# Patient Record
Sex: Female | Born: 1945 | Race: Black or African American | Hispanic: No | Smoking: Never smoker
Health system: Southern US, Community
[De-identification: ages and names within clinical notes are randomized; demographics above are authoritative.]

## PROBLEM LIST (undated history)

## (undated) DIAGNOSIS — F419 Anxiety disorder, unspecified: Secondary | ICD-10-CM

## (undated) DIAGNOSIS — E785 Hyperlipidemia, unspecified: Secondary | ICD-10-CM

## (undated) DIAGNOSIS — I701 Atherosclerosis of renal artery: Secondary | ICD-10-CM

## (undated) DIAGNOSIS — I10 Essential (primary) hypertension: Secondary | ICD-10-CM

## (undated) DIAGNOSIS — M1990 Unspecified osteoarthritis, unspecified site: Secondary | ICD-10-CM

## (undated) HISTORY — DX: Hyperlipidemia, unspecified: E78.5

## (undated) HISTORY — DX: Essential (primary) hypertension: I10

---

## 1988-04-19 HISTORY — PX: ABDOMINAL HYSTERECTOMY: SHX81

## 2001-07-21 ENCOUNTER — Other Ambulatory Visit: Admission: RE | Admit: 2001-07-21 | Discharge: 2001-07-21 | Payer: Self-pay | Admitting: Family Medicine

## 2001-10-09 DIAGNOSIS — G4700 Insomnia, unspecified: Secondary | ICD-10-CM | POA: Insufficient documentation

## 2001-10-09 DIAGNOSIS — I10 Essential (primary) hypertension: Secondary | ICD-10-CM | POA: Insufficient documentation

## 2006-08-02 ENCOUNTER — Ambulatory Visit: Payer: Self-pay | Admitting: General Surgery

## 2007-02-21 DIAGNOSIS — E782 Mixed hyperlipidemia: Secondary | ICD-10-CM | POA: Insufficient documentation

## 2007-07-19 ENCOUNTER — Ambulatory Visit: Payer: Self-pay | Admitting: Internal Medicine

## 2009-07-29 DIAGNOSIS — J309 Allergic rhinitis, unspecified: Secondary | ICD-10-CM | POA: Insufficient documentation

## 2009-10-09 DIAGNOSIS — E559 Vitamin D deficiency, unspecified: Secondary | ICD-10-CM | POA: Insufficient documentation

## 2010-01-17 HISTORY — PX: CARPAL TUNNEL RELEASE: SHX101

## 2013-09-06 ENCOUNTER — Ambulatory Visit: Payer: Self-pay | Admitting: Family Medicine

## 2014-12-31 ENCOUNTER — Encounter: Payer: Self-pay | Admitting: Family Medicine

## 2014-12-31 ENCOUNTER — Ambulatory Visit (INDEPENDENT_AMBULATORY_CARE_PROVIDER_SITE_OTHER): Payer: Commercial Managed Care - HMO | Admitting: Family Medicine

## 2014-12-31 VITALS — BP 116/78 | HR 68 | Temp 98.5°F | Resp 16 | Ht 62.0 in | Wt 177.0 lb

## 2014-12-31 DIAGNOSIS — J3089 Other allergic rhinitis: Secondary | ICD-10-CM | POA: Diagnosis not present

## 2014-12-31 DIAGNOSIS — R739 Hyperglycemia, unspecified: Secondary | ICD-10-CM

## 2014-12-31 DIAGNOSIS — R7989 Other specified abnormal findings of blood chemistry: Secondary | ICD-10-CM | POA: Diagnosis not present

## 2014-12-31 DIAGNOSIS — G5600 Carpal tunnel syndrome, unspecified upper limb: Secondary | ICD-10-CM | POA: Insufficient documentation

## 2014-12-31 DIAGNOSIS — I10 Essential (primary) hypertension: Secondary | ICD-10-CM

## 2014-12-31 DIAGNOSIS — E782 Mixed hyperlipidemia: Secondary | ICD-10-CM | POA: Diagnosis not present

## 2014-12-31 DIAGNOSIS — H8110 Benign paroxysmal vertigo, unspecified ear: Secondary | ICD-10-CM | POA: Insufficient documentation

## 2014-12-31 NOTE — Progress Notes (Signed)
Subjective:    Patient ID: Marissa Hahn, female    DOB: [birth year], 69 y.o.   MRN: [id number]  Hypertension This is a chronic problem. The problem is controlled. Associated symptoms include malaise/fatigue. Pertinent negatives include no anxiety, blurred vision, chest pain, headaches, neck pain, orthopnea, palpitations, peripheral edema, shortness of breath or sweats. Treatments tried: Amlodipine and Ziac. There are no compliance problems.   Hyperlipidemia This is a chronic problem. Lipid results: 08/24/2013 Total- 170, Trig- 104, HDL- 45, LDL- 104. Pertinent negatives include no chest pain, focal sensory loss, focal weakness, leg pain, myalgias or shortness of breath. Current antihyperlipidemic treatment includes statins (Simvastatin 10). There are no compliance problems.   Allergic Rhinitis: Marissa Hahn is here for evaluation of possible allergic rhinitis. Patient's symptoms include cough, nasal congestion and sinus pressure. These symptoms are seasonal. Current triggers include exposure to weather changes. The patient has been suffering from these symptoms for approximately 10 years. The patient has tried over the counter medications with good relief of symptoms.  Does not think it needs to be adjusted.  Does not need medication.   No current concerns.      Review of Systems  Constitutional: Positive for malaise/fatigue. Negative for fever, chills, diaphoresis, activity change, appetite change, fatigue and unexpected weight change.  HENT: Positive for sinus pressure.   Eyes: Negative for blurred vision.  Respiratory: Positive for cough. Negative for shortness of breath.   Cardiovascular: Negative for chest pain, palpitations, orthopnea and leg swelling.  Musculoskeletal: Negative for myalgias and neck pain.  Allergic/Immunologic: Positive for environmental allergies.  Neurological: Negative for focal weakness and headaches.    Patient Active Problem List   Diagnosis Date  Noted  . Benign paroxysmal positional nystagmus 12/31/2014  . Carpal tunnel syndrome 12/31/2014  . Blood glucose elevated 12/31/2014  . Avitaminosis D 10/09/2009  . Allergic rhinitis 07/29/2009  . 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 BP (high blood pressure) 10/09/2001  . Cannot sleep 10/09/2001   No past medical history on file. No current outpatient prescriptions on file prior to visit.   No current facility-administered medications on file prior to visit.   Allergies  Allergen Reactions  . Acetaminophen-Codeine    Past Surgical History  Procedure Laterality Date  . Carpal tunnel release Left 01/2010  . Abdominal hysterectomy  1990    Abdominal   Social History   Social History  . Marital Status: Divorced    Spouse Name: N/A  . Number of Children: 2  . Years of Education: H/S   Occupational History  . Retired    Social History Main Topics  . Smoking status: Never Smoker   . Smokeless tobacco: Never Used  . Alcohol Use: Yes     Comment: Occasional Wine  . Drug Use: No  . Sexual Activity: Not on file   Other Topics Concern  . Not on file   Social History Narrative   Family History  Problem Relation Age of Onset  . Hypertension Mother   . Lung cancer Father   . Healthy Other          Objective:   Physical Exam  Constitutional: She is oriented to person, place, and time. She appears well-developed and well-nourished.  Cardiovascular: Normal rate and regular rhythm.   Pulmonary/Chest: Effort normal and breath sounds normal.  Neurological: She is alert and oriented to person, place, and time.  Psychiatric: She has a normal mood and affect. Her behavior is normal. Judgment  and thought content normal.   BP 116/78 mmHg  Pulse 68  Temp(Src) 98.5 F (36.9 C) (Oral)  Resp 16  Ht 5\' 2"  (1.575 m)  Wt 177 lb (80.287 kg)  BMI 32.37 kg/m2      Assessment & Plan:  1. Essential hypertension Condition is stable. Please continue  current medication and  plan of care as noted.  Will check labs. She does not think medication is causing her fatigue.  - CBC with Differential/Platelet - TSH  2. Blood glucose elevated Due for recheck.  - Hemoglobin A1c  3. Combined fat and carbohydrate induced hyperlipemia Condition is stable. Please continue current medication and  plan of care as noted.  Also needs labs.  - Lipid panel - Comprehensive metabolic panel  4. Other allergic rhinitis Hopefully will improve now that has restarted all medication.    5. Low serum vitamin D Will check lever. Unclear if felt better on medication.  - Vit D  25 hydroxy (rtn osteoporosis monitoring)   Patient was seen and examined by Leo Grosser, MD, and note scribed, in part,  by Allene Dillon, CMA and reviewed with patient.  I have reviewed the document for accuracy and completeness and I agree with above. Leo Grosser, MD   Lorie Phenix, MD

## 2015-01-03 DIAGNOSIS — E782 Mixed hyperlipidemia: Secondary | ICD-10-CM | POA: Diagnosis not present

## 2015-01-03 DIAGNOSIS — R739 Hyperglycemia, unspecified: Secondary | ICD-10-CM | POA: Diagnosis not present

## 2015-01-03 DIAGNOSIS — R7989 Other specified abnormal findings of blood chemistry: Secondary | ICD-10-CM | POA: Diagnosis not present

## 2015-01-03 DIAGNOSIS — I10 Essential (primary) hypertension: Secondary | ICD-10-CM | POA: Diagnosis not present

## 2015-01-04 LAB — LIPID PANEL
CHOLESTEROL TOTAL: 181 mg/dL (ref 100–199)
Chol/HDL Ratio: 3.5 ratio units (ref 0.0–4.4)
HDL: 52 mg/dL (ref >39)
LDL Calculated: 112 mg/dL — ABNORMAL HIGH (ref 0–99)
Triglycerides: 87 mg/dL (ref 0–149)
VLDL CHOLESTEROL CAL: 17 mg/dL (ref 5–40)

## 2015-01-04 LAB — CBC WITH DIFFERENTIAL/PLATELET
BASOS ABS: 0 10*3/uL (ref 0.0–0.2)
Basos: 0 %
EOS (ABSOLUTE): 0.3 10*3/uL (ref 0.0–0.4)
Eos: 3 %
HEMOGLOBIN: 12.6 g/dL (ref 11.1–15.9)
Hematocrit: 39 % (ref 34.0–46.6)
Immature Grans (Abs): 0 10*3/uL (ref 0.0–0.1)
Immature Granulocytes: 0 %
LYMPHS ABS: 3.9 10*3/uL — AB (ref 0.7–3.1)
Lymphs: 39 %
MCH: 27 pg (ref 26.6–33.0)
MCHC: 32.3 g/dL (ref 31.5–35.7)
MCV: 84 fL (ref 79–97)
MONOCYTES: 7 %
Monocytes Absolute: 0.7 10*3/uL (ref 0.1–0.9)
Neutrophils Absolute: 5.2 10*3/uL (ref 1.4–7.0)
Neutrophils: 51 %
PLATELETS: 378 10*3/uL (ref 150–379)
RBC: 4.67 x10E6/uL (ref 3.77–5.28)
RDW: 13.8 % (ref 12.3–15.4)
WBC: 10.1 10*3/uL (ref 3.4–10.8)

## 2015-01-04 LAB — COMPREHENSIVE METABOLIC PANEL
ALBUMIN: 4 g/dL (ref 3.6–4.8)
ALK PHOS: 92 IU/L (ref 39–117)
ALT: 13 IU/L (ref 0–32)
AST: 17 IU/L (ref 0–40)
Albumin/Globulin Ratio: 1.3 (ref 1.1–2.5)
BUN / CREAT RATIO: 11 (ref 11–26)
BUN: 8 mg/dL (ref 8–27)
Bilirubin Total: 0.3 mg/dL (ref 0.0–1.2)
CO2: 22 mmol/L (ref 18–29)
CREATININE: 0.74 mg/dL (ref 0.57–1.00)
Calcium: 9.6 mg/dL (ref 8.7–10.3)
Chloride: 101 mmol/L (ref 97–108)
GFR calc non Af Amer: 85 mL/min/{1.73_m2} (ref >59)
GFR, EST AFRICAN AMERICAN: 98 mL/min/{1.73_m2} (ref >59)
GLOBULIN, TOTAL: 3.1 g/dL (ref 1.5–4.5)
GLUCOSE: 104 mg/dL — AB (ref 65–99)
Potassium: 4 mmol/L (ref 3.5–5.2)
SODIUM: 140 mmol/L (ref 134–144)
TOTAL PROTEIN: 7.1 g/dL (ref 6.0–8.5)

## 2015-01-04 LAB — VITAMIN D 25 HYDROXY (VIT D DEFICIENCY, FRACTURES): VIT D 25 HYDROXY: 17 ng/mL — AB (ref 30.0–100.0)

## 2015-01-04 LAB — HEMOGLOBIN A1C
ESTIMATED AVERAGE GLUCOSE: 128 mg/dL
Hgb A1c MFr Bld: 6.1 % — ABNORMAL HIGH (ref 4.8–5.6)

## 2015-01-04 LAB — TSH: TSH: 2.24 u[IU]/mL (ref 0.450–4.500)

## 2015-01-06 ENCOUNTER — Telehealth: Payer: Self-pay

## 2015-01-06 NOTE — Telephone Encounter (Signed)
-----   Message from Lorie Phenix, MD sent at 01/05/2015  8:18 AM EDT ----- Labs stable. HgbA1c is slightly above normal, eat healthy and  Cholesterol looks good. Only abnormality is low Vitamin D.  Please clarify if she is taking any supplements. Thanks.

## 2015-01-06 NOTE — Telephone Encounter (Signed)
Vit D 2000 iu daily. Thanks.

## 2015-01-06 NOTE — Telephone Encounter (Signed)
Pt advise;  She is not taking any Vitamin D supplements.  How many units do you want her to start on?  Thanks,   -Vernona Rieger

## 2015-01-07 NOTE — Telephone Encounter (Signed)
Pt advised.   Thanks,   -Laura  

## 2015-03-25 ENCOUNTER — Ambulatory Visit (INDEPENDENT_AMBULATORY_CARE_PROVIDER_SITE_OTHER): Payer: Commercial Managed Care - HMO | Admitting: Family Medicine

## 2015-03-25 ENCOUNTER — Encounter: Payer: Self-pay | Admitting: Family Medicine

## 2015-03-25 VITALS — BP 138/70 | HR 64 | Temp 98.8°F | Resp 16 | Ht 62.0 in | Wt 183.0 lb

## 2015-03-25 DIAGNOSIS — Z23 Encounter for immunization: Secondary | ICD-10-CM | POA: Diagnosis not present

## 2015-03-25 DIAGNOSIS — Z1239 Encounter for other screening for malignant neoplasm of breast: Secondary | ICD-10-CM | POA: Diagnosis not present

## 2015-03-25 DIAGNOSIS — Z0000 Encounter for general adult medical examination without abnormal findings: Secondary | ICD-10-CM

## 2015-03-25 NOTE — Progress Notes (Signed)
Patient ID: Marissa Hahn, female   DOB: 08-29-1945, 69 y.o.   MRN: [id number]         [name], Female    DOB: 07/23/1945, 69 y.o.   MRN: [id number] Visit Date: 03/25/2015  Today's Provider: Lorie Phenix, MD   Chief Complaint  Patient presents with  . Annual Exam   Subjective:    Annual wellness visit Marissa Hahn is a 69 y.o. female. She feels well. She reports exercising occasionally. She reports she is sleeping fairly well.  Does need her pneumonia shot today. Chronic problems stable.   -----------------------------------------------------------   Review of Systems  Constitutional: Negative.   HENT: Negative for congestion, dental problem, drooling, ear discharge, ear pain, facial swelling, hearing loss, mouth sores, nosebleeds, postnasal drip, rhinorrhea, sinus pressure, sneezing, sore throat, tinnitus, trouble swallowing and voice change.   Eyes: Negative.   Respiratory: Negative.   Cardiovascular: Negative.   Endocrine: Negative.   Genitourinary: Negative.   Musculoskeletal: Positive for myalgias and arthralgias. Negative for back pain, joint swelling, gait problem, neck pain and neck stiffness.  Skin: Negative.   Allergic/Immunologic: Negative.   Neurological: Positive for headaches. Negative for dizziness, tremors, seizures, syncope, facial asymmetry, speech difficulty, weakness, light-headedness and numbness.  Hematological: Negative.   Psychiatric/Behavioral: Positive for sleep disturbance. Negative for suicidal ideas, hallucinations, behavioral problems, confusion, self-injury, dysphoric mood, decreased concentration and agitation. The patient is not nervous/anxious and is not hyperactive.     Social History   Social History  . Marital Status: Divorced    Spouse Name: N/A  . Number of Children: 2  . Years of Education: H/S   Occupational History  . Retired    Social History Main Topics  . Smoking status: Never Smoker   .  Smokeless tobacco: Never Used  . Alcohol Use: Yes     Comment: Occasional Wine  . Drug Use: No  . Sexual Activity: Not on file   Other Topics Concern  . Not on file   Social History Narrative    Patient Active Problem List   Diagnosis Date Noted  . Benign paroxysmal positional nystagmus 12/31/2014  . Carpal tunnel syndrome 12/31/2014  . Blood glucose elevated 12/31/2014  . Avitaminosis D 10/09/2009  . Allergic rhinitis 07/29/2009  . 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 BP (high blood pressure) 10/09/2001  . Cannot sleep 10/09/2001    Past Surgical History  Procedure Laterality Date  . Carpal tunnel release Left 01/2010  . Abdominal hysterectomy  1990    Abdominal    Her family history includes Healthy in her other; Hypertension in her mother; Lung cancer in her father.    Previous Medications   AMLODIPINE (NORVASC) 5 MG TABLET    Take by mouth.   BISOPROLOL-HYDROCHLOROTHIAZIDE (ZIAC) 10-6.25 MG PER TABLET    Take by mouth.   FLUTICASONE (FLONASE) 50 MCG/ACT NASAL SPRAY    Place into the nose.   LORATADINE (CLARITIN) 10 MG TABLET    Take 10 mg by mouth daily.   MELOXICAM (MOBIC) 15 MG TABLET    Take by mouth.   SIMVASTATIN (ZOCOR) 10 MG TABLET    Take by mouth.   ZOLPIDEM (AMBIEN) 5 MG TABLET        Patient Care Team: Lorie Phenix, MD as PCP - General (Family Medicine)     Objective:   Vitals: BP 138/70 mmHg  Pulse 64  Temp(Src) 98.8 F (37.1 C) (Oral)  Resp 16  Ht 5'  2" (1.575 m)  Wt 183 lb (83.008 kg)  BMI 33.46 kg/m2  Physical Exam  Constitutional: She is oriented to person, place, and time. She appears well-developed and well-nourished.  HENT:  Head: Normocephalic and atraumatic.  Right Ear: Tympanic membrane, external ear and ear canal normal.  Left Ear: Tympanic membrane, external ear and ear canal normal.  Nose: Nose normal.  Mouth/Throat: Uvula is midline, oropharynx is clear and moist and mucous membranes are  normal.  Eyes: Conjunctivae, EOM and lids are normal. Pupils are equal, round, and reactive to light.  Neck: Trachea normal and normal range of motion. Neck supple. Carotid bruit is not present. No thyroid mass and no thyromegaly present.  Cardiovascular: Normal rate, regular rhythm and normal heart sounds.   Pulmonary/Chest: Effort normal and breath sounds normal.  Abdominal: Soft. Normal appearance and bowel sounds are normal. There is no hepatosplenomegaly. There is no tenderness.  Genitourinary: Guaiac negative stool. No breast swelling, tenderness or discharge.  Musculoskeletal: Normal range of motion.  Lymphadenopathy:    She has no cervical adenopathy.    She has no axillary adenopathy.  Neurological: She is alert and oriented to person, place, and time. She has normal strength. No cranial nerve deficit.  Skin: Skin is warm, dry and intact.  Psychiatric: She has a normal mood and affect. Her speech is normal and behavior is normal. Judgment and thought content normal. Cognition and memory are normal.    Activities of Daily Living In your present state of health, do you have any difficulty performing the following activities: 03/25/2015  Hearing? N  Vision? N  Difficulty concentrating or making decisions? N  Walking or climbing stairs? N  Dressing or bathing? N  Doing errands, shopping? N    Fall Risk Assessment Fall Risk  03/25/2015  Falls in the past year? No     Depression Screen PHQ 2/9 Scores 03/25/2015  PHQ - 2 Score 1    Cognitive Testing - 6-CIT  Correct? Score   What year is it? yes 0 0 or 4  What month is it? yes 0 0 or 3  Memorize:    Floyde Parkins,  44,  [address],  [place],      What time is it? (within 1 hour) yes 0 0 or 3  Count backwards from 20 no 3 0, 2, or 4  Name the months of the year yes 0 0, 2, or 4  Repeat name & address above yes 0 0, 2, 4, 6, 8, or 10       TOTAL SCORE  3/28   Interpretation:  Normal  Normal (0-7) Abnormal (8-28)        Assessment & Plan:     Annual Wellness Visit  Reviewed patient's Family Medical History Reviewed and updated list of patient's medical providers Assessment of cognitive impairment was done Assessed patient's functional ability Established a written schedule for health screening services Health Risk Assessent Completed and Reviewed  Exercise Activities and Dietary recommendations Goals    None      Immunization History  Administered Date(s) Administered  . Pneumococcal Conjugate-13 08/24/2013  . Pneumococcal Polysaccharide-23 03/25/2015    Health Maintenance  Topic Date Due  . Hepatitis C Screening  Feb 20, 1946  . TETANUS/TDAP  02/25/1967  . MAMMOGRAM  02/24/1998  . COLONOSCOPY  02/24/1998  . ZOSTAVAX  02/25/2008  . DEXA SCAN  02/24/2013  . PNA vac Low Risk Adult (2 of 2 - PPSV23) 08/25/2014  . INFLUENZA VACCINE  03/24/2016 (Originally 11/18/2014)      Discussed health benefits of physical activity, and encouraged her to engage in regular exercise appropriate for her age and condition.   1. Medicare annual wellness visit, subsequent Stable. Continue medication for chronic problems.  Vaccine given today.   - Pneumococcal polysaccharide vaccine 23-valent greater than or equal to 2yo subcutaneous/IM  2. Breast cancer screening Patient will call. - MM Digital Screening  Patient was seen and examined by Leo GrosserNancy J. Corinda Ammon, MD, and note scribed by Kavin LeechLaura Walsh, CMA.  I have reviewed the document for accuracy and completeness and I agree with above. Leo Grosser- Irys Nigh J. Devonte Migues, MD   Lorie PhenixNancy Edra Riccardi, MD    ------------------------------------------------------------------------------------------------------------

## 2015-04-04 ENCOUNTER — Other Ambulatory Visit: Payer: Self-pay | Admitting: Family Medicine

## 2015-04-04 DIAGNOSIS — G4700 Insomnia, unspecified: Secondary | ICD-10-CM

## 2015-04-04 MED ORDER — ZOLPIDEM TARTRATE 5 MG PO TABS: 5.0000 mg | ORAL_TABLET | Freq: Every evening | ORAL | Status: DC | PRN

## 2015-04-04 NOTE — Telephone Encounter (Signed)
Pt contacted office for refill request on the following medications:  zolpidem (AMBIEN) 5 MG tablet.   [facility] mail order.  CB#[phone]

## 2015-04-04 NOTE — Addendum Note (Signed)
Addended by: Kavin LeechWALSH, Niveah Boerner E on: 04/04/2015 02:18 PM   Modules accepted: Orders

## 2015-04-04 NOTE — Telephone Encounter (Signed)
Printed, please fax or call in to pharmacy. Thank you.   

## 2015-09-22 ENCOUNTER — Ambulatory Visit: Payer: Self-pay | Admitting: Family Medicine

## 2015-09-26 ENCOUNTER — Ambulatory Visit (INDEPENDENT_AMBULATORY_CARE_PROVIDER_SITE_OTHER): Payer: Commercial Managed Care - HMO | Admitting: Family Medicine

## 2015-09-26 ENCOUNTER — Encounter: Payer: Self-pay | Admitting: Family Medicine

## 2015-09-26 VITALS — BP 124/70 | HR 64 | Temp 98.3°F | Resp 16 | Ht 60.5 in | Wt 182.0 lb

## 2015-09-26 DIAGNOSIS — I10 Essential (primary) hypertension: Secondary | ICD-10-CM

## 2015-09-26 DIAGNOSIS — E782 Mixed hyperlipidemia: Secondary | ICD-10-CM

## 2015-09-26 DIAGNOSIS — R739 Hyperglycemia, unspecified: Secondary | ICD-10-CM

## 2015-09-26 DIAGNOSIS — G5603 Carpal tunnel syndrome, bilateral upper limbs: Secondary | ICD-10-CM

## 2015-09-26 DIAGNOSIS — G4700 Insomnia, unspecified: Secondary | ICD-10-CM

## 2015-09-26 LAB — POCT GLYCOSYLATED HEMOGLOBIN (HGB A1C)
Est. average glucose Bld gHb Est-mCnc: 123
Hemoglobin A1C: 5.9

## 2015-09-26 MED ORDER — MELOXICAM 7.5 MG PO TABS: 7.5000 mg | ORAL_TABLET | Freq: Every day | ORAL | Status: DC

## 2015-09-26 MED ORDER — SIMVASTATIN 10 MG PO TABS
10.0000 mg | ORAL_TABLET | Freq: Every day | ORAL | Status: DC
Start: 2015-09-26 — End: 2016-08-23

## 2015-09-26 MED ORDER — BISOPROLOL-HYDROCHLOROTHIAZIDE 10-6.25 MG PO TABS: 1.0000 | ORAL_TABLET | Freq: Every day | ORAL | Status: DC

## 2015-09-26 MED ORDER — AMLODIPINE BESYLATE 5 MG PO TABS: 5.0000 mg | ORAL_TABLET | Freq: Every day | ORAL | Status: DC

## 2015-09-26 MED ORDER — ZOLPIDEM TARTRATE 5 MG PO TABS: 5.0000 mg | ORAL_TABLET | Freq: Every evening | ORAL | Status: DC | PRN

## 2015-09-26 NOTE — Progress Notes (Signed)
Patient ID: Marissa Hahn, female   DOB: [birth year], 70 y.o.   MRN: [id number]       [name] Female    DOB: 08-Nov-1945   70 y.o.   MRN: [id number] Visit Date: 09/26/2015  Today's Provider: Lorie Phenix, MD   Chief Complaint  Patient presents with  . Hyperglycemia  . Hypertension  . Hyperlipidemia   Subjective:    HPI  Elevated blood sugar:  Lab Results  Component Value Date   HGBA1C 5.9 09/26/2015   HGBA1C 6.1* 01/03/2015   Last seen for diabetes 9 months ago.  Management since then includes no changes. Current symptoms include none and have been stable. Home blood sugar records: fasting range: not being checked  Episodes of hypoglycemia? no   Most Recent Eye Exam: up to date Weight trend: stable Prior visit with dietician: no Current diet: in general, a "healthy" diet   Current exercise: walking  ------------------------------------------------------------------------   Hypertension, follow-up:  BP Readings from Last 3 Encounters:  09/26/15 124/70  03/25/15 138/70  12/31/14 116/78    She was last seen for hypertension 9 months ago.  BP at that visit was 138/70. Management since that visit includes no changes .She reports excellent compliance with treatment. She is not having side effects. She is exercising. She is adherent to low salt diet.   Outside blood pressures are stable. She is experiencing none.  Patient denies chest pain.   Cardiovascular risk factors include advanced age (older than 77 for men, 70 for women).  Use of agents associated with hypertension: none.   ------------------------------------------------------------------------    Lipid/Cholesterol, Follow-up:   Last seen for this 3 months ago.  Management since that visit includes no changes.  Last Lipid Panel:    Component Value Date/Time   CHOL 181 01/03/2015 0907   TRIG 87 01/03/2015 0907   HDL 52 01/03/2015 0907   CHOLHDL 3.5 01/03/2015 0907   LDLCALC 112* 01/03/2015 0907    She reports excellent compliance with treatment. She is not having side effects.  Wt Readings from Last 3 Encounters:  09/26/15 182 lb (82.555 kg)  03/25/15 183 lb (83.008 kg)  12/31/14 177 lb (80.287 kg)   ------------------------------------------------------------------------  Hand pain: Patient reports bilateral pain worsening. Patient reports that she was unable to take meloxicam 15 mg. Patient reports that she takes OTC Tylenol as needed.     Allergies  Allergen Reactions  . Acetaminophen-Codeine    Current Meds  Medication Sig  . amLODipine (NORVASC) 5 MG tablet Take 5 mg by mouth daily.   . bisoprolol-hydrochlorothiazide (ZIAC) 10-6.25 MG per tablet Take 1 tablet by mouth daily.   . fluticasone (FLONASE) 50 MCG/ACT nasal spray Place 2 sprays into the nose daily.   Marland Kitchen loratadine (CLARITIN) 10 MG tablet Take 10 mg by mouth daily as needed.   . simvastatin (ZOCOR) 10 MG tablet Take 10 mg by mouth daily at 6 PM.   . Vitamin D, Cholecalciferol, 1000 units CAPS Take 1 capsule by mouth daily.  Marland Kitchen zolpidem (AMBIEN) 5 MG tablet Take 1 tablet (5 mg total) by mouth at bedtime as needed for sleep.    Review of Systems  Constitutional: Negative.   Cardiovascular: Negative.   Endocrine: Negative.   Musculoskeletal: Positive for arthralgias.    Social History  Substance Use Topics  . Smoking status: Never Smoker   . Smokeless tobacco: Never Used  . Alcohol Use: Yes     Comment: Occasional Wine   Objective:  BP 124/70 mmHg  Pulse 64  Temp(Src) 98.3 F (36.8 C) (Oral)  Resp 16  Ht 5' 0.5" (1.537 m)  Wt 182 lb (82.555 kg)  BMI 34.95 kg/m2  Physical Exam  Constitutional: She is oriented to person, place, and time. She appears well-developed and well-nourished.  Cardiovascular: Normal rate and regular rhythm.   Pulmonary/Chest: Effort normal and breath sounds normal.  Musculoskeletal:  No joint erythema noted.   Neurological: She is  alert and oriented to person, place, and time.  Psychiatric: She has a normal mood and affect. Her behavior is normal. Judgment and thought content normal.      Assessment & Plan:     1. Blood glucose elevated Stable. Continue to eat healthy and exercise.   - POCT glycosylated hemoglobin (Hb A1C)  2. Essential hypertension Condition is stable. Please continue current medication and  plan of care as noted.   - bisoprolol-hydrochlorothiazide (ZIAC) 10-6.25 MG tablet; Take 1 tablet by mouth daily.  Dispense: 90 tablet; Refill: 3 - amLODipine (NORVASC) 5 MG tablet; Take 1 tablet (5 mg total) by mouth daily.  Dispense: 90 tablet; Refill: 3  3. Combined fat and carbohydrate induced hyperlipemia Condition is stable. Please continue current medication and  plan of care as noted.   - simvastatin (ZOCOR) 10 MG tablet; Take 1 tablet (10 mg total) by mouth daily at 6 PM.  Dispense: 90 tablet; Refill: 3  4. Cannot sleep Condition is stable. Please continue current medication and  plan of care as noted.   - zolpidem (AMBIEN) 5 MG tablet; Take 1 tablet (5 mg total) by mouth at bedtime as needed for sleep.  Dispense: 90 tablet; Refill: 1  5. Bilateral carpal tunnel syndrome Could not tolerate 15 mg, tylenol makes her itchy. Will try bid as needed. Patient instructed to call back if condition worsens or does not improve.    - meloxicam (MOBIC) 7.5 MG tablet; Take 1 tablet (7.5 mg total) by mouth daily.  Dispense: 90 tablet; Refill: 3     Patient seen and examined by Dr. Leo GrosserNancy J.. Stefanos Haynesworth, and note scribed by Liz BeachSulibeya S. Dimas, CMA.  I have reviewed the document for accuracy and completeness and I agree with above. - Leo GrosserNancy J. Avi Archuleta, MD   Lorie PhenixNancy Analissa Bayless, MD  [facility]

## 2015-09-29 ENCOUNTER — Other Ambulatory Visit: Payer: Self-pay | Admitting: Family Medicine

## 2015-09-29 DIAGNOSIS — G5603 Carpal tunnel syndrome, bilateral upper limbs: Secondary | ICD-10-CM

## 2015-09-29 DIAGNOSIS — G4700 Insomnia, unspecified: Secondary | ICD-10-CM

## 2015-09-29 MED ORDER — ZOLPIDEM TARTRATE 5 MG PO TABS: 5.0000 mg | ORAL_TABLET | Freq: Every evening | ORAL | Status: DC | PRN

## 2015-09-29 MED ORDER — MELOXICAM 7.5 MG PO TABS: 7.5000 mg | ORAL_TABLET | Freq: Every day | ORAL | Status: DC

## 2015-09-29 MED ORDER — FLUTICASONE PROPIONATE 50 MCG/ACT NA SUSP: 2.0000 | Freq: Every day | NASAL | Status: DC

## 2015-09-29 NOTE — Telephone Encounter (Signed)
meloxicam (MOBIC) 7.5 MG tablet fluticasone (FLONASE) 50 MCG/ACT nasal spray zolpidem (AMBIEN) 5 MG tablet Pt states the above 3 meds were not called in for refill to the [facility]

## 2015-09-29 NOTE — Telephone Encounter (Signed)
Printed, please fax or call in to pharmacy. Thank you.   

## 2016-03-02 ENCOUNTER — Other Ambulatory Visit: Payer: Self-pay | Admitting: Family Medicine

## 2016-03-02 DIAGNOSIS — G4700 Insomnia, unspecified: Secondary | ICD-10-CM

## 2016-03-29 ENCOUNTER — Ambulatory Visit: Payer: Self-pay | Admitting: Physician Assistant

## 2016-04-07 ENCOUNTER — Ambulatory Visit: Payer: Commercial Managed Care - HMO | Admitting: Physician Assistant

## 2016-04-27 ENCOUNTER — Encounter: Payer: Self-pay | Admitting: Physician Assistant

## 2016-04-27 ENCOUNTER — Ambulatory Visit (INDEPENDENT_AMBULATORY_CARE_PROVIDER_SITE_OTHER): Payer: Medicare HMO | Admitting: Physician Assistant

## 2016-04-27 VITALS — BP 142/80 | HR 60 | Temp 98.3°F | Resp 16 | Ht 62.0 in | Wt 185.0 lb

## 2016-04-27 DIAGNOSIS — I10 Essential (primary) hypertension: Secondary | ICD-10-CM

## 2016-04-27 DIAGNOSIS — E782 Mixed hyperlipidemia: Secondary | ICD-10-CM

## 2016-04-27 DIAGNOSIS — R739 Hyperglycemia, unspecified: Secondary | ICD-10-CM

## 2016-04-27 LAB — POCT GLYCOSYLATED HEMOGLOBIN (HGB A1C)
Est. average glucose Bld gHb Est-mCnc: 123
Hemoglobin A1C: 5.9

## 2016-04-27 NOTE — Progress Notes (Signed)
[name] Female    DOB: [birth year]   71 y.o.   MRN: [id number] Visit Date: 04/27/2016  Today's Provider: Margaretann Loveless, PA-C   Chief Complaint  Patient presents with  . Hypertension   Subjective:    HPI Patient here to establish care with [name], PA-C, last ov visit was on 09/26/15 with Dr. Elease Hashimoto.   Hypertension, follow-up:  BP Readings from Last 3 Encounters:  04/27/16 (!) 142/80  09/26/15 124/70  03/25/15 138/70    She was last seen for hypertension 7 months ago.  BP at that visit was 124/70. Management changes since that visit include checking labs. She reports excellent compliance with treatment. She is not having side effects.  She is not exercising. She is adherent to low salt diet.   Outside blood pressures are stable. She is experiencing none.  Patient denies chest pain and lower extremity edema.   Cardiovascular risk factors include advanced age (older than 48 for men, 64 for women) and hypertension.  Use of agents associated with hypertension: none.     Weight trend: stable Wt Readings from Last 3 Encounters:  04/27/16 185 lb (83.9 kg)  09/26/15 182 lb (82.6 kg)  03/25/15 183 lb (83 kg)    Current diet: in general, a "healthy" diet    ------------------------------------------------------------------------      Lipid/Cholesterol, Follow-up:   Last seen for this7 months ago.  Management changes since that visit include no changes. . Last Lipid Panel:    Component Value Date/Time   CHOL 181 01/03/2015 0907   TRIG 87 01/03/2015 0907   HDL 52 01/03/2015 0907   CHOLHDL 3.5 01/03/2015 0907   LDLCALC 112 (H) 01/03/2015 0907    Risk factors for vascular disease include hypercholesterolemia and hypertension  She reports excellent compliance with treatment. She is not having side effects.  Current symptoms include none and have been stable. Weight trend: stable Prior visit with dietician: no Current  diet: in general, a "healthy" diet   Current exercise: none  Wt Readings from Last 3 Encounters:  04/27/16 185 lb (83.9 kg)  09/26/15 182 lb (82.6 kg)  03/25/15 183 lb (83 kg)  -------------------------------------------------------------------   Prediabetes, Follow-up:   Lab Results  Component Value Date   HGBA1C 5.9 09/26/2015   HGBA1C 6.1 (H) 01/03/2015   GLUCOSE 104 (H) 01/03/2015    Last seen for for this7 months ago.  Management since that visit includes no changes. Current symptoms include none and have been stable.  Weight trend: stable Prior visit with dietician: no Current diet: in general, a "healthy" diet   Current exercise: none  Pertinent Labs:    Component Value Date/Time   CHOL 181 01/03/2015 0907   TRIG 87 01/03/2015 0907   CHOLHDL 3.5 01/03/2015 0907   CREATININE 0.74 01/03/2015 0907    Wt Readings from Last 3 Encounters:  04/27/16 185 lb (83.9 kg)  09/26/15 182 lb (82.6 kg)  03/25/15 183 lb (83 kg)     Allergies  Allergen Reactions  . Acetaminophen-Codeine      Current Outpatient Prescriptions:  .  amLODipine (NORVASC) 5 MG tablet, Take 1 tablet (5 mg total) by mouth daily., Disp: 90 tablet, Rfl: 3 .  bisoprolol-hydrochlorothiazide (ZIAC) 10-6.25 MG tablet, Take 1 tablet by mouth daily., Disp: 90 tablet, Rfl: 3 .  fluticasone (FLONASE) 50 MCG/ACT nasal spray, Place 2 sprays into both nostrils daily., Disp: 48 g, Rfl: 3 .  loratadine (CLARITIN) 10 MG  tablet, Take 10 mg by mouth daily as needed. , Disp: , Rfl:  .  meloxicam (MOBIC) 7.5 MG tablet, Take 1 tablet (7.5 mg total) by mouth daily., Disp: 90 tablet, Rfl: 3 .  simvastatin (ZOCOR) 10 MG tablet, Take 1 tablet (10 mg total) by mouth daily at 6 PM., Disp: 90 tablet, Rfl: 3 .  Vitamin D, Cholecalciferol, 1000 units CAPS, Take 1 capsule by mouth daily., Disp: , Rfl:  .  zolpidem (AMBIEN) 5 MG tablet, TAKE 1 TABLET AT BEDTIME AS NEEDED FOR SLEEP, Disp: 90 tablet, Rfl: 1  Review of Systems    Constitutional: Negative.   HENT: Negative.   Respiratory: Negative.   Cardiovascular: Negative.   Gastrointestinal: Negative.   Neurological: Negative.     Social History  Substance Use Topics  . Smoking status: Never Smoker  . Smokeless tobacco: Never Used  . Alcohol use Yes     Comment: Occasional Wine   Objective:   BP (!) 142/80 (BP Location: Left Arm, Patient Position: Sitting, Cuff Size: Large)   Pulse 60   Temp 98.3 F (36.8 C) (Oral)   Resp 16   Ht 5\' 2"  (1.575 m)   Wt 185 lb (83.9 kg)   BMI 33.84 kg/m   Physical Exam  Constitutional: She appears well-developed and well-nourished. No distress.  Neck: Normal range of motion. Neck supple.  Cardiovascular: Normal rate, regular rhythm and normal heart sounds.  Exam reveals no gallop and no friction rub.   No murmur heard. Pulmonary/Chest: Effort normal and breath sounds normal. No respiratory distress. She has no wheezes. She has no rales.  Musculoskeletal: She exhibits no edema.  Skin: She is not diaphoretic.  Vitals reviewed.     Assessment & Plan:     1. Blood glucose elevated A1c stable today at 5.9. She is to continue with lifestyle modifications. I will see her back in approximately 5 months for her annual wellness and we will check all labs at that time. - POCT glycosylated hemoglobin (Hb A1C)  2. Essential hypertension Stable. Continue amlodipine 5 mg, Ziac 10-6.25 mg as previously prescribed.  3. Combined fat and carbohydrate induced hyperlipemia Previously stable. Patient does not wish to check labs today as she is not fasting. We will check all labs in 5 months and her annual wellness. Continue simvastatin 10 mg daily.       Margaretann LovelessJennifer M Knolan Simien, PA-C  [facility]

## 2016-04-27 NOTE — Patient Instructions (Signed)
DASH Eating Plan DASH stands for "Dietary Approaches to Stop Hypertension." The DASH eating plan is a healthy eating plan that has been shown to reduce high blood pressure (hypertension). Additional health benefits may include reducing the risk of type 2 diabetes mellitus, heart disease, and stroke. The DASH eating plan may also help with weight loss. What do I need to know about the DASH eating plan? For the DASH eating plan, you will follow these general guidelines:  Choose foods with less than 150 milligrams of sodium per serving (as listed on the food label).  Use salt-free seasonings or herbs instead of table salt or sea salt.  Check with your health care provider or pharmacist before using salt substitutes.  Eat lower-sodium products. These are often labeled as "low-sodium" or "no salt added."  Eat fresh foods. Avoid eating a lot of canned foods.  Eat more vegetables, fruits, and low-fat dairy products.  Choose whole grains. Look for the word "whole" as the first word in the ingredient list.  Choose fish and skinless chicken or turkey more often than red meat. Limit fish, poultry, and meat to 6 oz (170 g) each day.  Limit sweets, desserts, sugars, and sugary drinks.  Choose heart-healthy fats.  Eat more home-cooked food and less restaurant, buffet, and fast food.  Limit fried foods.  Do not fry foods. Cook foods using methods such as baking, boiling, grilling, and broiling instead.  When eating at a restaurant, ask that your food be prepared with less salt, or no salt if possible. What foods can I eat? Seek help from a dietitian for individual calorie needs. Grains  Whole grain or whole wheat bread. Brown rice. Whole grain or whole wheat pasta. Quinoa, bulgur, and whole grain cereals. Low-sodium cereals. Corn or whole wheat flour tortillas. Whole grain cornbread. Whole grain crackers. Low-sodium crackers. Vegetables  Fresh or frozen vegetables (raw, steamed, roasted, or  grilled). Low-sodium or reduced-sodium tomato and vegetable juices. Low-sodium or reduced-sodium tomato sauce and paste. Low-sodium or reduced-sodium canned vegetables. Fruits  All fresh, canned (in natural juice), or frozen fruits. Meat and Other Protein Products  Ground beef (85% or leaner), grass-fed beef, or beef trimmed of fat. Skinless chicken or turkey. Ground chicken or turkey. Pork trimmed of fat. All fish and seafood. Eggs. Dried beans, peas, or lentils. Unsalted nuts and seeds. Unsalted canned beans. Dairy  Low-fat dairy products, such as skim or 1% milk, 2% or reduced-fat cheeses, low-fat ricotta or cottage cheese, or plain low-fat yogurt. Low-sodium or reduced-sodium cheeses. Fats and Oils  Tub margarines without trans fats. Light or reduced-fat mayonnaise and salad dressings (reduced sodium). Avocado. Safflower, olive, or canola oils. Natural peanut or almond butter. Other  Unsalted popcorn and pretzels. The items listed above may not be a complete list of recommended foods or beverages. Contact your dietitian for more options.  What foods are not recommended? Grains  White bread. White pasta. White rice. Refined cornbread. Bagels and croissants. Crackers that contain trans fat. Vegetables  Creamed or fried vegetables. Vegetables in a cheese sauce. Regular canned vegetables. Regular canned tomato sauce and paste. Regular tomato and vegetable juices. Fruits  Canned fruit in light or heavy syrup. Fruit juice. Meat and Other Protein Products  Fatty cuts of meat. Ribs, chicken wings, bacon, sausage, bologna, salami, chitterlings, fatback, hot dogs, bratwurst, and packaged luncheon meats. Salted nuts and seeds. Canned beans with salt. Dairy  Whole or 2% milk, cream, half-and-half, and cream cheese. Whole-fat or sweetened yogurt. Full-fat cheeses   or blue cheese. Nondairy creamers and whipped toppings. Processed cheese, cheese spreads, or cheese curds. Condiments  Onion and garlic  salt, seasoned salt, table salt, and sea salt. Canned and packaged gravies. Worcestershire sauce. Tartar sauce. Barbecue sauce. Teriyaki sauce. Soy sauce, including reduced sodium. Steak sauce. Fish sauce. Oyster sauce. Cocktail sauce. Horseradish. Ketchup and mustard. Meat flavorings and tenderizers. Bouillon cubes. Hot sauce. Tabasco sauce. Marinades. Taco seasonings. Relishes. Fats and Oils  Butter, stick margarine, lard, shortening, ghee, and bacon fat. Coconut, palm kernel, or palm oils. Regular salad dressings. Other  Pickles and olives. Salted popcorn and pretzels. The items listed above may not be a complete list of foods and beverages to avoid. Contact your dietitian for more information.  Where can I find more information? National Heart, Lung, and Blood Institute: www.nhlbi.nih.gov/health/health-topics/topics/dash/ This information is not intended to replace advice given to you by your health care provider. Make sure you discuss any questions you have with your health care provider. Document Released: 03/25/2011 Document Revised: 09/11/2015 Document Reviewed: 02/07/2013 Elsevier Interactive Patient Education  2017 Elsevier Inc.  

## 2016-07-20 ENCOUNTER — Other Ambulatory Visit: Payer: Self-pay | Admitting: Physician Assistant

## 2016-07-20 DIAGNOSIS — G5603 Carpal tunnel syndrome, bilateral upper limbs: Secondary | ICD-10-CM

## 2016-07-20 DIAGNOSIS — E782 Mixed hyperlipidemia: Secondary | ICD-10-CM

## 2016-07-20 DIAGNOSIS — I10 Essential (primary) hypertension: Secondary | ICD-10-CM

## 2016-07-20 NOTE — Telephone Encounter (Addendum)
[facility] faxed a request for refill on the following medications. Last filled 09/26/15. Thanks CC  amLODipine (NORVASC) 5 MG tablet [ Take 1 Tablet Daily  Simvastatin 10 MG Tab Take 1 Tablet Daily at 6 PM   Bisoprolol/HCTZ Tab 10/6.25 MG Take 1 Tablet Every Day  Meloxicam 7.5 MG Tab Take 1 Tablet Every Day

## 2016-07-21 NOTE — Telephone Encounter (Signed)
Patient is not due for a refill until 09/2016. I called pharmacy and confirmed this. Pharmacy will resend refill request closer to due date.

## 2016-08-20 ENCOUNTER — Other Ambulatory Visit: Payer: Self-pay | Admitting: Physician Assistant

## 2016-08-20 DIAGNOSIS — E782 Mixed hyperlipidemia: Secondary | ICD-10-CM

## 2016-08-20 NOTE — Telephone Encounter (Signed)
[facility] faxed a request of the following medication. Thanks CC  simvastatin (ZOCOR) 10 MG tablet  Take 1 tablet daily at 6 PM

## 2016-08-23 MED ORDER — SIMVASTATIN 10 MG PO TABS: 10.0000 mg | ORAL_TABLET | Freq: Every day | ORAL | 3 refills | Status: DC

## 2016-08-24 ENCOUNTER — Other Ambulatory Visit: Payer: Self-pay | Admitting: Physician Assistant

## 2016-08-24 DIAGNOSIS — G4700 Insomnia, unspecified: Secondary | ICD-10-CM

## 2016-08-25 NOTE — Telephone Encounter (Signed)
Called in prescription for Zolpidem 5 Mg to [facility]. Spoke with the pharmacist.  Thanks,  -Joseline

## 2016-09-14 ENCOUNTER — Other Ambulatory Visit: Payer: Self-pay | Admitting: Physician Assistant

## 2016-09-14 DIAGNOSIS — I10 Essential (primary) hypertension: Secondary | ICD-10-CM

## 2016-09-14 MED ORDER — AMLODIPINE BESYLATE 5 MG PO TABS: 5.0000 mg | ORAL_TABLET | Freq: Every day | ORAL | 3 refills | Status: DC

## 2016-09-14 NOTE — Telephone Encounter (Signed)
[facility] faxed office for refill request on the following medications: amLODipine (NORVASC) 5 MG tablet 90 day supply Last Rx: 09/26/15 90 day supply with 3 refills written by Dr. Elease HashimotoMaloney Last OV: 04/27/16 Next OV: 09/28/16 Please advise. Thanks TNP

## 2016-09-28 ENCOUNTER — Ambulatory Visit: Payer: Medicare HMO | Admitting: Physician Assistant

## 2016-09-28 ENCOUNTER — Ambulatory Visit: Payer: Medicare HMO

## 2016-10-05 ENCOUNTER — Ambulatory Visit (INDEPENDENT_AMBULATORY_CARE_PROVIDER_SITE_OTHER): Payer: Medicare HMO

## 2016-10-05 VITALS — BP 150/82 | HR 64 | Temp 99.4°F | Ht 62.0 in | Wt 190.4 lb

## 2016-10-05 DIAGNOSIS — Z0000 Encounter for general adult medical examination without abnormal findings: Secondary | ICD-10-CM

## 2016-10-05 NOTE — Progress Notes (Signed)
Subjective:   Marissa Hahn is a 71 y.o. female who presents for Medicare Annual (Subsequent) preventive examination.  Review of Systems:  N/A  Cardiac Risk Factors include: advanced age (>2255men, 27>65 women);dyslipidemia;hypertension;obesity (BMI >30kg/m2)     Objective:     Vitals: BP (!) 150/82 (BP Location: Right Arm)   Pulse 64   Temp 99.4 F (37.4 C) (Oral)   Ht 5\' 2"  (1.575 m)   Wt 190 lb 6.4 oz (86.4 kg)   BMI 34.82 kg/m   Body mass index is 34.82 kg/m.   Tobacco History  Smoking Status  . Never Smoker  Smokeless Tobacco  . Never Used     Counseling given: Not Answered   History reviewed. No pertinent past medical history. Past Surgical History:  Procedure Laterality Date  . ABDOMINAL HYSTERECTOMY  1990   Abdominal  . CARPAL TUNNEL RELEASE Left 01/2010   Family History  Problem Relation Age of Onset  . Hypertension Mother   . Lung cancer Father   . Healthy Other    History  Sexual Activity  . Sexual activity: Not on file    Outpatient Encounter Prescriptions as of 10/05/2016  Medication Sig  . amLODipine (NORVASC) 5 MG tablet Take 1 tablet (5 mg total) by mouth daily.  . bisoprolol-hydrochlorothiazide (ZIAC) 10-6.25 MG tablet Take 1 tablet by mouth daily.  . fluticasone (FLONASE) 50 MCG/ACT nasal spray Place 2 sprays into both nostrils daily.  Marland Kitchen. loratadine (CLARITIN) 10 MG tablet Take 10 mg by mouth daily as needed.   . meloxicam (MOBIC) 7.5 MG tablet Take 1 tablet (7.5 mg total) by mouth daily.  . simvastatin (ZOCOR) 10 MG tablet Take 1 tablet (10 mg total) by mouth daily at 6 PM.  . zolpidem (AMBIEN) 5 MG tablet TAKE 1 TABLET AT BEDTIME AS NEEDED FOR SLEEP  . Vitamin D, Cholecalciferol, 1000 units CAPS Take 1 capsule by mouth daily.   No facility-administered encounter medications on file as of 10/05/2016.     Activities of Daily Living In your present state of health, do you have any difficulty performing the following activities:  10/05/2016 04/27/2016  Hearing? N N  Vision? N N  Difficulty concentrating or making decisions? N N  Walking or climbing stairs? N N  Dressing or bathing? N N  Doing errands, shopping? N N  Preparing Food and eating ? N -  Using the Toilet? N -  In the past six months, have you accidently leaked urine? N -  Do you have problems with loss of bowel control? N -  Managing your Medications? N -  Managing your Finances? N -  Housekeeping or managing your Housekeeping? N -  Some recent data might be hidden    Patient Care Team: Reine JustBurnette, Jennifer M, PA-C as PCP - General (Family Medicine)    Assessment:     Exercise Activities and Dietary recommendations Current Exercise Habits: The patient does not participate in regular exercise at present, Intensity: Mild, Exercise limited by: None identified;Other - see comments (heat)  Goals    . Exercise 3x per week (30 min per time)          Recommend to start walking 3 days a week for 30 minutes.       Fall Risk Fall Risk  10/05/2016 04/27/2016 03/25/2015  Falls in the past year? No No No   Depression Screen PHQ 2/9 Scores 10/05/2016 10/05/2016 04/27/2016 03/25/2015  PHQ - 2 Score 0 0 0 1  PHQ-  9 Score 4 - - -     Cognitive Function     6CIT Screen 10/05/2016  What Year? 0 points  What month? 0 points  What time? 0 points  Count back from 20 0 points  Months in reverse 0 points  Repeat phrase 0 points  Total Score 0    Immunization History  Administered Date(s) Administered  . Pneumococcal Conjugate-13 08/24/2013  . Pneumococcal Polysaccharide-23 03/25/2015   Screening Tests Health Maintenance  Topic Date Due  . Hepatitis C Screening  10/06/2016 (Originally 1945/05/09)  . INFLUENZA VACCINE  01/25/2017 (Originally 11/17/2016)  . MAMMOGRAM  09/17/2017 (Originally 02/24/1998)  . COLONOSCOPY  04/19/2026 (Originally 02/24/1998)  . TETANUS/TDAP  04/19/2026 (Originally [birth year]/25[birth year]1967)  . DEXA SCAN  Completed  . PNA vac Low Risk Adult   Completed      Plan:  I have personally reviewed and addressed the Medicare Annual Wellness questionnaire and have noted the following in the patient's chart:  A. Medical and social history B. Use of alcohol, tobacco or illicit drugs  C. Current medications and supplements D. Functional ability and status E.  Nutritional status F.  Physical activity G. Advance directives H. List of other physicians I.  Hospitalizations, surgeries, and ER visits in previous 12 months J.  Vitals K. Screenings such as hearing and vision if needed, cognitive and depression L. Referrals and appointments - none  In addition, I have reviewed and discussed with patient certain preventive protocols, quality metrics, and best practice recommendations. A written personalized care plan for preventive services as well as general preventive health recommendations were provided to patient.  See attached scanned questionnaire for additional information.   Signed,  Hyacinth Meeker, LPN Nurse Health Advisor   MD Recommendations: None. Pt declined colonoscopy and tetanus vaccine today. Pt to set up mammogram this year. Declined hepatitis C screening today. Pt would like to have this done with other BW at next OV on 10/07/16.

## 2016-10-05 NOTE — Patient Instructions (Signed)
[name] , Thank you for taking time to come for your Medicare Wellness Visit. I appreciate your ongoing commitment to your health goals. Please review the following plan we discussed and let me know if I can assist you in the future.   Screening recommendations/referrals: Colonoscopy: declined referral today Mammogram: pt to set up apt this year Bone Density: completed 09/05/13 Recommended yearly ophthalmology/optometry visit for glaucoma screening and checkup Recommended yearly dental visit for hygiene and checkup  Vaccinations: Influenza vaccine: due 12/2016 Pneumococcal vaccine: completed series Tdap vaccine: declined Shingles vaccine: declined  Advanced directives: Advance directive discussed with you today. Even though you declined this today please call our office should you change your mind and we can give you the proper paperwork for you to fill out.  Conditions/risks identified: Obesity; Recommend to start walking 3 days a week for 30 minutes.   Next appointment: 10/07/16 @ 8:30 AM   Preventive Care 65 Years and Older, Female Preventive care refers to lifestyle choices and visits with your health care provider that can promote health and wellness. What does preventive care include?  A yearly physical exam. This is also called an annual well check.  Dental exams once or twice a year.  Routine eye exams. Ask your health care provider how often you should have your eyes checked.  Personal lifestyle choices, including:  Daily care of your teeth and gums.  Regular physical activity.  Eating a healthy diet.  Avoiding tobacco and drug use.  Limiting alcohol use.  Practicing safe sex.  Taking low-dose aspirin every day.  Taking vitamin and mineral supplements as recommended by your health care provider. What happens during an annual well check? The services and screenings done by your health care provider during your annual well check will depend on your age,  overall health, lifestyle risk factors, and family history of disease. Counseling  Your health care provider may ask you questions about your:  Alcohol use.  Tobacco use.  Drug use.  Emotional well-being.  Home and relationship well-being.  Sexual activity.  Eating habits.  History of falls.  Memory and ability to understand (cognition).  Work and work Astronomerenvironment.  Reproductive health. Screening  You may have the following tests or measurements:  Height, weight, and BMI.  Blood pressure.  Lipid and cholesterol levels. These may be checked every 5 years, or more frequently if you are over 69 years old.  Skin check.  Lung cancer screening. You may have this screening every year starting at age 69 if you have a 30-pack-year history of smoking and currently smoke or have quit within the past 15 years.  Fecal occult blood test (FOBT) of the stool. You may have this test every year starting at age 69.  Flexible sigmoidoscopy or colonoscopy. You may have a sigmoidoscopy every 5 years or a colonoscopy every 10 years starting at age 69.  Hepatitis C blood test.  Hepatitis B blood test.  Sexually transmitted disease (STD) testing.  Diabetes screening. This is done by checking your blood sugar (glucose) after you have not eaten for a while (fasting). You may have this done every 1-3 years.  Bone density scan. This is done to screen for osteoporosis. You may have this done starting at age 69.  Mammogram. This may be done every 1-2 years. Talk to your health care provider about how often you should have regular mammograms. Talk with your health care provider about your test results, treatment options, and if necessary, the need for  more tests. Vaccines  Your health care provider may recommend certain vaccines, such as:  Influenza vaccine. This is recommended every year.  Tetanus, diphtheria, and acellular pertussis (Tdap, Td) vaccine. You may need a Td booster every 10  years.  Zoster vaccine. You may need this after age 34.  Pneumococcal 13-valent conjugate (PCV13) vaccine. One dose is recommended after age 1.  Pneumococcal polysaccharide (PPSV23) vaccine. One dose is recommended after age 11. Talk to your health care provider about which screenings and vaccines you need and how often you need them. This information is not intended to replace advice given to you by your health care provider. Make sure you discuss any questions you have with your health care provider. Document Released: 05/02/2015 Document Revised: 12/24/2015 Document Reviewed: 02/04/2015 Elsevier Interactive Patient Education  2017 [employer] Prevention in the Home Falls can cause injuries. They can happen to people of all ages. There are many things you can do to make your home safe and to help prevent falls. What can I do on the outside of my home?  Regularly fix the edges of walkways and driveways and fix any cracks.  Remove anything that might make you trip as you walk through a door, such as a raised step or threshold.  Trim any bushes or trees on the path to your home.  Use bright outdoor lighting.  Clear any walking paths of anything that might make someone trip, such as rocks or tools.  Regularly check to see if handrails are loose or broken. Make sure that both sides of any steps have handrails.  Any raised decks and porches should have guardrails on the edges.  Have any leaves, snow, or ice cleared regularly.  Use sand or salt on walking paths during winter.  Clean up any spills in your garage right away. This includes oil or grease spills. What can I do in the bathroom?  Use night lights.  Install grab bars by the toilet and in the tub and shower. Do not use towel bars as grab bars.  Use non-skid mats or decals in the tub or shower.  If you need to sit down in the shower, use a plastic, non-slip stool.  Keep the floor dry. Clean up any water that  spills on the floor as soon as it happens.  Remove soap buildup in the tub or shower regularly.  Attach bath mats securely with double-sided non-slip rug tape.  Do not have throw rugs and other things on the floor that can make you trip. What can I do in the bedroom?  Use night lights.  Make sure that you have a light by your bed that is easy to reach.  Do not use any sheets or blankets that are too big for your bed. They should not hang down onto the floor.  Have a firm chair that has side arms. You can use this for support while you get dressed.  Do not have throw rugs and other things on the floor that can make you trip. What can I do in the kitchen?  Clean up any spills right away.  Avoid walking on wet floors.  Keep items that you use a lot in easy-to-reach places.  If you need to reach something above you, use a strong step stool that has a grab bar.  Keep electrical cords out of the way.  Do not use floor polish or wax that makes floors slippery. If you must use wax, use non-skid  floor wax.  Do not have throw rugs and other things on the floor that can make you trip. What can I do with my stairs?  Do not leave any items on the stairs.  Make sure that there are handrails on both sides of the stairs and use them. Fix handrails that are broken or loose. Make sure that handrails are as long as the stairways.  Check any carpeting to make sure that it is firmly attached to the stairs. Fix any carpet that is loose or worn.  Avoid having throw rugs at the top or bottom of the stairs. If you do have throw rugs, attach them to the floor with carpet tape.  Make sure that you have a light switch at the top of the stairs and the bottom of the stairs. If you do not have them, ask someone to add them for you. What else can I do to help prevent falls?  Wear shoes that:  Do not have high heels.  Have rubber bottoms.  Are comfortable and fit you well.  Are closed at the  toe. Do not wear sandals.  If you use a stepladder:  Make sure that it is fully opened. Do not climb a closed stepladder.  Make sure that both sides of the stepladder are locked into place.  Ask someone to hold it for you, if possible.  Clearly mark and make sure that you can see:  Any grab bars or handrails.  First and last steps.  Where the edge of each step is.  Use tools that help you move around (mobility aids) if they are needed. These include:  Canes.  Walkers.  Scooters.  Crutches.  Turn on the lights when you go into a dark area. Replace any light bulbs as soon as they burn out.  Set up your furniture so you have a clear path. Avoid moving your furniture around.  If any of your floors are uneven, fix them.  If there are any pets around you, be aware of where they are.  Review your medicines with your doctor. Some medicines can make you feel dizzy. This can increase your chance of falling. Ask your doctor what other things that you can do to help prevent falls. This information is not intended to replace advice given to you by your health care provider. Make sure you discuss any questions you have with your health care provider. Document Released: 01/30/2009 Document Revised: 09/11/2015 Document Reviewed: 05/10/2014 Elsevier Interactive Patient Education  2017 [employer].

## 2016-10-07 ENCOUNTER — Encounter: Payer: Self-pay | Admitting: Physician Assistant

## 2016-10-07 ENCOUNTER — Ambulatory Visit (INDEPENDENT_AMBULATORY_CARE_PROVIDER_SITE_OTHER): Payer: Medicare HMO | Admitting: Physician Assistant

## 2016-10-07 VITALS — BP 150/88 | HR 65 | Temp 98.5°F | Resp 16 | Wt 192.2 lb

## 2016-10-07 DIAGNOSIS — Z0000 Encounter for general adult medical examination without abnormal findings: Secondary | ICD-10-CM

## 2016-10-07 DIAGNOSIS — R739 Hyperglycemia, unspecified: Secondary | ICD-10-CM

## 2016-10-07 DIAGNOSIS — Z1239 Encounter for other screening for malignant neoplasm of breast: Secondary | ICD-10-CM

## 2016-10-07 DIAGNOSIS — M8580 Other specified disorders of bone density and structure, unspecified site: Secondary | ICD-10-CM

## 2016-10-07 DIAGNOSIS — Z1231 Encounter for screening mammogram for malignant neoplasm of breast: Secondary | ICD-10-CM

## 2016-10-07 DIAGNOSIS — I10 Essential (primary) hypertension: Secondary | ICD-10-CM

## 2016-10-07 DIAGNOSIS — E782 Mixed hyperlipidemia: Secondary | ICD-10-CM | POA: Diagnosis not present

## 2016-10-07 DIAGNOSIS — Z1159 Encounter for screening for other viral diseases: Secondary | ICD-10-CM | POA: Diagnosis not present

## 2016-10-07 DIAGNOSIS — Z1211 Encounter for screening for malignant neoplasm of colon: Secondary | ICD-10-CM

## 2016-10-07 DIAGNOSIS — Z780 Asymptomatic menopausal state: Secondary | ICD-10-CM | POA: Diagnosis not present

## 2016-10-07 MED ORDER — BISOPROLOL-HYDROCHLOROTHIAZIDE 10-6.25 MG PO TABS: 1.0000 | ORAL_TABLET | Freq: Every day | ORAL | 3 refills | Status: DC

## 2016-10-07 NOTE — Patient Instructions (Signed)
Health Maintenance for Postmenopausal Women Menopause is a normal process in which your reproductive ability comes to an end. This process happens gradually over a span of months to years, usually between the ages of 22 and 9. Menopause is complete when you have missed 12 consecutive menstrual periods. It is important to talk with your health care provider about some of the most common conditions that affect postmenopausal women, such as heart disease, cancer, and bone loss (osteoporosis). Adopting a healthy lifestyle and getting preventive care can help to promote your health and wellness. Those actions can also lower your chances of developing some of these common conditions. What should I know about menopause? During menopause, you may experience a number of symptoms, such as:  Moderate-to-severe hot flashes.  Night sweats.  Decrease in sex drive.  Mood swings.  Headaches.  Tiredness.  Irritability.  Memory problems.  Insomnia.  Choosing to treat or not to treat menopausal changes is an individual decision that you make with your health care provider. What should I know about hormone replacement therapy and supplements? Hormone therapy products are effective for treating symptoms that are associated with menopause, such as hot flashes and night sweats. Hormone replacement carries certain risks, especially as you become older. If you are thinking about using estrogen or estrogen with progestin treatments, discuss the benefits and risks with your health care provider. What should I know about heart disease and stroke? Heart disease, heart attack, and stroke become more likely as you age. This may be due, in part, to the hormonal changes that your body experiences during menopause. These can affect how your body processes dietary fats, triglycerides, and cholesterol. Heart attack and stroke are both medical emergencies. There are many things that you can do to help prevent heart disease  and stroke:  Have your blood pressure checked at least every 1-2 years. High blood pressure causes heart disease and increases the risk of stroke.  If you are 53-22 years old, ask your health care provider if you should take aspirin to prevent a heart attack or a stroke.  Do not use any tobacco products, including cigarettes, chewing tobacco, or electronic cigarettes. If you need help quitting, ask your health care provider.  It is important to eat a healthy diet and maintain a healthy weight. ? Be sure to include plenty of vegetables, fruits, low-fat dairy products, and lean protein. ? Avoid eating foods that are high in solid fats, added sugars, or salt (sodium).  Get regular exercise. This is one of the most important things that you can do for your health. ? Try to exercise for at least 150 minutes each week. The type of exercise that you do should increase your heart rate and make you sweat. This is known as moderate-intensity exercise. ? Try to do strengthening exercises at least twice each week. Do these in addition to the moderate-intensity exercise.  Know your numbers.Ask your health care provider to check your cholesterol and your blood glucose. Continue to have your blood tested as directed by your health care provider.  What should I know about cancer screening? There are several types of cancer. Take the following steps to reduce your risk and to catch any cancer development as early as possible. Breast Cancer  Practice breast self-awareness. ? This means understanding how your breasts normally appear and feel. ? It also means doing regular breast self-exams. Let your health care provider know about any changes, no matter how small.  If you are 40  or older, have a clinician do a breast exam (clinical breast exam or CBE) every year. Depending on your age, family history, and medical history, it may be recommended that you also have a yearly breast X-ray (mammogram).  If you  have a family history of breast cancer, talk with your health care provider about genetic screening.  If you are at high risk for breast cancer, talk with your health care provider about having an MRI and a mammogram every year.  Breast cancer (BRCA) gene test is recommended for women who have family members with BRCA-related cancers. Results of the assessment will determine the need for genetic counseling and BRCA1 and for BRCA2 testing. BRCA-related cancers include these types: ? Breast. This occurs in males or females. ? Ovarian. ? Tubal. This may also be called fallopian tube cancer. ? Cancer of the abdominal or pelvic lining (peritoneal cancer). ? Prostate. ? Pancreatic.  Cervical, Uterine, and Ovarian Cancer Your health care provider may recommend that you be screened regularly for cancer of the pelvic organs. These include your ovaries, uterus, and vagina. This screening involves a pelvic exam, which includes checking for microscopic changes to the surface of your cervix (Pap test).  For women ages 21-65, health care providers may recommend a pelvic exam and a Pap test every three years. For women ages 79-65, they may recommend the Pap test and pelvic exam, combined with testing for human papilloma virus (HPV), every five years. Some types of HPV increase your risk of cervical cancer. Testing for HPV may also be done on women of any age who have unclear Pap test results.  Other health care providers may not recommend any screening for nonpregnant women who are considered low risk for pelvic cancer and have no symptoms. Ask your health care provider if a screening pelvic exam is right for you.  If you have had past treatment for cervical cancer or a condition that could lead to cancer, you need Pap tests and screening for cancer for at least 20 years after your treatment. If Pap tests have been discontinued for you, your risk factors (such as having a new sexual partner) need to be  reassessed to determine if you should start having screenings again. Some women have medical problems that increase the chance of getting cervical cancer. In these cases, your health care provider may recommend that you have screening and Pap tests more often.  If you have a family history of uterine cancer or ovarian cancer, talk with your health care provider about genetic screening.  If you have vaginal bleeding after reaching menopause, tell your health care provider.  There are currently no reliable tests available to screen for ovarian cancer.  Lung Cancer Lung cancer screening is recommended for adults 69-62 years old who are at high risk for lung cancer because of a history of smoking. A yearly low-dose CT scan of the lungs is recommended if you:  Currently smoke.  Have a history of at least 30 pack-years of smoking and you currently smoke or have quit within the past 15 years. A pack-year is smoking an average of one pack of cigarettes per day for one year.  Yearly screening should:  Continue until it has been 15 years since you quit.  Stop if you develop a health problem that would prevent you from having lung cancer treatment.  Colorectal Cancer  This type of cancer can be detected and can often be prevented.  Routine colorectal cancer screening usually begins at  age 42 and continues through age 45.  If you have risk factors for colon cancer, your health care provider may recommend that you be screened at an earlier age.  If you have a family history of colorectal cancer, talk with your health care provider about genetic screening.  Your health care provider may also recommend using home test kits to check for hidden blood in your stool.  A small camera at the end of a tube can be used to examine your colon directly (sigmoidoscopy or colonoscopy). This is done to check for the earliest forms of colorectal cancer.  Direct examination of the colon should be repeated every  5-10 years until age 71. However, if early forms of precancerous polyps or small growths are found or if you have a family history or genetic risk for colorectal cancer, you may need to be screened more often.  Skin Cancer  Check your skin from head to toe regularly.  Monitor any moles. Be sure to tell your health care provider: ? About any new moles or changes in moles, especially if there is a change in a mole's shape or color. ? If you have a mole that is larger than the size of a pencil eraser.  If any of your family members has a history of skin cancer, especially at a young age, talk with your health care provider about genetic screening.  Always use sunscreen. Apply sunscreen liberally and repeatedly throughout the day.  Whenever you are outside, protect yourself by wearing long sleeves, pants, a wide-brimmed hat, and sunglasses.  What should I know about osteoporosis? Osteoporosis is a condition in which bone destruction happens more quickly than new bone creation. After menopause, you may be at an increased risk for osteoporosis. To help prevent osteoporosis or the bone fractures that can happen because of osteoporosis, the following is recommended:  If you are 46-71 years old, get at least 1,000 mg of calcium and at least 600 mg of vitamin D per day.  If you are older than age 55 but younger than age 65, get at least 1,200 mg of calcium and at least 600 mg of vitamin D per day.  If you are older than age 54, get at least 1,200 mg of calcium and at least 800 mg of vitamin D per day.  Smoking and excessive alcohol intake increase the risk of osteoporosis. Eat foods that are rich in calcium and vitamin D, and do weight-bearing exercises several times each week as directed by your health care provider. What should I know about how menopause affects my mental health? Depression may occur at any age, but it is more common as you become older. Common symptoms of depression  include:  Low or sad mood.  Changes in sleep patterns.  Changes in appetite or eating patterns.  Feeling an overall lack of motivation or enjoyment of activities that you previously enjoyed.  Frequent crying spells.  Talk with your health care provider if you think that you are experiencing depression. What should I know about immunizations? It is important that you get and maintain your immunizations. These include:  Tetanus, diphtheria, and pertussis (Tdap) booster vaccine.  Influenza every year before the flu season begins.  Pneumonia vaccine.  Shingles vaccine.  Your health care provider may also recommend other immunizations. This information is not intended to replace advice given to you by your health care provider. Make sure you discuss any questions you have with your health care provider. Document Released: 05/28/2005  Document Revised: 10/24/2015 Document Reviewed: 01/07/2015 Elsevier Interactive Patient Education  2018 [employer] DASH Eating Plan DASH stands for "Dietary Approaches to Stop Hypertension." The DASH eating plan is a healthy eating plan that has been shown to reduce high blood pressure (hypertension). It may also reduce your risk for type 2 diabetes, heart disease, and stroke. The DASH eating plan may also help with weight loss. What are tips for following this plan? General guidelines  Avoid eating more than 2,300 mg (milligrams) of salt (sodium) a day. If you have hypertension, you may need to reduce your sodium intake to 1,500 mg a day.  Limit alcohol intake to no more than 1 drink a day for nonpregnant women and 2 drinks a day for men. One drink equals 12 oz of beer, 5 oz of wine, or 1 oz of hard liquor.  Work with your health care provider to maintain a healthy body weight or to lose weight. Ask what an ideal weight is for you.  Get at least 30 minutes of exercise that causes your heart to beat faster (aerobic exercise) most days of the week.  Activities may include walking, swimming, or biking.  Work with your health care provider or diet and nutrition specialist (dietitian) to adjust your eating plan to your individual calorie needs. Reading food labels  Check food labels for the amount of sodium per serving. Choose foods with less than 5 percent of the Daily Value of sodium. Generally, foods with less than 300 mg of sodium per serving fit into this eating plan.  To find whole grains, look for the word "whole" as the first word in the ingredient list. Shopping  Buy products labeled as "low-sodium" or "no salt added."  Buy fresh foods. Avoid canned foods and premade or frozen meals. Cooking  Avoid adding salt when cooking. Use salt-free seasonings or herbs instead of table salt or sea salt. Check with your health care provider or pharmacist before using salt substitutes.  Do not fry foods. Cook foods using healthy methods such as baking, boiling, grilling, and broiling instead.  Cook with heart-healthy oils, such as olive, canola, soybean, or sunflower oil. Meal planning   Eat a balanced diet that includes: ? 5 or more servings of fruits and vegetables each day. At each meal, try to fill half of your plate with fruits and vegetables. ? Up to 6-8 servings of whole grains each day. ? Less than 6 oz of lean meat, poultry, or fish each day. A 3-oz serving of meat is about the same size as a deck of cards. One egg equals 1 oz. ? 2 servings of low-fat dairy each day. ? A serving of nuts, seeds, or beans 5 times each week. ? Heart-healthy fats. Healthy fats called Omega-3 fatty acids are found in foods such as flaxseeds and coldwater fish, like sardines, salmon, and mackerel.  Limit how much you eat of the following: ? Canned or prepackaged foods. ? Food that is high in trans fat, such as fried foods. ? Food that is high in saturated fat, such as fatty meat. ? Sweets, desserts, sugary drinks, and other foods with added  sugar. ? Full-fat dairy products.  Do not salt foods before eating.  Try to eat at least 2 vegetarian meals each week.  Eat more home-cooked food and less restaurant, buffet, and fast food.  When eating at a restaurant, ask that your food be prepared with less salt or no salt, if possible. What foods are recommended?   The items listed may not be a complete list. Talk with your dietitian about what dietary choices are best for you. Grains Whole-grain or whole-wheat bread. Whole-grain or whole-wheat pasta. Brown rice. Oatmeal. Quinoa. Bulgur. Whole-grain and low-sodium cereals. Pita bread. Low-fat, low-sodium crackers. Whole-wheat flour tortillas. Vegetables Fresh or frozen vegetables (raw, steamed, roasted, or grilled). Low-sodium or reduced-sodium tomato and vegetable juice. Low-sodium or reduced-sodium tomato sauce and tomato paste. Low-sodium or reduced-sodium canned vegetables. Fruits All fresh, dried, or frozen fruit. Canned fruit in natural juice (without added sugar). Meat and other protein foods Skinless chicken or turkey. Ground chicken or turkey. Pork with fat trimmed off. Fish and seafood. Egg whites. Dried beans, peas, or lentils. Unsalted nuts, nut butters, and seeds. Unsalted canned beans. Lean cuts of beef with fat trimmed off. Low-sodium, lean deli meat. Dairy Low-fat (1%) or fat-free (skim) milk. Fat-free, low-fat, or reduced-fat cheeses. Nonfat, low-sodium ricotta or cottage cheese. Low-fat or nonfat yogurt. Low-fat, low-sodium cheese. Fats and oils Soft margarine without trans fats. Vegetable oil. Low-fat, reduced-fat, or light mayonnaise and salad dressings (reduced-sodium). Canola, safflower, olive, soybean, and sunflower oils. Avocado. Seasoning and other foods Herbs. Spices. Seasoning mixes without salt. Unsalted popcorn and pretzels. Fat-free sweets. What foods are not recommended? The items listed may not be a complete list. Talk with your dietitian about what  dietary choices are best for you. Grains Baked goods made with fat, such as croissants, muffins, or some breads. Dry pasta or rice meal packs. Vegetables Creamed or fried vegetables. Vegetables in a cheese sauce. Regular canned vegetables (not low-sodium or reduced-sodium). Regular canned tomato sauce and paste (not low-sodium or reduced-sodium). Regular tomato and vegetable juice (not low-sodium or reduced-sodium). Pickles. Olives. Fruits Canned fruit in a light or heavy syrup. Fried fruit. Fruit in cream or butter sauce. Meat and other protein foods Fatty cuts of meat. Ribs. Fried meat. Bacon. Sausage. Bologna and other processed lunch meats. Salami. Fatback. Hotdogs. Bratwurst. Salted nuts and seeds. Canned beans with added salt. Canned or smoked fish. Whole eggs or egg yolks. Chicken or turkey with skin. Dairy Whole or 2% milk, cream, and half-and-half. Whole or full-fat cream cheese. Whole-fat or sweetened yogurt. Full-fat cheese. Nondairy creamers. Whipped toppings. Processed cheese and cheese spreads. Fats and oils Butter. Stick margarine. Lard. Shortening. Ghee. Bacon fat. Tropical oils, such as coconut, palm kernel, or palm oil. Seasoning and other foods Salted popcorn and pretzels. Onion salt, garlic salt, seasoned salt, table salt, and sea salt. Worcestershire sauce. Tartar sauce. Barbecue sauce. Teriyaki sauce. Soy sauce, including reduced-sodium. Steak sauce. Canned and packaged gravies. Fish sauce. Oyster sauce. Cocktail sauce. Horseradish that you find on the shelf. Ketchup. Mustard. Meat flavorings and tenderizers. Bouillon cubes. Hot sauce and Tabasco sauce. Premade or packaged marinades. Premade or packaged taco seasonings. Relishes. Regular salad dressings. Where to find more information:  National Heart, Lung, and Blood Institute: www.nhlbi.nih.gov  American Heart Association: www.heart.org Summary  The DASH eating plan is a healthy eating plan that has been shown to reduce  high blood pressure (hypertension). It may also reduce your risk for type 2 diabetes, heart disease, and stroke.  With the DASH eating plan, you should limit salt (sodium) intake to 2,300 mg a day. If you have hypertension, you may need to reduce your sodium intake to 1,500 mg a day.  When on the DASH eating plan, aim to eat more fresh fruits and vegetables, whole grains, lean proteins, low-fat dairy, and heart-healthy fats.  Work with your health care   provider or diet and nutrition specialist (dietitian) to adjust your eating plan to your individual calorie needs. This information is not intended to replace advice given to you by your health care provider. Make sure you discuss any questions you have with your health care provider. Document Released: 03/25/2011 Document Revised: 03/29/2016 Document Reviewed: 03/29/2016 Elsevier Interactive Patient Education  2017 [employer].

## 2016-10-07 NOTE — Progress Notes (Signed)
[name], Female    DOB: 08/24/1945, 71 y.o.   MRN: [id number] Visit Date: 10/07/2016  Today's Provider: Margaretann LovelessJennifer M Burnette, PA-C   Chief Complaint  Patient presents with  . Annual Exam    and follow-up Hypertension and elevated sugar   Subjective:    Annual physical exam Marissa Plumeeggy R Ironside is a 71 y.o. female who presents today for health maintenance and complete physical. She feels well. She reports exercising none. She reports she is sleeping fairly well.  -----------------------------------------------------------------  Hypertension, follow-up:  BP Readings from Last 3 Encounters:  10/07/16 (!) 150/88  10/05/16 (!) 150/82  04/27/16 (!) 142/80    She was last seen for hypertension 6 months ago.  BP at that visit was 142/80. Management since that visit includes none. BP stable.continue Amlodipine 5mg  and Ziac 10-6.25 mg. She reports excellent compliance with treatment. She is not having side effects.  She is not exercising. She is adherent to low salt diet.   Outside blood pressures are . She is experiencing none.  Patient denies chest pain, chest pressure/discomfort, exertional chest pressure/discomfort, fatigue, irregular heart beat, lower extremity edema, near-syncope and palpitations.   Cardiovascular risk factors include advanced age (older than 7755 for men, 7465 for women), hypertension and obesity (BMI >= 30 kg/m2).    Patient reports that she was out of the Amlodipine and re-started back a week ago.  Weight trend: stable Wt Readings from Last 3 Encounters:  10/07/16 192 lb 3.2 oz (87.2 kg)  10/05/16 190 lb 6.4 oz (86.4 kg)  04/27/16 185 lb (83.9 kg)    Current diet: well balanced ------------------------------------------------------------------------ Elevated Blood Sugar: A1c was 5.9 five months ago. Will recheck today with other labs  Review of Systems  Constitutional: Negative for fatigue.  Eyes: Negative for visual  disturbance.  Cardiovascular: Negative for chest pain, palpitations and leg swelling.  Neurological: Negative for dizziness, light-headedness and headaches.    Social History      She  reports that she has never smoked. She has never used smokeless tobacco. She reports that she drinks alcohol. She reports that she does not use drugs.       Social History   Social History  . Marital status: Divorced    Spouse name: N/A  . Number of children: 2  . Years of education: H/S   Occupational History  . Retired    Social History Main Topics  . Smoking status: Never Smoker  . Smokeless tobacco: Never Used  . Alcohol use Yes     Comment: Occasional Wine  . Drug use: No  . Sexual activity: Not Asked   Other Topics Concern  . None   Social History Narrative  . None    History reviewed. No pertinent past medical history.   Patient Active Problem List   Diagnosis Date Noted  . Benign paroxysmal positional nystagmus 12/31/2014  . Carpal tunnel syndrome 12/31/2014  . Blood glucose elevated 12/31/2014  . Avitaminosis D 10/09/2009  . Allergic rhinitis 07/29/2009  . 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 BP (high blood pressure) 10/09/2001  . Cannot sleep 10/09/2001    Past Surgical History:  Procedure Laterality Date  . ABDOMINAL HYSTERECTOMY  1990   Abdominal  . CARPAL TUNNEL RELEASE Left 01/2010    Family History        Family Status  Relation Status  . Mother Alive  . Father Deceased at age Mid 5560's  Lung Cancer  . Other Alive        Her family history includes Healthy in her other; Hypertension in her mother; Lung cancer in her father.     Allergies  Allergen Reactions  . Acetaminophen-Codeine      Current Outpatient Prescriptions:  .  amLODipine (NORVASC) 5 MG tablet, Take 1 tablet (5 mg total) by mouth daily., Disp: 90 tablet, Rfl: 3 .  bisoprolol-hydrochlorothiazide (ZIAC) 10-6.25 MG tablet, Take 1 tablet by mouth daily.,  Disp: 90 tablet, Rfl: 3 .  fluticasone (FLONASE) 50 MCG/ACT nasal spray, Place 2 sprays into both nostrils daily., Disp: 48 g, Rfl: 3 .  loratadine (CLARITIN) 10 MG tablet, Take 10 mg by mouth daily as needed. , Disp: , Rfl:  .  meloxicam (MOBIC) 7.5 MG tablet, Take 1 tablet (7.5 mg total) by mouth daily., Disp: 90 tablet, Rfl: 3 .  simvastatin (ZOCOR) 10 MG tablet, Take 1 tablet (10 mg total) by mouth daily at 6 PM., Disp: 90 tablet, Rfl: 3 .  zolpidem (AMBIEN) 5 MG tablet, TAKE 1 TABLET AT BEDTIME AS NEEDED FOR SLEEP, Disp: 90 tablet, Rfl: 1 .  Vitamin D, Cholecalciferol, 1000 units CAPS, Take 1 capsule by mouth daily., Disp: , Rfl:    Patient Care Team: Margaretann Loveless, PA-C as PCP - General (Family Medicine)      Objective:   Vitals: BP (!) 150/88 (BP Location: Right Arm, Patient Position: Sitting, Cuff Size: Normal)   Pulse 65   Temp 98.5 F (36.9 C) (Oral)   Resp 16   Wt 192 lb 3.2 oz (87.2 kg)   BMI 35.15 kg/m    Vitals:   10/07/16 0839  BP: (!) 150/88  Pulse: 65  Resp: 16  Temp: 98.5 F (36.9 C)  TempSrc: Oral  Weight: 192 lb 3.2 oz (87.2 kg)     Physical Exam  Constitutional: She is oriented to person, place, and time. She appears well-developed and well-nourished. No distress.  HENT:  Head: Normocephalic and atraumatic.  Right Ear: Hearing, tympanic membrane, external ear and ear canal normal.  Left Ear: Hearing, tympanic membrane, external ear and ear canal normal.  Nose: Nose normal.  Mouth/Throat: Uvula is midline, oropharynx is clear and moist and mucous membranes are normal. No oropharyngeal exudate.  Eyes: Conjunctivae and EOM are normal. Pupils are equal, round, and reactive to light. Right eye exhibits no discharge. Left eye exhibits no discharge. No scleral icterus.  Neck: Normal range of motion. Neck supple. No JVD present. Carotid bruit is not present. No tracheal deviation present. No thyromegaly present.  Cardiovascular: Normal rate, regular  rhythm, normal heart sounds and intact distal pulses.  Exam reveals no gallop and no friction rub.   No murmur heard. Pulmonary/Chest: Effort normal and breath sounds normal. No respiratory distress. She has no decreased breath sounds. She has no wheezes. She has no rales. She exhibits no tenderness.  Abdominal: Soft. Bowel sounds are normal. She exhibits no distension and no mass. There is no tenderness. There is no rebound and no guarding.  Musculoskeletal: Normal range of motion. She exhibits no edema or tenderness.  Lymphadenopathy:    She has no cervical adenopathy.  Neurological: She is alert and oriented to person, place, and time.  Skin: Skin is warm and dry. No rash noted. She is not diaphoretic.  Psychiatric: She has a normal mood and affect. Her behavior is normal. Judgment and thought content normal.  Vitals reviewed.    Depression Screen [facility] 2/9  Scores 10/05/2016 10/05/2016 04/27/2016 03/25/2015  PHQ - 2 Score 0 0 0 1  PHQ- 9 Score 4 - - -      Assessment & Plan:     Routine Health Maintenance and Physical Exam  Exercise Activities and Dietary recommendations Goals    . Exercise 3x per week (30 min per time)          Recommend to start walking 3 days a week for 30 minutes.        Immunization History  Administered Date(s) Administered  . Pneumococcal Conjugate-13 08/24/2013  . Pneumococcal Polysaccharide-23 03/25/2015    Health Maintenance  Topic Date Due  . Hepatitis C Screening  03-Jan-1946  . INFLUENZA VACCINE  01/25/2017 (Originally 11/17/2016)  . MAMMOGRAM  09/17/2017 (Originally 02/24/1998)  . COLONOSCOPY  04/19/2026 (Originally 02/24/1998)  . TETANUS/TDAP  04/19/2026 (Originally 02/25/1967)  . DEXA SCAN  Completed  . PNA vac Low Risk Adult  Completed     Discussed health benefits of physical activity, and encouraged her to engage in regular exercise appropriate for her age and condition.    1. Annual physical exam Normal physical exam today. Will  check labs as below and f/u pending lab results. If labs are stable and WNL she will not need to have these rechecked for one year at her next annual physical exam. She is to call the office in the meantime if she has any acute issue, questions or concerns. - CBC with Differential/Platelet - Comprehensive metabolic panel - TSH  2. Breast cancer screening Breast exam today was normal. There is no family history of breast cancer. She does perform regular self breast exams. Mammogram was ordered as below. Information for [facility] clinic was given to patient so she may schedule her mammogram at her convenience. - MM Digital Screening; Future  3. Colon cancer screening Patient refuses colonoscopy. She reports no polyps and no family history of colon cancer. She does agree to cologuard and will get diagnostic colonoscopy if cologuard positive.  - Cologuard  4. Essential hypertension Elevated today but patient has been without BP medications. She has restarted Amlodipine and Ziac. Will check labs as below and f/u pending results. I will see her back in 6 months to recheck.  - CBC with Differential/Platelet - Comprehensive metabolic panel - Hemoglobin A1c - Lipid panel - bisoprolol-hydrochlorothiazide (ZIAC) 10-6.25 MG tablet; Take 1 tablet by mouth daily.  Dispense: 90 tablet; Refill: 3  5. Blood glucose elevated Will check labs as below and f/u pending results. I will see her back in 6 months to recheck.  - CBC with Differential/Platelet - Comprehensive metabolic panel - Hemoglobin A1c - Lipid panel  6. Combined fat and carbohydrate induced hyperlipemia Stable on Simvastatin 10mg . Will check labs as below and f/u pending results. - CBC with Differential/Platelet - Comprehensive metabolic panel - Hemoglobin A1c - Lipid panel  7. Osteopenia, unspecified location Previous BMD was in 2015 and showed T score -2.4. Patient takes a Vit D supplement but denies calcium. Will recheck BMD  as below. - DG Bone Density; Future  8. Postmenopausal estrogen deficiency See above medical treatment plan. - DG Bone Density; Future  9. Need for hepatitis C screening test - Hepatitis C antibody  --------------------------------------------------------------------    Margaretann Loveless, PA-C  [facility]

## 2016-10-08 ENCOUNTER — Telehealth: Payer: Self-pay | Admitting: Physician Assistant

## 2016-10-08 LAB — COMPREHENSIVE METABOLIC PANEL
ALBUMIN: 4.3 g/dL (ref 3.6–4.8)
ALT: 13 IU/L (ref 0–32)
AST: 16 IU/L (ref 0–40)
Albumin/Globulin Ratio: 1.4 (ref 1.2–2.2)
Alkaline Phosphatase: 102 IU/L (ref 39–117)
BUN / CREAT RATIO: 9 — AB (ref 12–28)
BUN: 8 mg/dL (ref 8–27)
Bilirubin Total: 0.4 mg/dL (ref 0.0–1.2)
CALCIUM: 9.7 mg/dL (ref 8.7–10.3)
CHLORIDE: 100 mmol/L (ref 96–106)
CO2: 23 mmol/L (ref 20–29)
CREATININE: 0.91 mg/dL (ref 0.57–1.00)
GFR calc non Af Amer: 65 mL/min/{1.73_m2} (ref >59)
GFR, EST AFRICAN AMERICAN: 75 mL/min/{1.73_m2} (ref >59)
Globulin, Total: 3.1 g/dL (ref 1.5–4.5)
Glucose: 114 mg/dL — ABNORMAL HIGH (ref 65–99)
Potassium: 4 mmol/L (ref 3.5–5.2)
Sodium: 139 mmol/L (ref 134–144)
TOTAL PROTEIN: 7.4 g/dL (ref 6.0–8.5)

## 2016-10-08 LAB — LIPID PANEL
CHOLESTEROL TOTAL: 155 mg/dL (ref 100–199)
Chol/HDL Ratio: 3.4 ratio (ref 0.0–4.4)
HDL: 45 mg/dL (ref >39)
LDL CALC: 90 mg/dL (ref 0–99)
TRIGLYCERIDES: 99 mg/dL (ref 0–149)
VLDL CHOLESTEROL CAL: 20 mg/dL (ref 5–40)

## 2016-10-08 LAB — CBC WITH DIFFERENTIAL/PLATELET
Basophils Absolute: 0 10*3/uL (ref 0.0–0.2)
Basos: 0 %
EOS (ABSOLUTE): 0.2 10*3/uL (ref 0.0–0.4)
Eos: 2 %
HEMOGLOBIN: 13.3 g/dL (ref 11.1–15.9)
Hematocrit: 40.6 % (ref 34.0–46.6)
IMMATURE GRANS (ABS): 0 10*3/uL (ref 0.0–0.1)
IMMATURE GRANULOCYTES: 0 %
LYMPHS: 39 %
Lymphocytes Absolute: 4.1 10*3/uL — ABNORMAL HIGH (ref 0.7–3.1)
MCH: 27.5 pg (ref 26.6–33.0)
MCHC: 32.8 g/dL (ref 31.5–35.7)
MCV: 84 fL (ref 79–97)
MONOCYTES: 5 %
Monocytes Absolute: 0.5 10*3/uL (ref 0.1–0.9)
NEUTROS ABS: 5.6 10*3/uL (ref 1.4–7.0)
NEUTROS PCT: 54 %
PLATELETS: 419 10*3/uL — AB (ref 150–379)
RBC: 4.84 x10E6/uL (ref 3.77–5.28)
RDW: 13.7 % (ref 12.3–15.4)
WBC: 10.5 10*3/uL (ref 3.4–10.8)

## 2016-10-08 LAB — HEMOGLOBIN A1C
ESTIMATED AVERAGE GLUCOSE: 120 mg/dL
Hgb A1c MFr Bld: 5.8 % — ABNORMAL HIGH (ref 4.8–5.6)

## 2016-10-08 LAB — TSH: TSH: 2.32 u[IU]/mL (ref 0.450–4.500)

## 2016-10-08 LAB — HEPATITIS C ANTIBODY: Hep C Virus Ab: <0.1 s/co ratio (ref 0.0–0.9)

## 2016-10-08 NOTE — Telephone Encounter (Signed)
-----   Message from Adline PealsElena D Desanto, CMA sent at 10/08/2016 12:09 PM EDT ----- No answer

## 2016-10-08 NOTE — Telephone Encounter (Signed)
Patient advised as directed below.  Thanks,  -Garett Tetzloff 

## 2016-10-08 NOTE — Telephone Encounter (Signed)
Order for cologuard faxed to [facility] °

## 2016-12-08 ENCOUNTER — Ambulatory Visit
Admission: RE | Admit: 2016-12-08 | Discharge: 2016-12-08 | Disposition: A | Discharge status: Home / Self Care | Payer: Medicare HMO | Source: Ambulatory Visit | Attending: Physician Assistant | Admitting: Physician Assistant

## 2016-12-08 ENCOUNTER — Encounter: Payer: Self-pay | Admitting: Physician Assistant

## 2016-12-08 DIAGNOSIS — Z1239 Encounter for other screening for malignant neoplasm of breast: Secondary | ICD-10-CM

## 2016-12-08 DIAGNOSIS — M8580 Other specified disorders of bone density and structure, unspecified site: Secondary | ICD-10-CM | POA: Insufficient documentation

## 2016-12-08 DIAGNOSIS — Z780 Asymptomatic menopausal state: Secondary | ICD-10-CM | POA: Insufficient documentation

## 2016-12-08 DIAGNOSIS — Z1231 Encounter for screening mammogram for malignant neoplasm of breast: Secondary | ICD-10-CM | POA: Diagnosis not present

## 2016-12-08 DIAGNOSIS — M810 Age-related osteoporosis without current pathological fracture: Secondary | ICD-10-CM | POA: Insufficient documentation

## 2016-12-10 ENCOUNTER — Inpatient Hospital Stay
Admission: RE | Admit: 2016-12-10 | Discharge: 2016-12-10 | Disposition: A | Discharge status: Home / Self Care | Payer: Self-pay | Source: Ambulatory Visit | Attending: *Deleted | Admitting: *Deleted

## 2016-12-10 ENCOUNTER — Other Ambulatory Visit: Payer: Self-pay | Admitting: *Deleted

## 2016-12-10 DIAGNOSIS — Z9289 Personal history of other medical treatment: Secondary | ICD-10-CM

## 2016-12-16 ENCOUNTER — Ambulatory Visit: Payer: Medicare HMO | Admitting: Physician Assistant

## 2017-02-14 ENCOUNTER — Other Ambulatory Visit: Payer: Self-pay | Admitting: Physician Assistant

## 2017-02-14 DIAGNOSIS — G4700 Insomnia, unspecified: Secondary | ICD-10-CM

## 2017-02-14 NOTE — Telephone Encounter (Signed)
[facility] patient received a 90 day Rx with 1 refill on 11/17/16 that was filled on 11/18/16. She should not need refill until January 2019.

## 2017-02-21 ENCOUNTER — Other Ambulatory Visit: Payer: Self-pay | Admitting: Physician Assistant

## 2017-02-21 DIAGNOSIS — G4700 Insomnia, unspecified: Secondary | ICD-10-CM

## 2017-02-22 NOTE — Telephone Encounter (Signed)
Med called in and pt informed 

## 2017-03-28 ENCOUNTER — Ambulatory Visit: Payer: Medicare HMO | Admitting: Physician Assistant

## 2017-04-06 ENCOUNTER — Encounter: Payer: Self-pay | Admitting: Physician Assistant

## 2017-04-06 ENCOUNTER — Ambulatory Visit: Payer: Medicare HMO | Admitting: Physician Assistant

## 2017-04-06 VITALS — BP 142/88 | HR 60 | Temp 98.2°F | Resp 16 | Wt 188.4 lb

## 2017-04-06 DIAGNOSIS — I10 Essential (primary) hypertension: Secondary | ICD-10-CM | POA: Diagnosis not present

## 2017-04-06 DIAGNOSIS — M810 Age-related osteoporosis without current pathological fracture: Secondary | ICD-10-CM

## 2017-04-06 DIAGNOSIS — F5101 Primary insomnia: Secondary | ICD-10-CM

## 2017-04-06 DIAGNOSIS — E782 Mixed hyperlipidemia: Secondary | ICD-10-CM

## 2017-04-06 NOTE — Patient Instructions (Signed)
Calcium 1200mg  + Vit D 800IU daily Weight bearing exercise   DASH Eating Plan DASH stands for "Dietary Approaches to Stop Hypertension." The DASH eating plan is a healthy eating plan that has been shown to reduce high blood pressure (hypertension). It may also reduce your risk for type 2 diabetes, heart disease, and stroke. The DASH eating plan may also help with weight loss. What are tips for following this plan? General guidelines  Avoid eating more than 2,300 mg (milligrams) of salt (sodium) a day. If you have hypertension, you may need to reduce your sodium intake to 1,500 mg a day.  Limit alcohol intake to no more than 1 drink a day for nonpregnant women and 2 drinks a day for men. One drink equals 12 oz of beer, 5 oz of wine, or 1 oz of hard liquor.  Work with your health care provider to maintain a healthy body weight or to lose weight. Ask what an ideal weight is for you.  Get at least 30 minutes of exercise that causes your heart to beat faster (aerobic exercise) most days of the week. Activities may include walking, swimming, or biking.  Work with your health care provider or diet and nutrition specialist (dietitian) to adjust your eating plan to your individual calorie needs. Reading food labels  Check food labels for the amount of sodium per serving. Choose foods with less than 5 percent of the Daily Value of sodium. Generally, foods with less than 300 mg of sodium per serving fit into this eating plan.  To find whole grains, look for the word "whole" as the first word in the ingredient list. Shopping  Buy products labeled as "low-sodium" or "no salt added."  Buy fresh foods. Avoid canned foods and premade or frozen meals. Cooking  Avoid adding salt when cooking. Use salt-free seasonings or herbs instead of table salt or sea salt. Check with your health care provider or pharmacist before using salt substitutes.  Do not fry foods. Cook foods using healthy methods such as  baking, boiling, grilling, and broiling instead.  Cook with heart-healthy oils, such as olive, canola, soybean, or sunflower oil. Meal planning   Eat a balanced diet that includes: ? 5 or more servings of fruits and vegetables each day. At each meal, try to fill half of your plate with fruits and vegetables. ? Up to 6-8 servings of whole grains each day. ? Less than 6 oz of lean meat, poultry, or fish each day. A 3-oz serving of meat is about the same size as a deck of cards. One egg equals 1 oz. ? 2 servings of low-fat dairy each day. ? A serving of nuts, seeds, or beans 5 times each week. ? Heart-healthy fats. Healthy fats called Omega-3 fatty acids are found in foods such as flaxseeds and coldwater fish, like sardines, salmon, and mackerel.  Limit how much you eat of the following: ? Canned or prepackaged foods. ? Food that is high in trans fat, such as fried foods. ? Food that is high in saturated fat, such as fatty meat. ? Sweets, desserts, sugary drinks, and other foods with added sugar. ? Full-fat dairy products.  Do not salt foods before eating.  Try to eat at least 2 vegetarian meals each week.  Eat more home-cooked food and less restaurant, buffet, and fast food.  When eating at a restaurant, ask that your food be prepared with less salt or no salt, if possible. What foods are recommended? The items listed  may not be a complete list. Talk with your dietitian about what dietary choices are best for you. Grains Whole-grain or whole-wheat bread. Whole-grain or whole-wheat pasta. Brown rice. [place] Morrow. Bulgur. Whole-grain and low-sodium cereals. Pita bread. Low-fat, low-sodium crackers. Whole-wheat flour tortillas. Vegetables Fresh or frozen vegetables (raw, steamed, roasted, or grilled). Low-sodium or reduced-sodium tomato and vegetable juice. Low-sodium or reduced-sodium tomato sauce and tomato paste. Low-sodium or reduced-sodium canned vegetables. Fruits All fresh,  dried, or frozen fruit. Canned fruit in natural juice (without added sugar). Meat and other protein foods Skinless chicken or [place]. Ground chicken or [place]. Pork with fat trimmed off. Fish and seafood. Egg whites. Dried beans, peas, or lentils. Unsalted nuts, nut butters, and seeds. Unsalted canned beans. Lean cuts of beef with fat trimmed off. Low-sodium, lean deli meat. Dairy Low-fat (1%) or fat-free (skim) milk. Fat-free, low-fat, or reduced-fat cheeses. Nonfat, low-sodium ricotta or cottage cheese. Low-fat or nonfat yogurt. Low-fat, low-sodium cheese. Fats and oils Soft margarine without trans fats. Vegetable oil. Low-fat, reduced-fat, or light mayonnaise and salad dressings (reduced-sodium). Canola, safflower, olive, soybean, and sunflower oils. Avocado. Seasoning and other foods Herbs. Spices. Seasoning mixes without salt. Unsalted popcorn and pretzels. Fat-free sweets. What foods are not recommended? The items listed may not be a complete list. Talk with your dietitian about what dietary choices are best for you. Grains Baked goods made with fat, such as croissants, muffins, or some breads. Dry pasta or rice meal packs. Vegetables Creamed or fried vegetables. Vegetables in a cheese sauce. Regular canned vegetables (not low-sodium or reduced-sodium). Regular canned tomato sauce and paste (not low-sodium or reduced-sodium). Regular tomato and vegetable juice (not low-sodium or reduced-sodium). Angie Fava. Olives. Fruits Canned fruit in a light or heavy syrup. Fried fruit. Fruit in cream or butter sauce. Meat and other protein foods Fatty cuts of meat. Ribs. Fried meat. Berniece Salines. Sausage. Bologna and other processed lunch meats. Salami. Fatback. Hotdogs. Bratwurst. Salted nuts and seeds. Canned beans with added salt. Canned or smoked fish. Whole eggs or egg yolks. Chicken or [place] with skin. Dairy Whole or 2% milk, cream, and half-and-half. Whole or full-fat cream cheese. Whole-fat or sweetened  yogurt. Full-fat cheese. Nondairy creamers. Whipped toppings. Processed cheese and cheese spreads. Fats and oils Butter. Stick margarine. Lard. Shortening. Ghee. Bacon fat. Tropical oils, such as coconut, palm kernel, or palm oil. Seasoning and other foods Salted popcorn and pretzels. Onion salt, garlic salt, seasoned salt, table salt, and sea salt. Worcestershire sauce. Tartar sauce. Barbecue sauce. Teriyaki sauce. Soy sauce, including reduced-sodium. Steak sauce. Canned and packaged gravies. Fish sauce. Oyster sauce. Cocktail sauce. Horseradish that you find on the shelf. Ketchup. Mustard. Meat flavorings and tenderizers. Bouillon cubes. Hot sauce and Tabasco sauce. Premade or packaged marinades. Premade or packaged taco seasonings. Relishes. Regular salad dressings. Where to find more information:  National Heart, Lung, and [place]: https://wilson-eaton.com/  American Heart Association: www.heart.org Summary  The DASH eating plan is a healthy eating plan that has been shown to reduce high blood pressure (hypertension). It may also reduce your risk for type 2 diabetes, heart disease, and stroke.  With the DASH eating plan, you should limit salt (sodium) intake to 2,300 mg a day. If you have hypertension, you may need to reduce your sodium intake to 1,500 mg a day.  When on the DASH eating plan, aim to eat more fresh fruits and vegetables, whole grains, lean proteins, low-fat dairy, and heart-healthy fats.  Work with your health care provider or diet  and nutrition specialist (dietitian) to adjust your eating plan to your individual calorie needs. This information is not intended to replace advice given to you by your health care provider. Make sure you discuss any questions you have with your health care provider. Document Released: 03/25/2011 Document Revised: 03/29/2016 Document Reviewed: 03/29/2016 Elsevier Interactive Patient Education  [employer].

## 2017-04-06 NOTE — Progress Notes (Signed)
[name] Female    DOB: 07/08/1945   71 y.o.   MRN: [id number] Visit Date: 04/06/2017  Today's Provider: Margaretann LovelessJennifer M Amiyrah Lamere, PA-C   No chief complaint on file.  Subjective:    HPI  Hypertension, follow-up:  BP Readings from Last 3 Encounters:  04/06/17 (!) 142/88  10/07/16 (!) 150/88  10/05/16 (!) 150/82    She was last seen for hypertension 6 months ago.  BP at that visit was 150/88. Management since that visit includes continue Amlodipine and Ziac. She reports excellent compliance with treatment. She is not having side effects.  She is not exercising.Walking a little She is adherent to low salt diet.   Outside blood pressures are n/a. She is experiencing none.  Patient denies chest pain, chest pressure/discomfort, exertional chest pressure/discomfort, fatigue, irregular heart beat, lower extremity edema, near-syncope and palpitations.   Cardiovascular risk factors include advanced age (older than 5255 for men, 4565 for women) and hypertension.     Weight trend: stable Wt Readings from Last 3 Encounters:  04/06/17 188 lb 6.4 oz (85.5 kg)  10/07/16 192 lb 3.2 oz (87.2 kg)  10/05/16 190 lb 6.4 oz (86.4 kg)    Current diet: in general, a "healthy" diet  , when she can  ------------------------------------------------------------------------ Flu Vaccine Declined-per patient she never takes influenza vaccine.  Patient reports that her mother passed away 2-3 weeks ago.She had Dementia.  [facility] 04/06/2017 10/05/2016 10/05/2016  Decreased Interest 0 0 0  Down, Depressed, Hopeless 0 0 0  PHQ - 2 Score 0 0 0  Altered sleeping 2 1 -  Tired, decreased energy 1 2 -  Change in appetite 1 1 -  Feeling bad or failure about yourself  0 0 -  Trouble concentrating 0 0 -  Moving slowly or fidgety/restless 0 0 -  Suicidal thoughts 0 0 -  PHQ-9 Score 4 4 -  Difficult doing work/chores Not difficult at all - -     Allergies  Allergen  Reactions  . Acetaminophen-Codeine      Current Outpatient Medications:  .  amLODipine (NORVASC) 5 MG tablet, Take 1 tablet (5 mg total) by mouth daily., Disp: 90 tablet, Rfl: 3 .  bisoprolol-hydrochlorothiazide (ZIAC) 10-6.25 MG tablet, Take 1 tablet by mouth daily., Disp: 90 tablet, Rfl: 3 .  fluticasone (FLONASE) 50 MCG/ACT nasal spray, Place 2 sprays into both nostrils daily., Disp: 48 g, Rfl: 3 .  loratadine (CLARITIN) 10 MG tablet, Take 10 mg by mouth daily as needed. , Disp: , Rfl:  .  meloxicam (MOBIC) 7.5 MG tablet, Take 1 tablet (7.5 mg total) by mouth daily., Disp: 90 tablet, Rfl: 3 .  simvastatin (ZOCOR) 10 MG tablet, Take 1 tablet (10 mg total) by mouth daily at 6 PM., Disp: 90 tablet, Rfl: 3 .  Vitamin D, Cholecalciferol, 1000 units CAPS, Take 1 capsule by mouth daily., Disp: , Rfl:  .  zolpidem (AMBIEN) 5 MG tablet, TAKE 1 TABLET AT BEDTIME AS NEEDED FOR SLEEP, Disp: 90 tablet, Rfl: 1  Review of Systems  Constitutional: Negative for appetite change, chills, fatigue and fever.  Respiratory: Positive for cough (only at night). Negative for chest tightness and shortness of breath.   Cardiovascular: Negative for chest pain, palpitations and leg swelling.  Gastrointestinal: Negative for abdominal pain and nausea.  Neurological: Negative for dizziness and headaches.  Psychiatric/Behavioral: Positive for sleep disturbance (stable on ambien). Negative for decreased concentration and dysphoric mood.  Social History   Tobacco Use  . Smoking status: Never Smoker  . Smokeless tobacco: Never Used  Substance Use Topics  . Alcohol use: Yes    Comment: Occasional Wine   Objective:   BP (!) 142/88 (BP Location: Left Arm, Patient Position: Sitting, Cuff Size: Normal)   Pulse 60   Temp 98.2 F (36.8 C) (Oral)   Resp 16   Wt 188 lb 6.4 oz (85.5 kg)   BMI 34.46 kg/m    Physical Exam  Constitutional: She appears well-developed and well-nourished. No distress.  Neck: Normal  range of motion. Neck supple. No tracheal deviation present. No thyromegaly present.  Cardiovascular: Normal rate, regular rhythm and normal heart sounds. Exam reveals no gallop and no friction rub.  No murmur heard. Pulmonary/Chest: Effort normal and breath sounds normal. No respiratory distress. She has no wheezes. She has no rales.  Musculoskeletal: She exhibits no edema.  Lymphadenopathy:    She has no cervical adenopathy.  Skin: She is not diaphoretic.  Vitals reviewed.      Assessment & Plan:     1. Essential hypertension Stable on Amlodipine 5mg  and Bisoprolol-HCTZ 10-6.25mg . I will see her back in 6 months for CPE.  2. Combined fat and carbohydrate induced hyperlipemia Stable on simvastatin 10mg . Labs checked on 10/07/16 were normal.   3. Primary insomnia Stable on Ambien 5mg  nightly prn.   4. Age-related osteoporosis without current pathological fracture BMD on 12/08/16 showed a T score of -2.6 which was a decrease in lumbar spine from -2.4 in 2015. Patient does not desire treatment for osteoporosis at this time with bisphosphonates or other options. Discussed conservative management with Vit D 800 IU daily + Calcium 1200mg  daily and continue weight bearing exercises.        Margaretann LovelessJennifer M Gotti Alwin, PA-C  [facility]

## 2017-06-02 ENCOUNTER — Ambulatory Visit (INDEPENDENT_AMBULATORY_CARE_PROVIDER_SITE_OTHER): Payer: Medicare HMO | Admitting: Physician Assistant

## 2017-06-02 ENCOUNTER — Encounter: Payer: Self-pay | Admitting: Physician Assistant

## 2017-06-02 VITALS — BP 146/80 | HR 66 | Temp 98.3°F | Resp 16 | Wt 191.0 lb

## 2017-06-02 DIAGNOSIS — F5101 Primary insomnia: Secondary | ICD-10-CM

## 2017-06-02 NOTE — Patient Instructions (Signed)

## 2017-06-02 NOTE — Progress Notes (Signed)
[name] Female    DOB: 04/22/1945   72 y.o.   MRN: [id number] Visit Date: 06/02/2017  Today's Provider: Margaretann LovelessJennifer M Shaley Leavens, PA-C   Chief Complaint  Patient presents with  . Insomnia   Subjective:    HPI Patient here today to talk about staying on Ambien. Patient reports she uses medication every night and reports good tolerance and compliance with medication. Patient reports she being on Ambien for several years. Patient reports she takes medication around 10 pm and gets about 6-7 hours of restful sleep. Patient reports she did try other medications and failed them. Patient tried Lunesta 3 mg and Trazodone HCI 50 mg. Patient is upset today due to the death of her best friend.    Allergies  Allergen Reactions  . Acetaminophen-Codeine      Current Outpatient Medications:  .  amLODipine (NORVASC) 5 MG tablet, Take 1 tablet (5 mg total) by mouth daily., Disp: 90 tablet, Rfl: 3 .  bisoprolol-hydrochlorothiazide (ZIAC) 10-6.25 MG tablet, Take 1 tablet by mouth daily., Disp: 90 tablet, Rfl: 3 .  fluticasone (FLONASE) 50 MCG/ACT nasal spray, Place 2 sprays into both nostrils daily., Disp: 48 g, Rfl: 3 .  loratadine (CLARITIN) 10 MG tablet, Take 10 mg by mouth daily as needed. , Disp: , Rfl:  .  meloxicam (MOBIC) 7.5 MG tablet, Take 1 tablet (7.5 mg total) by mouth daily. (Patient taking differently: Take 7.5 mg by mouth as needed. ), Disp: 90 tablet, Rfl: 3 .  simvastatin (ZOCOR) 10 MG tablet, Take 1 tablet (10 mg total) by mouth daily at 6 PM., Disp: 90 tablet, Rfl: 3 .  Vitamin D, Cholecalciferol, 1000 units CAPS, Take 1 capsule by mouth daily., Disp: , Rfl:  .  zolpidem (AMBIEN) 5 MG tablet, TAKE 1 TABLET AT BEDTIME AS NEEDED FOR SLEEP, Disp: 90 tablet, Rfl: 1  Review of Systems  Constitutional: Negative.   Respiratory: Negative.   Cardiovascular: Negative.   Psychiatric/Behavioral: Negative.     Social History   Tobacco Use  . Smoking status: Never  Smoker  . Smokeless tobacco: Never Used  Substance Use Topics  . Alcohol use: Yes    Comment: Occasional Wine   Objective:   BP (!) 146/80 (BP Location: Left Arm, Patient Position: Sitting, Cuff Size: Large)   Pulse 66   Temp 98.3 F (36.8 C) (Oral)   Resp 16   Wt 191 lb (86.6 kg)   SpO2 98%   BMI 34.93 kg/m  Vitals:   06/02/17 0815  BP: (!) 146/80  Pulse: 66  Resp: 16  Temp: 98.3 F (36.8 C)  TempSrc: Oral  SpO2: 98%  Weight: 191 lb (86.6 kg)     Physical Exam  Constitutional: She appears well-developed and well-nourished. No distress.  Neck: Normal range of motion. Neck supple. No tracheal deviation present. No thyromegaly present.  Cardiovascular: Normal rate, regular rhythm and normal heart sounds. Exam reveals no gallop and no friction rub.  No murmur heard. Pulmonary/Chest: Effort normal and breath sounds normal. No respiratory distress. She has no wheezes. She has no rales.  Lymphadenopathy:    She has no cervical adenopathy.  Skin: She is not diaphoretic.  Psychiatric: She has a normal mood and affect. Her behavior is normal. Judgment and thought content normal.  Tearful about her friend passing last night  Vitals reviewed.       Assessment & Plan:     1. Primary insomnia Patient stable on  Ambien. Has failed other medications. Ambien PA was approved prior to this visit thus it will be no charged.       Margaretann Loveless, PA-C  [facility]

## 2017-08-09 ENCOUNTER — Telehealth: Payer: Self-pay | Admitting: Physician Assistant

## 2017-08-09 ENCOUNTER — Other Ambulatory Visit: Payer: Self-pay | Admitting: Physician Assistant

## 2017-08-09 DIAGNOSIS — G4700 Insomnia, unspecified: Secondary | ICD-10-CM

## 2017-08-09 DIAGNOSIS — I10 Essential (primary) hypertension: Secondary | ICD-10-CM

## 2017-08-09 DIAGNOSIS — E782 Mixed hyperlipidemia: Secondary | ICD-10-CM

## 2017-08-09 DIAGNOSIS — J301 Allergic rhinitis due to pollen: Secondary | ICD-10-CM

## 2017-08-09 NOTE — Telephone Encounter (Signed)
[facility] pharmacy faxed a refill request for the following medication. Thanks CC ° °fluticasone (FLONASE) 50 MCG/ACT nasal spray  ° °

## 2017-08-09 NOTE — Telephone Encounter (Signed)
Please review. KW 

## 2017-08-10 MED ORDER — FLUTICASONE PROPIONATE 50 MCG/ACT NA SUSP: 2.0000 | Freq: Every day | NASAL | 3 refills | Status: DC

## 2017-08-10 NOTE — Telephone Encounter (Signed)
Refilled

## 2017-08-12 ENCOUNTER — Other Ambulatory Visit: Payer: Self-pay | Admitting: Physician Assistant

## 2017-08-12 NOTE — Telephone Encounter (Signed)
[facility] pharmacy faxed a refill request for the following medication. Thanks CC ° °fluticasone (FLONASE) 50 MCG/ACT nasal spray  ° °

## 2017-08-12 NOTE — Telephone Encounter (Signed)
Was [facility] into [facility] on 08/10/17.

## 2017-09-05 DIAGNOSIS — H5203 Hypermetropia, bilateral: Secondary | ICD-10-CM | POA: Diagnosis not present

## 2017-09-19 ENCOUNTER — Telehealth: Payer: Self-pay

## 2017-09-19 NOTE — Telephone Encounter (Signed)
Called pt to see about changing her AWV to another day or time. Pt is scheduled now for her AWV on 09/27/17 @ 240. I will be out of the office that day at 3. Will try back again later. -MM

## 2017-09-23 NOTE — Telephone Encounter (Signed)
Spoke with pt and r/s AWV for 10/04/17 @ 1040. -MM

## 2017-09-27 ENCOUNTER — Ambulatory Visit: Payer: Medicare HMO

## 2017-10-04 ENCOUNTER — Ambulatory Visit (INDEPENDENT_AMBULATORY_CARE_PROVIDER_SITE_OTHER): Payer: Medicare HMO

## 2017-10-04 VITALS — BP 158/88 | HR 64 | Temp 99.2°F | Ht 62.0 in | Wt 187.8 lb

## 2017-10-04 DIAGNOSIS — Z0000 Encounter for general adult medical examination without abnormal findings: Secondary | ICD-10-CM | POA: Diagnosis not present

## 2017-10-04 NOTE — Progress Notes (Signed)
Subjective:   Marissa Hahn is a 72 y.o. female who presents for Medicare Annual (Subsequent) preventive examination.  Review of Systems:  N/A  Cardiac Risk Factors include: advanced age (>60mn, >>21women);dyslipidemia;hypertension;obesity (BMI >30kg/m2)     Objective:     Vitals: BP (!) 158/88 (BP Location: Right Arm)   Pulse 64   Temp 99.2 F (37.3 C) (Oral)   Ht 5' 2"  (1.575 m)   Wt 187 lb 12.8 oz (85.2 kg)   BMI 34.35 kg/m   Body mass index is 34.35 kg/m.  Advanced Directives 10/04/2017 10/07/2016 10/05/2016 03/25/2015 12/31/2014  Does Patient Have a Medical Advance Directive? No No No Yes No  Type of Advance Directive - - -Public librarianLiving will -  Does patient want to make changes to medical advance directive? No - Patient declined - - - -  Would patient like information on creating a medical advance directive? No - Patient declined - - - -    Tobacco Social History   Tobacco Use  Smoking Status Never Smoker  Smokeless Tobacco Never Used     Counseling given: Not Answered   Clinical Intake:  Pre-visit preparation completed: Yes  Pain : No/denies pain Pain Score: 0-No pain     Nutritional Status: BMI > 30  Obese Nutritional Risks: None Diabetes: No  How often do you need to have someone help you when you read instructions, pamphlets, or other written materials from your doctor or pharmacy?: 1 - Never  Interpreter Needed?: No  Information [facility] :: [facility]  History reviewed. No pertinent past medical history. Past Surgical History:  Procedure Laterality Date  . ABDOMINAL HYSTERECTOMY  1990   Abdominal  . CARPAL TUNNEL RELEASE Left 01/2010   Family History  Problem Relation Age of Onset  . Hypertension Mother   . Lung cancer Father   . Healthy Other   . Breast cancer Neg Hx    Social History   Socioeconomic History  . Marital status: Divorced    Spouse name: Not on file  . Number of children: 2  . Years  of education: H/S  . Highest education level: 12th grade  Occupational History  . Occupation: Retired  SScientific laboratory technician . Financial resource strain: Not hard at all  . Food insecurity:    Worry: Never true    Inability: Never true  . Transportation needs:    Medical: No    Non-medical: No  Tobacco Use  . Smoking status: Never Smoker  . Smokeless tobacco: Never Used  Substance and Sexual Activity  . Alcohol use: Yes    Comment: Occasional Wine  . Drug use: No  . Sexual activity: Not on file  Lifestyle  . Physical activity:    Days per week: Not on file    Minutes per session: Not on file  . Stress: Only a little  Relationships  . Social connections:    Talks on phone: Not on file    Gets together: Not on file    Attends religious service: Not on file    Active member of club or organization: Not on file    Attends meetings of clubs or organizations: Not on file    Relationship status: Not on file  Other Topics Concern  . Not on file  Social History Narrative  . Not on file    Outpatient Encounter Medications as of 10/04/2017  Medication Sig  . amLODipine (NORVASC) 5 MG tablet TAKE 1  TABLET EVERY DAY  . bisoprolol-hydrochlorothiazide (ZIAC) 10-6.25 MG tablet TAKE 1 TABLET EVERY DAY  . fluticasone (FLONASE) 50 MCG/ACT nasal spray Place 2 sprays into both nostrils daily.  Marland Kitchen loratadine (CLARITIN) 10 MG tablet Take 10 mg by mouth daily as needed.   . meloxicam (MOBIC) 7.5 MG tablet Take 1 tablet (7.5 mg total) by mouth daily. (Patient taking differently: Take 7.5 mg by mouth as needed. )  . pseudoephedrine-acetaminophen (TYLENOL SINUS) 30-500 MG TABS tablet Take 1 tablet by mouth every 4 (four) hours as needed.  . simvastatin (ZOCOR) 10 MG tablet TAKE 1 TABLET (10 MG TOTAL) BY MOUTH DAILY AT 6 PM.  . Vitamin D, Cholecalciferol, 1000 units CAPS Take 1 capsule by mouth daily.  Marland Kitchen zolpidem (AMBIEN) 5 MG tablet TAKE 1 TABLET AT BEDTIME AS NEEDED   No facility-administered  encounter medications on file as of 10/04/2017.     Activities of Daily Living In your present state of health, do you have any difficulty performing the following activities: 10/04/2017 10/05/2016  Hearing? N N  Vision? N N  Difficulty concentrating or making decisions? N N  Walking or climbing stairs? N N  Dressing or bathing? N N  Doing errands, shopping? N N  Preparing Food and eating ? N N  Using the Toilet? N N  In the past six months, have you accidently leaked urine? N N  Do you have problems with loss of bowel control? N N  Managing your Medications? N N  Managing your Finances? N N  Housekeeping or managing your Housekeeping? N N  Some recent data might be hidden    Patient Care Team: Rubye Beach as PCP - General (Family Medicine)    Assessment:   This is a routine wellness examination for Marissa Hahn.  Exercise Activities and Dietary recommendations Current Exercise Habits: Home exercise routine, Type of exercise: walking, Time (Minutes): 30, Frequency (Times/Week): 2, Weekly Exercise (Minutes/Week): 60, Intensity: Mild, Exercise limited by: None identified  Goals    . Exercise 3x per week (30 min per time)     Recommend to start walking 3 days a week for 30 minutes.        Fall Risk Fall Risk  10/04/2017 06/02/2017 10/05/2016 04/27/2016 03/25/2015  Falls in the past year? No No No No No   Is the patient's home free of loose throw rugs in walkways, pet beds, electrical cords, etc?   yes      Grab bars in the bathroom? yes      Handrails on the stairs?   yes      Adequate lighting?   yes  Timed Get Up and Go performed: N/A  Depression Screen PHQ 2/9 Scores 10/04/2017 04/06/2017 10/05/2016 10/05/2016  PHQ - 2 Score 1 0 0 0  PHQ- 9 Score - 4 4 -     Cognitive Function: Pt declined screening today.      6CIT Screen 10/05/2016  What Year? 0 points  What month? 0 points  What time? 0 points  Count back from 20 0 points  Months in reverse 0 points    Repeat phrase 0 points  Total Score 0    Immunization History  Administered Date(s) Administered  . Pneumococcal Conjugate-13 08/24/2013  . Pneumococcal Polysaccharide-23 03/25/2015    Qualifies for Shingles Vaccine? Due for Shingles vaccine. Declined my offer to administer today. Education has been provided regarding the importance of this vaccine. Pt has been advised to call her insurance company to  determine her out of pocket expense. Advised she may also receive this vaccine at her local pharmacy or Health Dept. Verbalized acceptance and understanding.  Screening Tests Health Maintenance  Topic Date Due  . COLONOSCOPY  04/19/2026 (Originally 08/01/2016)  . TETANUS/TDAP  04/19/2026 (Originally 02/25/1967)  . INFLUENZA VACCINE  11/17/2017  . MAMMOGRAM  12/09/2018  . DEXA SCAN  Completed  . Hepatitis C Screening  Completed  . PNA vac Low Risk Adult  Completed    Cancer Screenings: Lung: Low Dose CT Chest recommended if Age 43-80 years, 30 pack-year currently smoking OR have quit w/in 15years. Patient does not qualify. Breast:  Up to date on Mammogram? Yes   Up to date of Bone Density/Dexa? Yes Colorectal: Pt declines order or cologuard today.   Additional Screenings:  Hepatitis C Screening: Up to date     Plan:  I have personally reviewed and addressed the Medicare Annual Wellness questionnaire and have noted the following in the patient's chart:  A. Medical and social history B. Use of alcohol, tobacco or illicit drugs  C. Current medications and supplements D. Functional ability and status E.  Nutritional status F.  Physical activity G. Advance directives H. List of other physicians I.  Hospitalizations, surgeries, and ER visits in previous 12 months J.  Rule such as hearing and vision if needed, cognitive and depression L. Referrals and appointments - none  In addition, I have reviewed and discussed with patient certain preventive protocols,  quality metrics, and best practice recommendations. A written personalized care plan for preventive services as well as general preventive health recommendations were provided to patient.  See attached scanned questionnaire for additional information.   Signed,  Fabio Neighbors, LPN Nurse Health Advisor   Nurse Recommendations: Pt declined a colonoscopy referral and the cologuard order today. Pt states she received the cologuard kit previously and did not complete it. Pt is not interested in completing it at this time. Pt also declined the tetanus vaccine. Advised pt to monitor BP daily and to advise PCP if she notices an increase in readings.

## 2017-10-04 NOTE — Patient Instructions (Signed)
Marissa Hahn , Thank you for taking time to come for your Medicare Wellness Visit. I appreciate your ongoing commitment to your health goals. Please review the following plan we discussed and let me know if I can assist you in the future.   Screening recommendations/referrals: Colonoscopy: Pt declines today.  Mammogram: Up to date Bone Density: Up to date Recommended yearly ophthalmology/optometry visit for glaucoma screening and checkup Recommended yearly dental visit for hygiene and checkup  Vaccinations: Influenza vaccine: N/A Pneumococcal vaccine: Up to date Tdap vaccine: Pt declines today.  Shingles vaccine: Pt declines today.     Advanced directives: Advance directive discussed with you today. Even though you declined this today please call our office should you change your mind and we can give you the proper paperwork for you to fill out.  Conditions/risks identified: Obesity- Recommend to increase exercise to 3 days a week for 30 minutes or more.   Next appointment: 10/05/17 @ 9 AM with Marissa Hahn.   Preventive Care 5565 Years and Older, Female Preventive care refers to lifestyle choices and visits with your health care provider that can promote health and wellness. What does preventive care include?  A yearly physical exam. This is also called an annual well check.  Dental exams once or twice a year.  Routine eye exams. Ask your health care provider how often you should have your eyes checked.  Personal lifestyle choices, including:  Daily care of your teeth and gums.  Regular physical activity.  Eating a healthy diet.  Avoiding tobacco and drug use.  Limiting alcohol use.  Practicing safe sex.  Taking low-dose aspirin every day.  Taking vitamin and mineral supplements as recommended by your health care provider. What happens during an annual well check? The services and screenings done by your health care provider during your annual well check will  depend on your age, overall health, lifestyle risk factors, and family history of disease. Counseling  Your health care provider may ask you questions about your:  Alcohol use.  Tobacco use.  Drug use.  Emotional well-being.  Home and relationship well-being.  Sexual activity.  Eating habits.  History of falls.  Memory and ability to understand (cognition).  Work and work Astronomerenvironment.  Reproductive health. Screening  You may have the following tests or measurements:  Height, weight, and BMI.  Blood pressure.  Lipid and cholesterol levels. These may be checked every 5 years, or more frequently if you are over 10159 years old.  Skin check.  Lung cancer screening. You may have this screening every year starting at age 70 if you have a 30-pack-year history of smoking and currently smoke or have quit within the past 15 years.  Fecal occult blood test (FOBT) of the stool. You may have this test every year starting at age 70.  Flexible sigmoidoscopy or colonoscopy. You may have a sigmoidoscopy every 5 years or a colonoscopy every 10 years starting at age 70.  Hepatitis C blood test.  Hepatitis B blood test.  Sexually transmitted disease (STD) testing.  Diabetes screening. This is done by checking your blood sugar (glucose) after you have not eaten for a while (fasting). You may have this done every 1-3 years.  Bone density scan. This is done to screen for osteoporosis. You may have this done starting at age 70.  Mammogram. This may be done every 1-2 years. Talk to your health care provider about how often you should have regular mammograms. Talk with your health care provider  about your test results, treatment options, and if necessary, the need for more tests. Vaccines  Your health care provider may recommend certain vaccines, such as:  Influenza vaccine. This is recommended every year.  Tetanus, diphtheria, and acellular pertussis (Tdap, Td) vaccine. You may need a  Td booster every 10 years.  Zoster vaccine. You may need this after age 3.  Pneumococcal 13-valent conjugate (PCV13) vaccine. One dose is recommended after age 41.  Pneumococcal polysaccharide (PPSV23) vaccine. One dose is recommended after age 63. Talk to your health care provider about which screenings and vaccines you need and how often you need them. This information is not intended to replace advice given to you by your health care provider. Make sure you discuss any questions you have with your health care provider. Document Released: 05/02/2015 Document Revised: 12/24/2015 Document Reviewed: 02/04/2015 Elsevier Interactive Patient Education  2017 [place] Prevention in the Home Falls can cause injuries. They can happen to people of all ages. There are many things you can do to make your home safe and to help prevent falls. What can I do on the outside of my home?  Regularly fix the edges of walkways and driveways and fix any cracks.  Remove anything that might make you trip as you walk through a door, such as a raised step or threshold.  Trim any bushes or trees on the path to your home.  Use bright outdoor lighting.  Clear any walking paths of anything that might make someone trip, such as rocks or tools.  Regularly check to see if handrails are loose or broken. Make sure that both sides of any steps have handrails.  Any raised decks and porches should have guardrails on the edges.  Have any leaves, snow, or ice cleared regularly.  Use sand or salt on walking paths during winter.  Clean up any spills in your garage right away. This includes oil or grease spills. What can I do in the bathroom?  Use night lights.  Install grab bars by the toilet and in the tub and shower. Do not use towel bars as grab bars.  Use non-skid mats or decals in the tub or shower.  If you need to sit down in the shower, use a plastic, non-slip stool.  Keep the floor dry. Clean  up any water that spills on the floor as soon as it happens.  Remove soap buildup in the tub or shower regularly.  Attach bath mats securely with double-sided non-slip rug tape.  Do not have throw rugs and other things on the floor that can make you trip. What can I do in the bedroom?  Use night lights.  Make sure that you have a light by your bed that is easy to reach.  Do not use any sheets or blankets that are too big for your bed. They should not hang down onto the floor.  Have a firm chair that has side arms. You can use this for support while you get dressed.  Do not have throw rugs and other things on the floor that can make you trip. What can I do in the kitchen?  Clean up any spills right away.  Avoid walking on wet floors.  Keep items that you use a lot in easy-to-reach places.  If you need to reach something above you, use a strong step stool that has a grab bar.  Keep electrical cords out of the way.  Do not use floor polish or  wax that makes floors slippery. If you must use wax, use non-skid floor wax.  Do not have throw rugs and other things on the floor that can make you trip. What can I do with my stairs?  Do not leave any items on the stairs.  Make sure that there are handrails on both sides of the stairs and use them. Fix handrails that are broken or loose. Make sure that handrails are as long as the stairways.  Check any carpeting to make sure that it is firmly attached to the stairs. Fix any carpet that is loose or worn.  Avoid having throw rugs at the top or bottom of the stairs. If you do have throw rugs, attach them to the floor with carpet tape.  Make sure that you have a light switch at the top of the stairs and the bottom of the stairs. If you do not have them, ask someone to add them for you. What else can I do to help prevent falls?  Wear shoes that:  Do not have high heels.  Have rubber bottoms.  Are comfortable and fit you well.  Are  closed at the toe. Do not wear sandals.  If you use a stepladder:  Make sure that it is fully opened. Do not climb a closed stepladder.  Make sure that both sides of the stepladder are locked into place.  Ask someone to hold it for you, if possible.  Clearly mark and make sure that you can see:  Any grab bars or handrails.  First and last steps.  Where the edge of each step is.  Use tools that help you move around (mobility aids) if they are needed. These include:  Canes.  Walkers.  Scooters.  Crutches.  Turn on the lights when you go into a dark area. Replace any light bulbs as soon as they burn out.  Set up your furniture so you have a clear path. Avoid moving your furniture around.  If any of your floors are uneven, fix them.  If there are any pets around you, be aware of where they are.  Review your medicines with your doctor. Some medicines can make you feel dizzy. This can increase your chance of falling. Ask your doctor what other things that you can do to help prevent falls. This information is not intended to replace advice given to you by your health care provider. Make sure you discuss any questions you have with your health care provider. Document Released: 01/30/2009 Document Revised: 09/11/2015 Document Reviewed: 05/10/2014 Elsevier Interactive Patient Education  2017 [employer].

## 2017-10-05 ENCOUNTER — Encounter: Payer: Self-pay | Admitting: Physician Assistant

## 2017-10-05 ENCOUNTER — Ambulatory Visit (INDEPENDENT_AMBULATORY_CARE_PROVIDER_SITE_OTHER): Payer: Medicare HMO | Admitting: Physician Assistant

## 2017-10-05 VITALS — BP 148/78 | HR 60 | Temp 98.1°F | Resp 16 | Ht 62.0 in | Wt 188.0 lb

## 2017-10-05 DIAGNOSIS — Z1231 Encounter for screening mammogram for malignant neoplasm of breast: Secondary | ICD-10-CM

## 2017-10-05 DIAGNOSIS — Z0100 Encounter for examination of eyes and vision without abnormal findings: Secondary | ICD-10-CM | POA: Diagnosis not present

## 2017-10-05 DIAGNOSIS — R739 Hyperglycemia, unspecified: Secondary | ICD-10-CM

## 2017-10-05 DIAGNOSIS — E782 Mixed hyperlipidemia: Secondary | ICD-10-CM | POA: Diagnosis not present

## 2017-10-05 DIAGNOSIS — E559 Vitamin D deficiency, unspecified: Secondary | ICD-10-CM

## 2017-10-05 DIAGNOSIS — Z1239 Encounter for other screening for malignant neoplasm of breast: Secondary | ICD-10-CM

## 2017-10-05 DIAGNOSIS — I10 Essential (primary) hypertension: Secondary | ICD-10-CM

## 2017-10-05 NOTE — Progress Notes (Signed)
[name], Female    DOB: 05-15-1945, 72 y.o.   MRN: [id number] Visit Date: 10/05/2017  Today's Provider: Margaretann Loveless, PA-C   Chief Complaint  Patient presents with  . Hypertension   Subjective:    Hypertension, follow-up:  BP Readings from Last 3 Encounters:  10/05/17 (!) 148/78  10/04/17 (!) 158/88  06/02/17 (!) 146/80    She was last seen for hypertension 6 months ago.  BP at that visit was 142/88. Management changes since that visit include no changes. She reports excellent compliance with treatment. She is not having side effects.  She is exercising. She is adherent to low salt diet.   Outside blood pressures are stable. She is experiencing none.  Patient denies chest pain and lower extremity edema.   Cardiovascular risk factors include hypertension and obesity (BMI >= 30 kg/m2).  Use of agents associated with hypertension: none.     Weight trend: stable Wt Readings from Last 3 Encounters:  10/05/17 188 lb (85.3 kg)  10/04/17 187 lb 12.8 oz (85.2 kg)  06/02/17 191 lb (86.6 kg)    Current diet: in general, a "healthy" diet   ------------------------------------------------------------------------   Review of Systems  Constitutional: Positive for fatigue.  HENT: Positive for sinus pressure and sneezing.   Eyes: Negative.   Respiratory: Negative.   Cardiovascular: Negative.   Gastrointestinal: Negative.   Endocrine: Negative.   Genitourinary: Negative.   Musculoskeletal: Positive for back pain.  Skin: Negative.   Allergic/Immunologic: Positive for environmental allergies.  Neurological: Negative.   Hematological: Negative.   Psychiatric/Behavioral: Negative.     Social History      She  reports that she has never smoked. She has never used smokeless tobacco. She reports that she drinks alcohol. She reports that she does not use drugs.       Social History   Socioeconomic History  . Marital status: Divorced   Spouse name: Not on file  . Number of children: 2  . Years of education: H/S  . Highest education level: 12th grade  Occupational History  . Occupation: Retired  Engineer, production  . Financial resource strain: Not hard at all  . Food insecurity:    Worry: Never true    Inability: Never true  . Transportation needs:    Medical: No    Non-medical: No  Tobacco Use  . Smoking status: Never Smoker  . Smokeless tobacco: Never Used  Substance and Sexual Activity  . Alcohol use: Yes    Comment: Occasional Wine  . Drug use: No  . Sexual activity: Not on file  Lifestyle  . Physical activity:    Days per week: Not on file    Minutes per session: Not on file  . Stress: Only a little  Relationships  . Social connections:    Talks on phone: Not on file    Gets together: Not on file    Attends religious service: Not on file    Active member of club or organization: Not on file    Attends meetings of clubs or organizations: Not on file    Relationship status: Not on file  Other Topics Concern  . Not on file  Social History Narrative  . Not on file    No past medical history on file.   Patient Active Problem List   Diagnosis Date Noted  . Osteoporosis 12/08/2016  . Benign paroxysmal positional nystagmus 12/31/2014  . Carpal tunnel syndrome 12/31/2014  .  Blood glucose elevated 12/31/2014  . Avitaminosis D 10/09/2009  . Allergic rhinitis 07/29/2009  . 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 BP (high blood pressure) 10/09/2001  . Cannot sleep 10/09/2001    Past Surgical History:  Procedure Laterality Date  . ABDOMINAL HYSTERECTOMY  1990   Abdominal  . CARPAL TUNNEL RELEASE Left 01/2010    Family History        Family Status  Relation Name Status  . Mother  Deceased  . Father  Deceased at age Mid 61's       Lung Cancer  . Other 6 Siblings Alive  . Sister  Alive  . Brother  Alive  . Daughter  Alive  . Sister  Alive  . Sister  Alive  . Brother   Alive  . Brother  Alive  . Daughter  Alive  . Neg Hx  (Not Specified)        Her family history includes Healthy in her other; Hypertension in her mother, sister, sister, and sister; Lung cancer in her father. There is no history of Breast cancer.      Allergies  Allergen Reactions  . Acetaminophen-Codeine      Current Outpatient Medications:  .  amLODipine (NORVASC) 5 MG tablet, TAKE 1 TABLET EVERY DAY, Disp: 90 tablet, Rfl: 3 .  bisoprolol-hydrochlorothiazide (ZIAC) 10-6.25 MG tablet, TAKE 1 TABLET EVERY DAY, Disp: 90 tablet, Rfl: 3 .  fluticasone (FLONASE) 50 MCG/ACT nasal spray, Place 2 sprays into both nostrils daily., Disp: 48 g, Rfl: 3 .  loratadine (CLARITIN) 10 MG tablet, Take 10 mg by mouth daily as needed. , Disp: , Rfl:  .  meloxicam (MOBIC) 7.5 MG tablet, Take 1 tablet (7.5 mg total) by mouth daily. (Patient taking differently: Take 7.5 mg by mouth as needed. ), Disp: 90 tablet, Rfl: 3 .  pseudoephedrine-acetaminophen (TYLENOL SINUS) 30-500 MG TABS tablet, Take 1 tablet by mouth every 4 (four) hours as needed., Disp: , Rfl:  .  simvastatin (ZOCOR) 10 MG tablet, TAKE 1 TABLET (10 MG TOTAL) BY MOUTH DAILY AT 6 PM., Disp: 90 tablet, Rfl: 3 .  Vitamin D, Cholecalciferol, 1000 units CAPS, Take 1 capsule by mouth daily., Disp: , Rfl:  .  zolpidem (AMBIEN) 5 MG tablet, TAKE 1 TABLET AT BEDTIME AS NEEDED, Disp: 90 tablet, Rfl: 1   Patient Care Team: Margaretann Loveless, PA-C as PCP - General (Family Medicine)      Objective:   Vitals: BP (!) 148/78 (BP Location: Left Arm, Patient Position: Sitting, Cuff Size: Large)   Pulse 60   Temp 98.1 F (36.7 C)   Resp 16   Ht 5\' 2"  (1.575 m)   Wt 188 lb (85.3 kg)   SpO2 97%   BMI 34.39 kg/m    Vitals:   10/05/17 0921  BP: (!) 148/78  Pulse: 60  Resp: 16  Temp: 98.1 F (36.7 C)  SpO2: 97%  Weight: 188 lb (85.3 kg)  Height: 5\' 2"  (1.575 m)     Physical Exam  Constitutional: She is oriented to person, place, and time.  She appears well-developed and well-nourished. No distress.  HENT:  Head: Normocephalic and atraumatic.  Right Ear: Hearing, tympanic membrane, external ear and ear canal normal.  Left Ear: Hearing, tympanic membrane, external ear and ear canal normal.  Nose: Nose normal.  Mouth/Throat: Uvula is midline, oropharynx is clear and moist and mucous membranes are normal. No oropharyngeal exudate.  Eyes: Pupils are equal, round, and  reactive to light. Conjunctivae and EOM are normal. Right eye exhibits no discharge. Left eye exhibits no discharge. No scleral icterus.  Neck: Normal range of motion. Neck supple. No JVD present. Carotid bruit is not present. No tracheal deviation present. No thyromegaly present.  Cardiovascular: Normal rate, regular rhythm, normal heart sounds and intact distal pulses. Exam reveals no gallop and no friction rub.  No murmur heard. Pulmonary/Chest: Effort normal and breath sounds normal. No respiratory distress. She has no wheezes. She has no rales. She exhibits no tenderness. Right breast exhibits no inverted nipple, no mass, no nipple discharge, no skin change and no tenderness. Left breast exhibits no inverted nipple, no mass, no nipple discharge, no skin change and no tenderness. No breast swelling, tenderness, discharge or bleeding. Breasts are symmetrical.  Abdominal: Soft. Bowel sounds are normal. She exhibits no distension and no mass. There is no tenderness. There is no rebound and no guarding.  Musculoskeletal: Normal range of motion. She exhibits no edema or tenderness.  Lymphadenopathy:    She has no cervical adenopathy.  Neurological: She is alert and oriented to person, place, and time.  Skin: Skin is warm and dry. No rash noted. She is not diaphoretic.  Psychiatric: She has a normal mood and affect. Her behavior is normal. Judgment and thought content normal.  Vitals reviewed.    Depression Screen PHQ 2/9 Scores 10/04/2017 04/06/2017 10/05/2016 10/05/2016    PHQ - 2 Score 1 0 0 0  PHQ- 9 Score - 4 4 -      Assessment & Plan:     Routine Health Maintenance and Physical Exam  Exercise Activities and Dietary recommendations Goals    . Exercise 3x per week (30 min per time)     Recommend to start walking 3 days a week for 30 minutes.        Immunization History  Administered Date(s) Administered  . Pneumococcal Conjugate-13 08/24/2013  . Pneumococcal Polysaccharide-23 03/25/2015    Health Maintenance  Topic Date Due  . COLONOSCOPY  04/19/2026 (Originally 08/01/2016)  . TETANUS/TDAP  04/19/2026 (Originally 02/25/1967)  . INFLUENZA VACCINE  11/17/2017  . MAMMOGRAM  12/09/2018  . DEXA SCAN  Completed  . Hepatitis C Screening  Completed  . PNA vac Low Risk Adult  Completed     Discussed health benefits of physical activity, and encouraged her to engage in regular exercise appropriate for her age and condition.    1. Essential hypertension Stable. Continue Ziac 10-6.25mg  daily and amlodipine 5mg  daily. Will check labs as below and f/u pending results. - CBC with Differential/Platelet - Comprehensive metabolic panel - Lipid Panel With LDL/HDL Ratio - TSH - Hemoglobin A1c  2. Avitaminosis D H/O this. On OTC supplement. Postmenopausal with osteoporosis. Will check labs as below and f/u pending results. - CBC with Differential/Platelet - Comprehensive metabolic panel - Vitamin D (25 hydroxy)  3. Blood glucose elevated H/O this. Diet controlled. Will check labs as below and f/u pending results. - CBC with Differential/Platelet - Comprehensive metabolic panel - Lipid Panel With LDL/HDL Ratio 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 Continue Simvsatatin 10mg . Will check labs as below and f/u pending results. - CBC with Differential/Platelet - Comprehensive metabolic panel - Lipid Panel With LDL/HDL Ratio - Hemoglobin A1c  5. Breast cancer screening Breast exam today was normal.  There is no family history of breast cancer. She does perform regular self breast exams. Mammogram was ordered as below. [facility] was given to patient so she may schedule her mammogram at her convenience. - MM Digital Screening; Future  --------------------------------------------------------------------    Margaretann Loveless, PA-C  [facility]

## 2017-10-05 NOTE — Patient Instructions (Signed)
Health Maintenance for Postmenopausal Women Menopause is a normal process in which your reproductive ability comes to an end. This process happens gradually over a span of months to years, usually between the ages of 22 and 9. Menopause is complete when you have missed 12 consecutive menstrual periods. It is important to talk with your health care provider about some of the most common conditions that affect postmenopausal women, such as heart disease, cancer, and bone loss (osteoporosis). Adopting a healthy lifestyle and getting preventive care can help to promote your health and wellness. Those actions can also lower your chances of developing some of these common conditions. What should I know about menopause? During menopause, you may experience a number of symptoms, such as:  Moderate-to-severe hot flashes.  Night sweats.  Decrease in sex drive.  Mood swings.  Headaches.  Tiredness.  Irritability.  Memory problems.  Insomnia.  Choosing to treat or not to treat menopausal changes is an individual decision that you make with your health care provider. What should I know about hormone replacement therapy and supplements? Hormone therapy products are effective for treating symptoms that are associated with menopause, such as hot flashes and night sweats. Hormone replacement carries certain risks, especially as you become older. If you are thinking about using estrogen or estrogen with progestin treatments, discuss the benefits and risks with your health care provider. What should I know about heart disease and stroke? Heart disease, heart attack, and stroke become more likely as you age. This may be due, in part, to the hormonal changes that your body experiences during menopause. These can affect how your body processes dietary fats, triglycerides, and cholesterol. Heart attack and stroke are both medical emergencies. There are many things that you can do to help prevent heart disease  and stroke:  Have your blood pressure checked at least every 1-2 years. High blood pressure causes heart disease and increases the risk of stroke.  If you are 53-22 years old, ask your health care provider if you should take aspirin to prevent a heart attack or a stroke.  Do not use any tobacco products, including cigarettes, chewing tobacco, or electronic cigarettes. If you need help quitting, ask your health care provider.  It is important to eat a healthy diet and maintain a healthy weight. ? Be sure to include plenty of vegetables, fruits, low-fat dairy products, and lean protein. ? Avoid eating foods that are high in solid fats, added sugars, or salt (sodium).  Get regular exercise. This is one of the most important things that you can do for your health. ? Try to exercise for at least 150 minutes each week. The type of exercise that you do should increase your heart rate and make you sweat. This is known as moderate-intensity exercise. ? Try to do strengthening exercises at least twice each week. Do these in addition to the moderate-intensity exercise.  Know your numbers.Ask your health care provider to check your cholesterol and your blood glucose. Continue to have your blood tested as directed by your health care provider.  What should I know about cancer screening? There are several types of cancer. Take the following steps to reduce your risk and to catch any cancer development as early as possible. Breast Cancer  Practice breast self-awareness. ? This means understanding how your breasts normally appear and feel. ? It also means doing regular breast self-exams. Let your health care provider know about any changes, no matter how small.  If you are 40  or older, have a clinician do a breast exam (clinical breast exam or CBE) every year. Depending on your age, family history, and medical history, it may be recommended that you also have a yearly breast X-ray (mammogram).  If you  have a family history of breast cancer, talk with your health care provider about genetic screening.  If you are at high risk for breast cancer, talk with your health care provider about having an MRI and a mammogram every year.  Breast cancer (BRCA) gene test is recommended for women who have family members with BRCA-related cancers. Results of the assessment will determine the need for genetic counseling and BRCA1 and for BRCA2 testing. BRCA-related cancers include these types: ? Breast. This occurs in males or females. ? Ovarian. ? Tubal. This may also be called fallopian tube cancer. ? Cancer of the abdominal or pelvic lining (peritoneal cancer). ? Prostate. ? Pancreatic.  Cervical, Uterine, and Ovarian Cancer Your health care provider may recommend that you be screened regularly for cancer of the pelvic organs. These include your ovaries, uterus, and vagina. This screening involves a pelvic exam, which includes checking for microscopic changes to the surface of your cervix (Pap test).  For women ages 21-65, health care providers may recommend a pelvic exam and a Pap test every three years. For women ages 29-65, they may recommend the Pap test and pelvic exam, combined with testing for human papilloma virus (HPV), every five years. Some types of HPV increase your risk of cervical cancer. Testing for HPV may also be done on women of any age who have unclear Pap test results.  Other health care providers may not recommend any screening for nonpregnant women who are considered low risk for pelvic cancer and have no symptoms. Ask your health care provider if a screening pelvic exam is right for you.  If you have had past treatment for cervical cancer or a condition that could lead to cancer, you need Pap tests and screening for cancer for at least 20 years after your treatment. If Pap tests have been discontinued for you, your risk factors (such as having a new sexual partner) need to be  reassessed to determine if you should start having screenings again. Some women have medical problems that increase the chance of getting cervical cancer. In these cases, your health care provider may recommend that you have screening and Pap tests more often.  If you have a family history of uterine cancer or ovarian cancer, talk with your health care provider about genetic screening.  If you have vaginal bleeding after reaching menopause, tell your health care provider.  There are currently no reliable tests available to screen for ovarian cancer.  Lung Cancer Lung cancer screening is recommended for adults 15-60 years old who are at high risk for lung cancer because of a history of smoking. A yearly low-dose CT scan of the lungs is recommended if you:  Currently smoke.  Have a history of at least 30 pack-years of smoking and you currently smoke or have quit within the past 15 years. A pack-year is smoking an average of one pack of cigarettes per day for one year.  Yearly screening should:  Continue until it has been 15 years since you quit.  Stop if you develop a health problem that would prevent you from having lung cancer treatment.  Colorectal Cancer  This type of cancer can be detected and can often be prevented.  Routine colorectal cancer screening usually begins at  age 67 and continues through age 69.  If you have risk factors for colon cancer, your health care provider may recommend that you be screened at an earlier age.  If you have a family history of colorectal cancer, talk with your health care provider about genetic screening.  Your health care provider may also recommend using home test kits to check for hidden blood in your stool.  A small camera at the end of a tube can be used to examine your colon directly (sigmoidoscopy or colonoscopy). This is done to check for the earliest forms of colorectal cancer.  Direct examination of the colon should be repeated every  5-10 years until age 25. However, if early forms of precancerous polyps or small growths are found or if you have a family history or genetic risk for colorectal cancer, you may need to be screened more often.  Skin Cancer  Check your skin from head to toe regularly.  Monitor any moles. Be sure to tell your health care provider: ? About any new moles or changes in moles, especially if there is a change in a mole's shape or color. ? If you have a mole that is larger than the size of a pencil eraser.  If any of your family members has a history of skin cancer, especially at a young age, talk with your health care provider about genetic screening.  Always use sunscreen. Apply sunscreen liberally and repeatedly throughout the day.  Whenever you are outside, protect yourself by wearing long sleeves, pants, a wide-brimmed hat, and sunglasses.  What should I know about osteoporosis? Osteoporosis is a condition in which bone destruction happens more quickly than new bone creation. After menopause, you may be at an increased risk for osteoporosis. To help prevent osteoporosis or the bone fractures that can happen because of osteoporosis, the following is recommended:  If you are 28-75 years old, get at least 1,000 mg of calcium and at least 600 mg of vitamin D per day.  If you are older than age 50 but younger than age 64, get at least 1,200 mg of calcium and at least 600 mg of vitamin D per day.  If you are older than age 37, get at least 1,200 mg of calcium and at least 800 mg of vitamin D per day.  Smoking and excessive alcohol intake increase the risk of osteoporosis. Eat foods that are rich in calcium and vitamin D, and do weight-bearing exercises several times each week as directed by your health care provider. What should I know about how menopause affects my mental health? Depression may occur at any age, but it is more common as you become older. Common symptoms of depression  include:  Low or sad mood.  Changes in sleep patterns.  Changes in appetite or eating patterns.  Feeling an overall lack of motivation or enjoyment of activities that you previously enjoyed.  Frequent crying spells.  Talk with your health care provider if you think that you are experiencing depression. What should I know about immunizations? It is important that you get and maintain your immunizations. These include:  Tetanus, diphtheria, and pertussis (Tdap) booster vaccine.  Influenza every year before the flu season begins.  Pneumonia vaccine.  Shingles vaccine.  Your health care provider may also recommend other immunizations. This information is not intended to replace advice given to you by your health care provider. Make sure you discuss any questions you have with your health care provider. Document Released: 05/28/2005  Document Revised: 10/24/2015 Document Reviewed: 01/07/2015 Elsevier Interactive Patient Education  2018 [employer]  

## 2017-10-06 ENCOUNTER — Telehealth: Payer: Self-pay

## 2017-10-06 LAB — LIPID PANEL WITH LDL/HDL RATIO
Cholesterol, Total: 164 mg/dL (ref 100–199)
HDL: 42 mg/dL (ref >39)
LDL Calculated: 103 mg/dL — ABNORMAL HIGH (ref 0–99)
LDL/HDL RATIO: 2.5 ratio (ref 0.0–3.2)
Triglycerides: 97 mg/dL (ref 0–149)
VLDL CHOLESTEROL CAL: 19 mg/dL (ref 5–40)

## 2017-10-06 LAB — CBC WITH DIFFERENTIAL/PLATELET
BASOS: 0 %
Basophils Absolute: 0 10*3/uL (ref 0.0–0.2)
EOS (ABSOLUTE): 0.2 10*3/uL (ref 0.0–0.4)
EOS: 2 %
HEMOGLOBIN: 12.9 g/dL (ref 11.1–15.9)
Hematocrit: 39.5 % (ref 34.0–46.6)
IMMATURE GRANS (ABS): 0 10*3/uL (ref 0.0–0.1)
IMMATURE GRANULOCYTES: 0 %
Lymphocytes Absolute: 3.3 10*3/uL — ABNORMAL HIGH (ref 0.7–3.1)
Lymphs: 33 %
MCH: 28 pg (ref 26.6–33.0)
MCHC: 32.7 g/dL (ref 31.5–35.7)
MCV: 86 fL (ref 79–97)
MONOCYTES: 8 %
Monocytes Absolute: 0.8 10*3/uL (ref 0.1–0.9)
NEUTROS ABS: 5.6 10*3/uL (ref 1.4–7.0)
NEUTROS PCT: 57 %
Platelets: 390 10*3/uL (ref 150–450)
RBC: 4.61 x10E6/uL (ref 3.77–5.28)
RDW: 13.6 % (ref 12.3–15.4)
WBC: 10 10*3/uL (ref 3.4–10.8)

## 2017-10-06 LAB — COMPREHENSIVE METABOLIC PANEL
A/G RATIO: 1.6 (ref 1.2–2.2)
ALBUMIN: 4.4 g/dL (ref 3.6–4.8)
ALT: 9 IU/L (ref 0–32)
AST: 15 IU/L (ref 0–40)
Alkaline Phosphatase: 97 IU/L (ref 39–117)
BUN / CREAT RATIO: 11 — AB (ref 12–28)
BUN: 10 mg/dL (ref 8–27)
Bilirubin Total: 0.3 mg/dL (ref 0.0–1.2)
CALCIUM: 9.4 mg/dL (ref 8.7–10.3)
CO2: 21 mmol/L (ref 20–29)
Chloride: 104 mmol/L (ref 96–106)
Creatinine, Ser: 0.88 mg/dL (ref 0.57–1.00)
GFR, EST AFRICAN AMERICAN: 78 mL/min/{1.73_m2} (ref >59)
GFR, EST NON AFRICAN AMERICAN: 67 mL/min/{1.73_m2} (ref >59)
Globulin, Total: 2.8 g/dL (ref 1.5–4.5)
Glucose: 102 mg/dL — ABNORMAL HIGH (ref 65–99)
POTASSIUM: 3.9 mmol/L (ref 3.5–5.2)
Sodium: 141 mmol/L (ref 134–144)
TOTAL PROTEIN: 7.2 g/dL (ref 6.0–8.5)

## 2017-10-06 LAB — TSH: TSH: 1.89 u[IU]/mL (ref 0.450–4.500)

## 2017-10-06 LAB — HEMOGLOBIN A1C
Est. average glucose Bld gHb Est-mCnc: 123 mg/dL
HEMOGLOBIN A1C: 5.9 % — AB (ref 4.8–5.6)

## 2017-10-06 LAB — VITAMIN D 25 HYDROXY (VIT D DEFICIENCY, FRACTURES): Vit D, 25-Hydroxy: 20.3 ng/mL — ABNORMAL LOW (ref 30.0–100.0)

## 2017-10-06 NOTE — Telephone Encounter (Signed)
-----   Message from Margaretann LovelessJennifer M Burnette, PA-C sent at 10/06/2017  8:51 AM EDT ----- Vit D remains low. Still recommend taking Vit D supplement daily. All other labs are WNL and stable compared to last year.

## 2017-10-06 NOTE — Telephone Encounter (Signed)
Patient advised as directed below.  Thanks,  -Joseline 

## 2017-10-24 ENCOUNTER — Telehealth: Payer: Self-pay

## 2017-10-24 NOTE — Telephone Encounter (Signed)
Patient advised as directed.  Thanks,  -Khayri Kargbo 

## 2017-10-24 NOTE — Telephone Encounter (Signed)
If she does not have dizziness she does not need to take. But she probably needs an appt if it is still happening.

## 2017-10-24 NOTE — Telephone Encounter (Signed)
Patient called saying that she has had nausea and dizziness for the last 4 days. She reports that she started taking dramamine OTC to help with her symptoms. She reports that she only vomited during the first day of her symptoms. She reports that she could not keep liquids or solids down.  She wanted to let you know that she has been on the dramamine for about 3 days now, and she is starting to feel a little better. How long does she need to stay on the medication? She is afraid that when she stops the medication, her symptoms could return. Please advise. Contact number is correct. Thanks!

## 2018-01-30 ENCOUNTER — Other Ambulatory Visit: Payer: Self-pay | Admitting: Physician Assistant

## 2018-01-30 DIAGNOSIS — G4700 Insomnia, unspecified: Secondary | ICD-10-CM

## 2018-06-13 ENCOUNTER — Other Ambulatory Visit: Payer: Self-pay | Admitting: Physician Assistant

## 2018-06-13 DIAGNOSIS — I10 Essential (primary) hypertension: Secondary | ICD-10-CM

## 2018-07-14 ENCOUNTER — Other Ambulatory Visit: Payer: Self-pay | Admitting: Physician Assistant

## 2018-07-14 DIAGNOSIS — G4700 Insomnia, unspecified: Secondary | ICD-10-CM

## 2018-07-14 MED ORDER — ZOLPIDEM TARTRATE 5 MG PO TABS: 5.0000 mg | ORAL_TABLET | Freq: Every evening | ORAL | 1 refills | Status: DC | PRN

## 2018-07-14 NOTE — Telephone Encounter (Signed)
[facility] faxed refill request for the following medications:  zolpidem (AMBIEN) 5 MG tablet    Please advise.

## 2018-08-22 ENCOUNTER — Other Ambulatory Visit: Payer: Self-pay | Admitting: Physician Assistant

## 2018-08-22 DIAGNOSIS — I10 Essential (primary) hypertension: Secondary | ICD-10-CM

## 2018-09-13 ENCOUNTER — Other Ambulatory Visit: Payer: Self-pay | Admitting: Physician Assistant

## 2018-09-13 DIAGNOSIS — J301 Allergic rhinitis due to pollen: Secondary | ICD-10-CM

## 2018-09-13 DIAGNOSIS — E782 Mixed hyperlipidemia: Secondary | ICD-10-CM

## 2018-09-23 IMAGING — MG MM DIGITAL SCREENING BILAT W/ CAD
4 series · 4 of 4 positions shown · non-contrast
Comparison: Previous exam(s).

ACR Breast Density Category a: The breast tissue is almost entirely
fatty.

CLINICAL DATA: Screening.

EXAM:
DIGITAL SCREENING BILATERAL MAMMOGRAM WITH CAD

[L MLO]
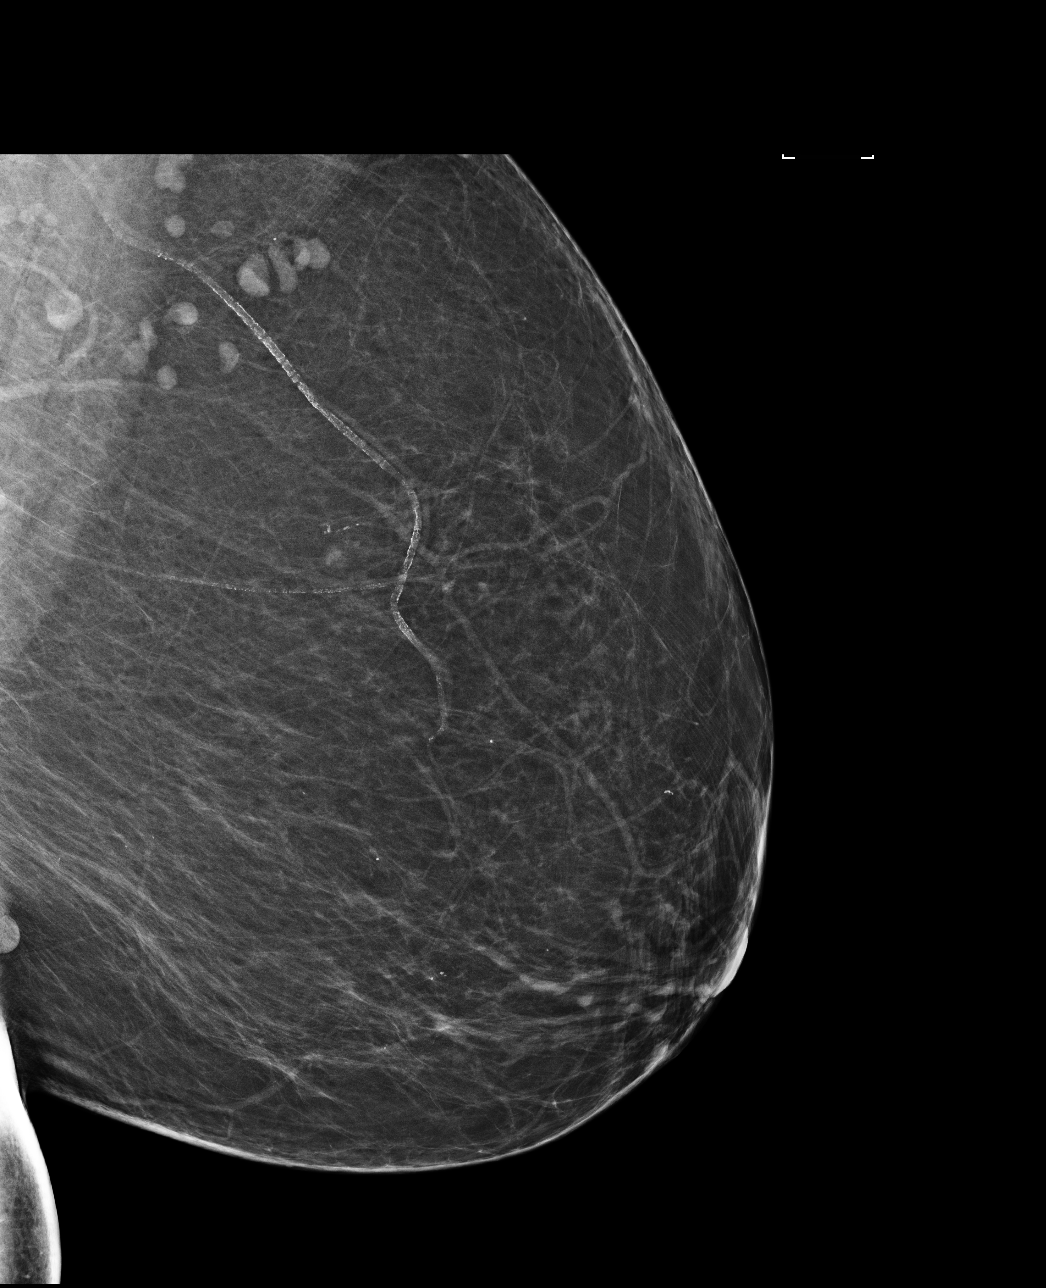

[R MLO]
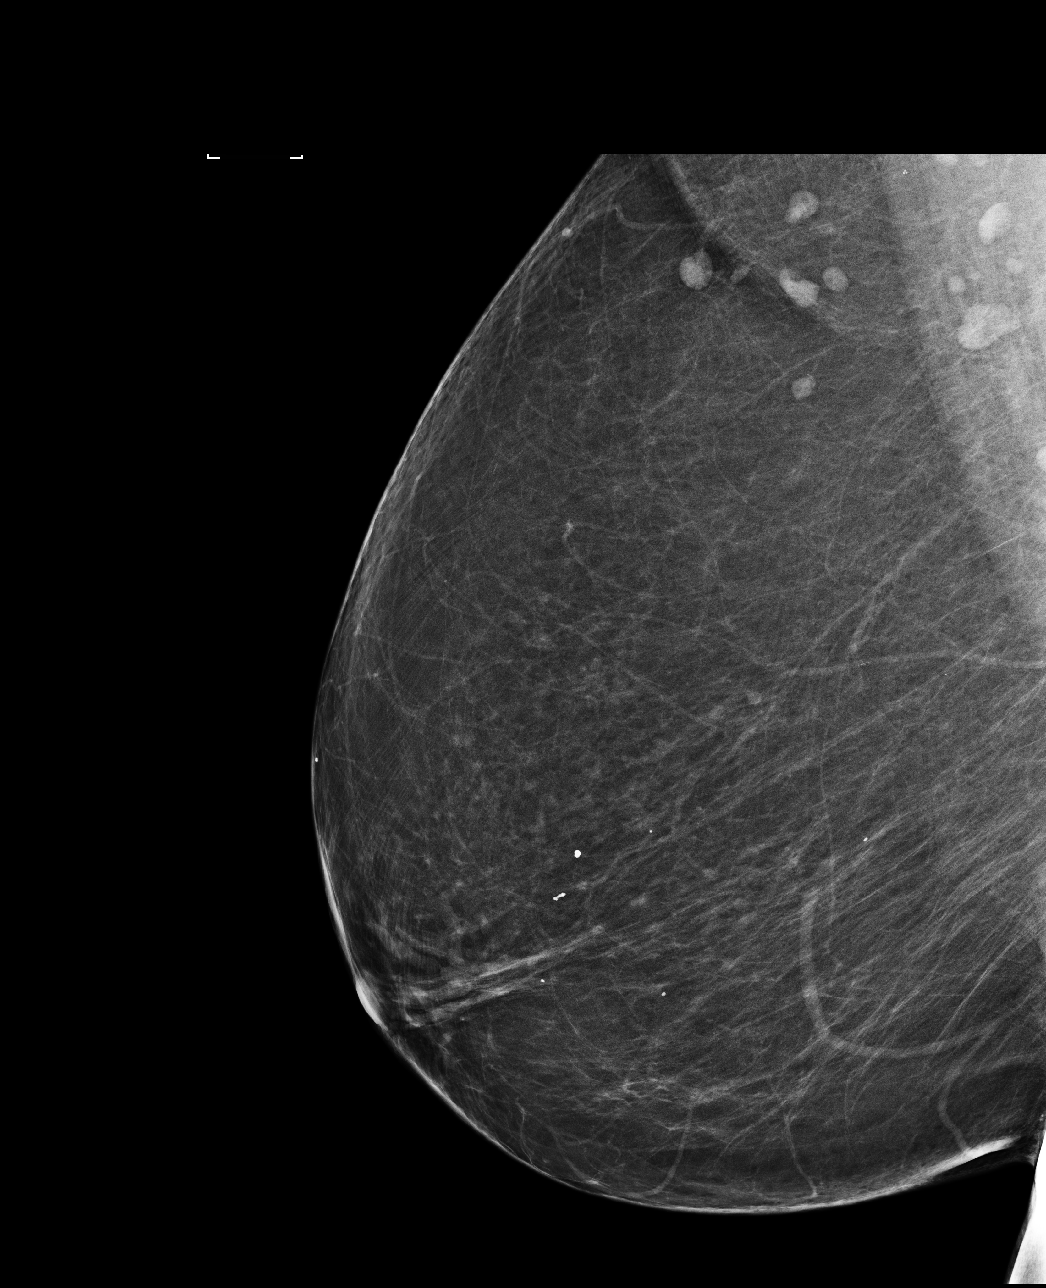

[L CC]
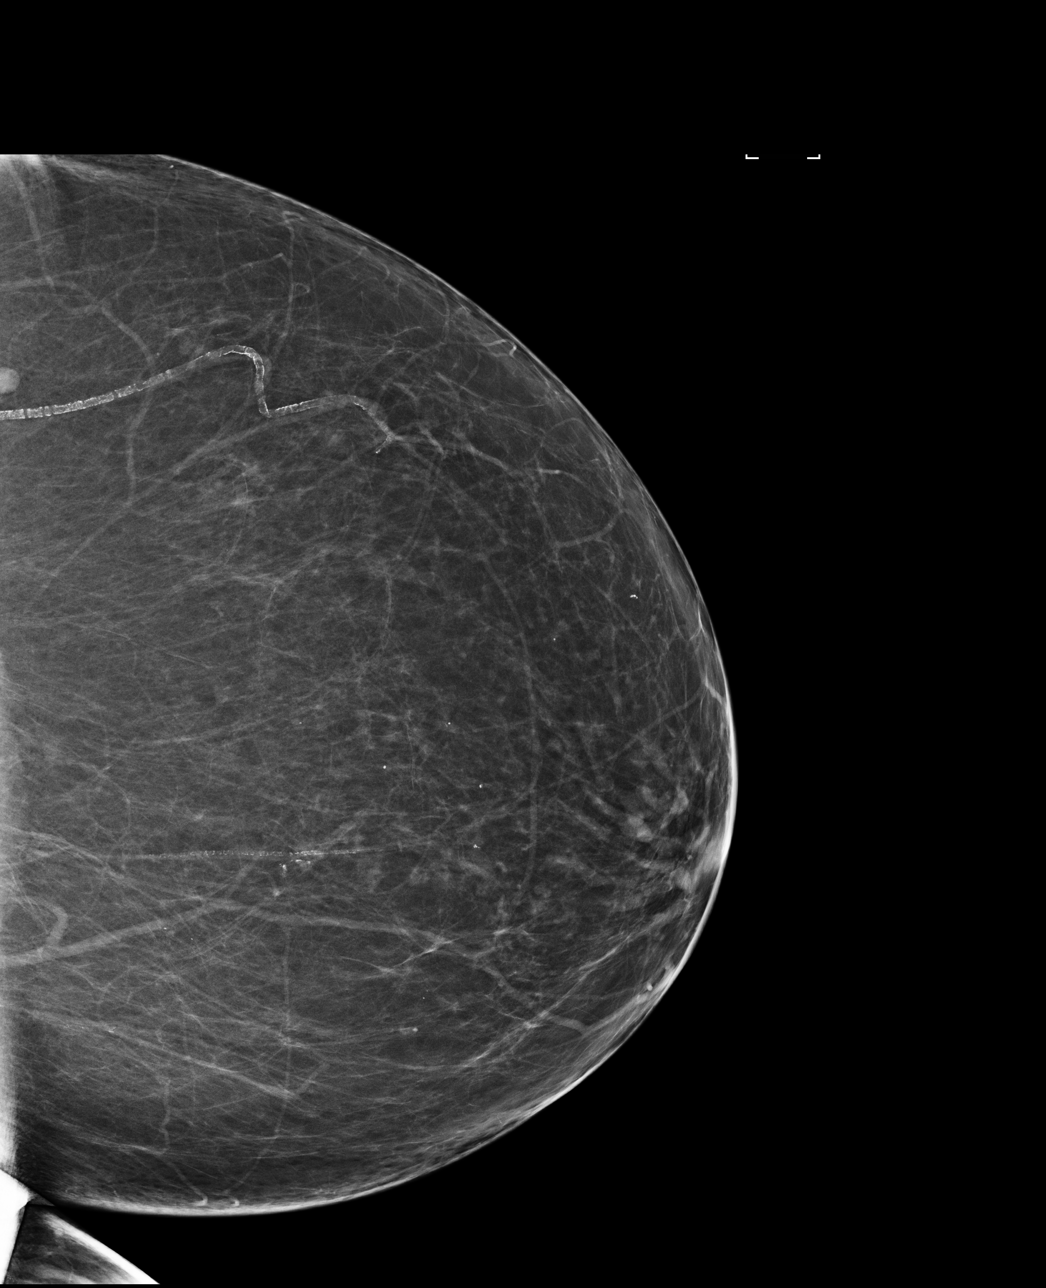

[R CC]
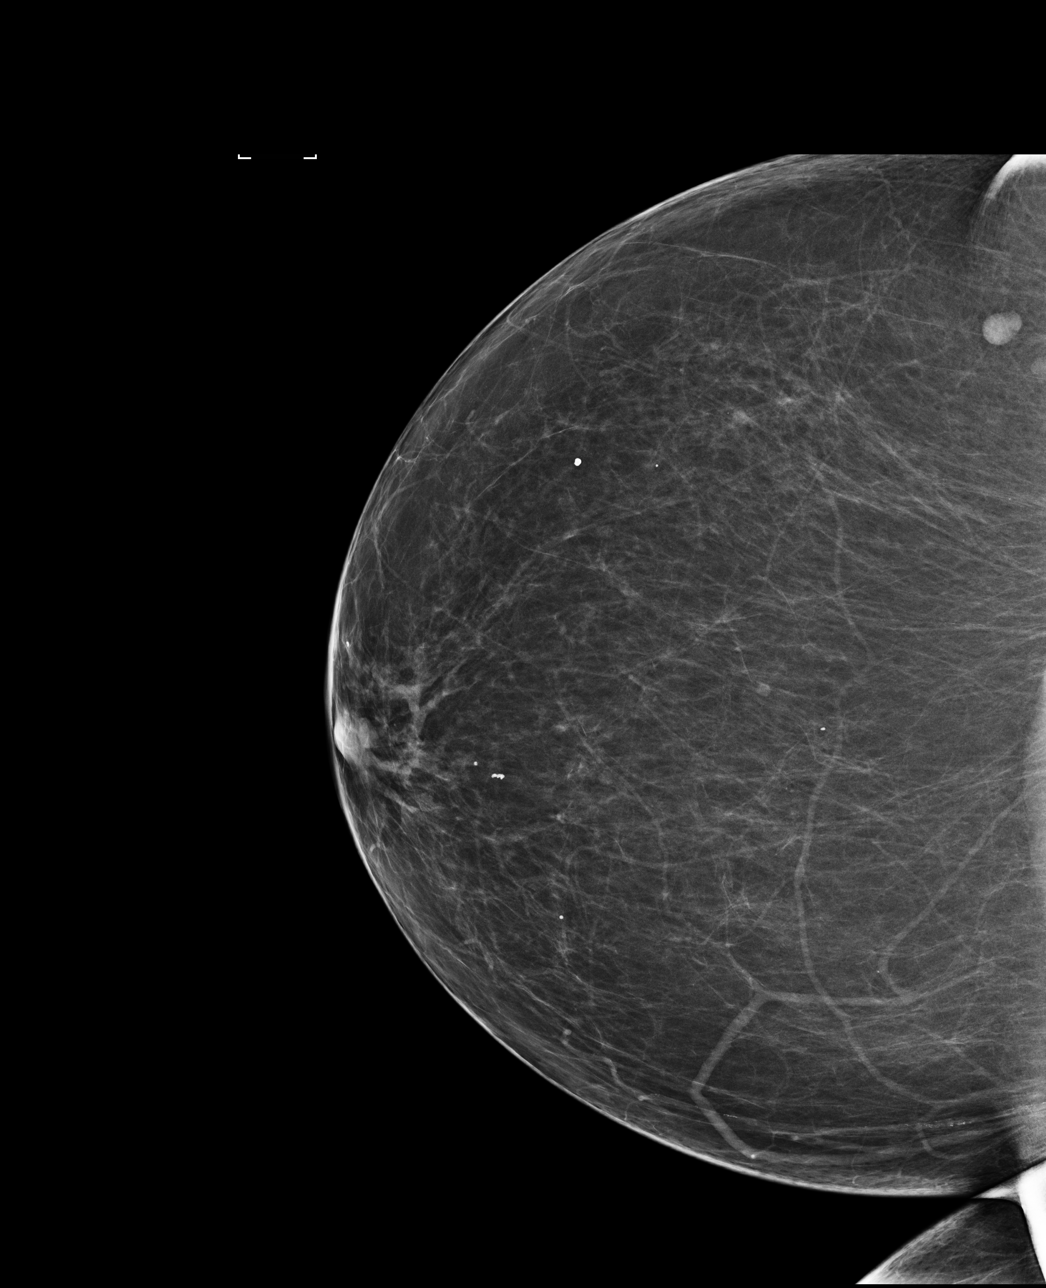

[4 of 4 positions shown; findings below may reference images not displayed]

FINDINGS: There are no findings suspicious for malignancy. Images were
processed with CAD.
IMPRESSION: No mammographic evidence of malignancy. A result letter of this
screening mammogram will be mailed directly to the patient.

RECOMMENDATION:
Screening mammogram in one year. (Code:[id number])

BI-RADS CATEGORY  1: Negative.

## 2018-10-06 ENCOUNTER — Ambulatory Visit: Payer: Medicare HMO

## 2018-10-17 ENCOUNTER — Other Ambulatory Visit: Payer: Self-pay

## 2018-10-17 ENCOUNTER — Ambulatory Visit (INDEPENDENT_AMBULATORY_CARE_PROVIDER_SITE_OTHER): Payer: Medicare HMO

## 2018-10-17 DIAGNOSIS — Z1211 Encounter for screening for malignant neoplasm of colon: Secondary | ICD-10-CM | POA: Diagnosis not present

## 2018-10-17 DIAGNOSIS — Z0000 Encounter for general adult medical examination without abnormal findings: Secondary | ICD-10-CM | POA: Diagnosis not present

## 2018-10-17 NOTE — Patient Instructions (Signed)
[name] , Thank you for taking time to come for your Medicare Wellness Visit. I appreciate your ongoing commitment to your health goals. Please review the following plan we discussed and let me know if I can assist you in the future.   Screening recommendations/referrals: Colonoscopy: Referral to GI placed today. Pt aware the office will call re: appt. Mammogram: Up to date, due 11/2018 Bone Density: Up to date, due 11/2018 Recommended yearly ophthalmology/optometry visit for glaucoma screening and checkup Recommended yearly dental visit for hygiene and checkup  Vaccinations: Influenza vaccine: Due fall 2020 Pneumococcal vaccine: Completed series Tdap vaccine: Pt declines today.  Shingles vaccine: Pt declines today.     Advanced directives: Please bring a copy of your POA (Power of Attorney) and/or Living Will to your next appointment.   Conditions/risks identified: Continue to try and walk 3 days a week for at least 30 minutes at a time.   Next appointment: 11/24/18 @ 3:00 PM with Fenton Malling.    Preventive Care 23 Years and Older, Female Preventive care refers to lifestyle choices and visits with your health care provider that can promote health and wellness. What does preventive care include?  A yearly physical exam. This is also called an annual well check.  Dental exams once or twice a year.  Routine eye exams. Ask your health care provider how often you should have your eyes checked.  Personal lifestyle choices, including:  Daily care of your teeth and gums.  Regular physical activity.  Eating a healthy diet.  Avoiding tobacco and drug use.  Limiting alcohol use.  Practicing safe sex.  Taking low-dose aspirin every day.  Taking vitamin and mineral supplements as recommended by your health care provider. What happens during an annual well check? The services and screenings done by your health care provider during your annual well check will depend  on your age, overall health, lifestyle risk factors, and family history of disease. Counseling  Your health care provider may ask you questions about your:  Alcohol use.  Tobacco use.  Drug use.  Emotional well-being.  Home and relationship well-being.  Sexual activity.  Eating habits.  History of falls.  Memory and ability to understand (cognition).  Work and work Statistician.  Reproductive health. Screening  You may have the following tests or measurements:  Height, weight, and BMI.  Blood pressure.  Lipid and cholesterol levels. These may be checked every 5 years, or more frequently if you are over 61 years old.  Skin check.  Lung cancer screening. You may have this screening every year starting at age 76 if you have a 30-pack-year history of smoking and currently smoke or have quit within the past 15 years.  Fecal occult blood test (FOBT) of the stool. You may have this test every year starting at age 18.  Flexible sigmoidoscopy or colonoscopy. You may have a sigmoidoscopy every 5 years or a colonoscopy every 10 years starting at age 40.  Hepatitis C blood test.  Hepatitis B blood test.  Sexually transmitted disease (STD) testing.  Diabetes screening. This is done by checking your blood sugar (glucose) after you have not eaten for a while (fasting). You may have this done every 1-3 years.  Bone density scan. This is done to screen for osteoporosis. You may have this done starting at age 3.  Mammogram. This may be done every 1-2 years. Talk to your health care provider about how often you should have regular mammograms. Talk with your health care  provider about your test results, treatment options, and if necessary, the need for more tests. Vaccines  Your health care provider may recommend certain vaccines, such as:  Influenza vaccine. This is recommended every year.  Tetanus, diphtheria, and acellular pertussis (Tdap, Td) vaccine. You may need a Td  booster every 10 years.  Zoster vaccine. You may need this after age 78.  Pneumococcal 13-valent conjugate (PCV13) vaccine. One dose is recommended after age 47.  Pneumococcal polysaccharide (PPSV23) vaccine. One dose is recommended after age 20. Talk to your health care provider about which screenings and vaccines you need and how often you need them. This information is not intended to replace advice given to you by your health care provider. Make sure you discuss any questions you have with your health care provider. Document Released: 05/02/2015 Document Revised: 12/24/2015 Document Reviewed: 02/04/2015 Elsevier Interactive Patient Education  2017 [place] Prevention in the Home Falls can cause injuries. They can happen to people of all ages. There are many things you can do to make your home safe and to help prevent falls. What can I do on the outside of my home?  Regularly fix the edges of walkways and driveways and fix any cracks.  Remove anything that might make you trip as you walk through a door, such as a raised step or threshold.  Trim any bushes or trees on the path to your home.  Use bright outdoor lighting.  Clear any walking paths of anything that might make someone trip, such as rocks or tools.  Regularly check to see if handrails are loose or broken. Make sure that both sides of any steps have handrails.  Any raised decks and porches should have guardrails on the edges.  Have any leaves, snow, or ice cleared regularly.  Use sand or salt on walking paths during winter.  Clean up any spills in your garage right away. This includes oil or grease spills. What can I do in the bathroom?  Use night lights.  Install grab bars by the toilet and in the tub and shower. Do not use towel bars as grab bars.  Use non-skid mats or decals in the tub or shower.  If you need to sit down in the shower, use a plastic, non-slip stool.  Keep the floor dry. Clean up  any water that spills on the floor as soon as it happens.  Remove soap buildup in the tub or shower regularly.  Attach bath mats securely with double-sided non-slip rug tape.  Do not have throw rugs and other things on the floor that can make you trip. What can I do in the bedroom?  Use night lights.  Make sure that you have a light by your bed that is easy to reach.  Do not use any sheets or blankets that are too big for your bed. They should not hang down onto the floor.  Have a firm chair that has side arms. You can use this for support while you get dressed.  Do not have throw rugs and other things on the floor that can make you trip. What can I do in the kitchen?  Clean up any spills right away.  Avoid walking on wet floors.  Keep items that you use a lot in easy-to-reach places.  If you need to reach something above you, use a strong step stool that has a grab bar.  Keep electrical cords out of the way.  Do not use floor polish  or wax that makes floors slippery. If you must use wax, use non-skid floor wax.  Do not have throw rugs and other things on the floor that can make you trip. What can I do with my stairs?  Do not leave any items on the stairs.  Make sure that there are handrails on both sides of the stairs and use them. Fix handrails that are broken or loose. Make sure that handrails are as long as the stairways.  Check any carpeting to make sure that it is firmly attached to the stairs. Fix any carpet that is loose or worn.  Avoid having throw rugs at the top or bottom of the stairs. If you do have throw rugs, attach them to the floor with carpet tape.  Make sure that you have a light switch at the top of the stairs and the bottom of the stairs. If you do not have them, ask someone to add them for you. What else can I do to help prevent falls?  Wear shoes that:  Do not have high heels.  Have rubber bottoms.  Are comfortable and fit you well.  Are  closed at the toe. Do not wear sandals.  If you use a stepladder:  Make sure that it is fully opened. Do not climb a closed stepladder.  Make sure that both sides of the stepladder are locked into place.  Ask someone to hold it for you, if possible.  Clearly mark and make sure that you can see:  Any grab bars or handrails.  First and last steps.  Where the edge of each step is.  Use tools that help you move around (mobility aids) if they are needed. These include:  Canes.  Walkers.  Scooters.  Crutches.  Turn on the lights when you go into a dark area. Replace any light bulbs as soon as they burn out.  Set up your furniture so you have a clear path. Avoid moving your furniture around.  If any of your floors are uneven, fix them.  If there are any pets around you, be aware of where they are.  Review your medicines with your doctor. Some medicines can make you feel dizzy. This can increase your chance of falling. Ask your doctor what other things that you can do to help prevent falls. This information is not intended to replace advice given to you by your health care provider. Make sure you discuss any questions you have with your health care provider. Document Released: 01/30/2009 Document Revised: 09/11/2015 Document Reviewed: 05/10/2014 Elsevier Interactive Patient Education  2017 [employer].

## 2018-10-17 NOTE — Progress Notes (Signed)
Subjective:   Marissa Hahn is a 73 y.o. female who presents for Medicare Annual (Subsequent) preventive examination.    This visit is being conducted through telemedicine due to the COVID-19 pandemic. This patient has given me verbal consent via doximity to conduct this visit, patient states they are participating from their home address. Some vital signs may be absent or patient reported.    Patient identification: identified by name, DOB, and current address  Review of Systems:  N/A  Cardiac Risk Factors include: advanced age (>1455men, 11>65 women);hypertension     Objective:     Vitals: There were no vitals taken for this visit.  There is no height or weight on file to calculate BMI. Unable to obtain vitals due to visit being conducted via telephonically.   Advanced Directives 10/17/2018 10/04/2017 10/07/2016 10/05/2016 03/25/2015 12/31/2014  Does Patient Have a Medical Advance Directive? Yes No No No Yes No  Type of Estate agentAdvance Directive Healthcare Power of [place];Living will - - - Public affairs consultantHealthcare Power of [place];Living will -  Does patient want to make changes to medical advance directive? - No - Patient declined - - - -  Copy of Healthcare Power of Attorney in Chart? No - copy requested - - - - -  Would patient like information on creating a medical advance directive? - No - Patient declined - - - -    Tobacco Social History   Tobacco Use  Smoking Status Never Smoker  Smokeless Tobacco Never Used     Counseling given: Not Answered   Clinical Intake:  Pre-visit preparation completed: Yes  Pain : No/denies pain Pain Score: 0-No pain     Nutritional Risks: None Diabetes: No  How often do you need to have someone help you when you read instructions, pamphlets, or other written materials from your doctor or pharmacy?: 1 - Never  Interpreter Needed?: No  Information entered by :: [facility] CenterMmarkoski, LPN  History reviewed. No pertinent past medical history. Past Surgical  History:  Procedure Laterality Date   ABDOMINAL HYSTERECTOMY  1990   Abdominal   CARPAL TUNNEL RELEASE Left 01/2010   Family History  Problem Relation Age of Onset   Hypertension Mother    Lung cancer Father    Healthy Other    Hypertension Sister    Hypertension Sister    Hypertension Sister    Breast cancer Neg Hx    Social History   Socioeconomic History   Marital status: Divorced    Spouse name: Not on file   Number of children: 2   Years of education: H/S   Highest education level: 12th grade  Occupational History   Occupation: Retired  Ecologistocial Needs   Financial resource strain: Not hard at [employer]   Food insecurity    Worry: Never true    Inability: Never true   Transportation needs    Medical: No    Non-medical: No  Tobacco Use   Smoking status: Never Smoker   Smokeless tobacco: Never Used  Substance and Sexual Activity   Alcohol use: Not Currently   Drug use: No   Sexual activity: Not on file  Lifestyle   Physical activity    Days per week: 0 days    Minutes per session: 0 min   Stress: Only a little  Relationships   Social connections    Talks on phone: Patient refused    Gets together: Patient refused    Attends religious service: Patient refused    Active member of  club or organization: Patient refused    Attends meetings of clubs or organizations: Patient refused    Relationship status: Patient refused  Other Topics Concern   Not on file  Social History Narrative   Not on file    Outpatient Encounter Medications as of 10/17/2018  Medication Sig   amLODipine (NORVASC) 5 MG tablet TAKE 1 TABLET EVERY DAY   bisoprolol-hydrochlorothiazide (ZIAC) 10-6.25 MG tablet TAKE 1 TABLET EVERY DAY   fluticasone (FLONASE) 50 MCG/ACT nasal spray USE 2 SPRAYS IN EACH NOSTRIL EVERY DAY (Patient taking differently: Place 2 sprays into both nostrils as needed. )   loratadine (CLARITIN) 10 MG tablet Take 10 mg by mouth daily as needed.      simvastatin (ZOCOR) 10 MG tablet TAKE 1 TABLET (10 MG TOTAL) BY MOUTH DAILY AT 6 PM.   Vitamin D, Cholecalciferol, 1000 units CAPS Take 1 capsule by mouth daily.   zolpidem (AMBIEN) 5 MG tablet Take 1 tablet (5 mg total) by mouth at bedtime as needed.   meloxicam (MOBIC) 7.5 MG tablet Take 1 tablet (7.5 mg total) by mouth daily. (Patient not taking: Reported on 10/17/2018)   pseudoephedrine-acetaminophen (TYLENOL SINUS) 30-500 MG TABS tablet Take 1 tablet by mouth every 4 (four) hours as needed.   No facility-administered encounter medications on file as of 10/17/2018.     Activities of Daily Living In your present state of health, do you have any difficulty performing the following activities: 10/17/2018  Hearing? N  Vision? N  Comment Wears eye glasses or contacts daily.  Difficulty concentrating or making decisions? N  Walking or climbing stairs? N  Dressing or bathing? N  Doing errands, shopping? N  Preparing Food and eating ? N  Using the Toilet? N  In the past six months, have you accidently leaked urine? N  Do you have problems with loss of bowel control? N  Managing your Medications? N  Managing your Finances? N  Housekeeping or managing your Housekeeping? N  Some recent data might be hidden    Patient Care Team: Reine JustBurnette, Jennifer M, PA-C as PCP - General (Family Medicine)    Assessment:   This is a routine wellness examination for Marissa Hahn.  Exercise Activities and Dietary recommendations Current Exercise Habits: The patient does not participate in regular exercise at present, Exercise limited by: None identified  Goals     Exercise 3x per week (30 min per time)     Recommend to start walking 3 days a week for 30 minutes.        Fall Risk: Fall Risk  10/17/2018 10/04/2017 06/02/2017 10/05/2016 04/27/2016  Falls in the past year? 0 No No No No    FALL RISK PREVENTION PERTAINING TO THE HOME:  Any stairs in or around the home? Yes  If so, are there any without  handrails? No   Home free of loose throw rugs in walkways, pet beds, electrical cords, etc? Yes  Adequate lighting in your home to reduce risk of falls? Yes   ASSISTIVE DEVICES UTILIZED TO PREVENT FALLS:  Life alert? No  Use of a cane, walker or w/c? No  Grab bars in the bathroom? No  Shower chair or bench in shower? No  Elevated toilet seat or a handicapped toilet? No    TIMED UP AND GO:  Was the test performed? No .    Depression Screen PHQ 2/9 Scores 10/17/2018 10/04/2017 04/06/2017 10/05/2016  PHQ - 2 Score 0 1 0 0  PHQ- 9 Score - -  4 4     Cognitive Function: Declined today.      6CIT Screen 10/05/2016  What Year? 0 points  What month? 0 points  What time? 0 points  Count back from 20 0 points  Months in reverse 0 points  Repeat phrase 0 points  Total Score 0    Immunization History  Administered Date(s) Administered   Pneumococcal Conjugate-13 08/24/2013   Pneumococcal Polysaccharide-23 03/25/2015    Qualifies for Shingles Vaccine? Yes . Due for Shingrix. Education has been provided regarding the importance of this vaccine. Pt has been advised to call insurance company to determine out of pocket expense. Advised may also receive vaccine at local pharmacy or Health Dept. Verbalized acceptance and understanding.  Tdap: Although this vaccine is not a covered service during a Wellness Exam, does the patient still wish to receive this vaccine today?  No .  Education has been provided regarding the importance of this vaccine. Advised may receive this vaccine at local pharmacy or Health Dept. Aware to provide a copy of the vaccination record if obtained from local pharmacy or Health Dept. Verbalized acceptance and understanding.  Flu Vaccine: Due fall 2020  Pneumococcal Vaccine: Completed series  Screening Tests Health Maintenance  Topic Date Due   COLONOSCOPY  04/19/2026 (Originally 08/01/2016)   TETANUS/TDAP  04/19/2026 (Originally 02/25/1965)   INFLUENZA  VACCINE  11/18/2018   MAMMOGRAM  12/09/2018   DEXA SCAN  12/09/2018   Hepatitis C Screening  Completed   PNA vac Low Risk Adult  Completed    Cancer Screenings:  Colorectal Screening: Completed 08/01/16. Repeat every 10 years. Referral to GI placed today. Pt aware the office will call re: appt.  Mammogram: Completed 12/10/16.  Bone Density: Completed 12/08/16. Results reflect OSTEOPOROSIS. Repeat every 2 years.   Lung Cancer Screening: (Low Dose CT Chest recommended if Age 72-80 years, 30 pack-year currently smoking OR have quit w/in 15years.) does not qualify.   Additional Screening:  Hepatitis C Screening: Up to date  Vision Screening: Recommended annual ophthalmology exams for early detection of glaucoma and other disorders of the eye.  Dental Screening: Recommended annual dental exams for proper oral hygiene  Community Resource Referral:  CRR required this visit?  No       Plan:  I have personally reviewed and addressed the Medicare Annual Wellness questionnaire and have noted the following in the patients chart:  A. Medical and social history B. Use of alcohol, tobacco or illicit drugs  C. Current medications and supplements D. Functional ability and status E.  Nutritional status F.  Physical activity G. Advance directives H. List of other physicians I.  Hospitalizations, surgeries, and ER visits in previous 12 months J.  [place] such as hearing and vision if needed, cognitive and depression L. Referrals and appointments   In addition, I have reviewed and discussed with patient certain preventive protocols, quality metrics, and best practice recommendations. A written personalized care plan for preventive services as well as general preventive health recommendations were provided to patient. Nurse Health Advisor  Signed,    Eugina Row [place], [place]  [id number] Nurse Health Advisor   Nurse Notes: Pt declined a future tetanus order.

## 2018-11-15 ENCOUNTER — Encounter: Payer: Self-pay | Admitting: *Deleted

## 2018-11-24 ENCOUNTER — Ambulatory Visit (INDEPENDENT_AMBULATORY_CARE_PROVIDER_SITE_OTHER): Payer: Medicare HMO | Admitting: Physician Assistant

## 2018-11-24 ENCOUNTER — Encounter: Payer: Self-pay | Admitting: Physician Assistant

## 2018-11-24 ENCOUNTER — Other Ambulatory Visit: Payer: Self-pay

## 2018-11-24 VITALS — BP 166/80 | HR 65 | Temp 98.2°F | Resp 16 | Ht 62.0 in | Wt 199.6 lb

## 2018-11-24 DIAGNOSIS — E559 Vitamin D deficiency, unspecified: Secondary | ICD-10-CM

## 2018-11-24 DIAGNOSIS — Z0000 Encounter for general adult medical examination without abnormal findings: Secondary | ICD-10-CM

## 2018-11-24 DIAGNOSIS — I10 Essential (primary) hypertension: Secondary | ICD-10-CM | POA: Diagnosis not present

## 2018-11-24 DIAGNOSIS — E782 Mixed hyperlipidemia: Secondary | ICD-10-CM | POA: Diagnosis not present

## 2018-11-24 DIAGNOSIS — Z1239 Encounter for other screening for malignant neoplasm of breast: Secondary | ICD-10-CM

## 2018-11-24 DIAGNOSIS — R739 Hyperglycemia, unspecified: Secondary | ICD-10-CM | POA: Diagnosis not present

## 2018-11-24 NOTE — Progress Notes (Signed)
[name], Female    DOB: 07/13/1945, 73 y.o.   MRN: [id number] Visit Date: 11/24/2018  Today's Provider: Margaretann LovelessJennifer M Amyra Vantuyl, PA-C   Chief Complaint  Patient presents with  . Annual Exam   Subjective:    Patient had AWV with [facility] on 10/17/2018   Complete Physical Windi Liam GrahamR Corbitt is a 73 y.o. female. She feels well. She reports exercising some. She reports she is sleeping fairly well. -----------------------------------------------------------   Review of Systems  Constitutional: Negative.   HENT: Negative.   Eyes: Negative.   Respiratory: Negative.   Cardiovascular: Negative.   Gastrointestinal: Negative.   Endocrine: Negative.   Genitourinary: Negative.   Musculoskeletal: Negative.   Skin: Negative.   Allergic/Immunologic: Negative.   Neurological: Negative.   Hematological: Negative.   Psychiatric/Behavioral: Negative.     Social History   Socioeconomic History  . Marital status: Divorced    Spouse name: Not on file  . Number of children: 2  . Years of education: H/S  . Highest education level: 12th grade  Occupational History  . Occupation: Retired  Engineer, productionocial Needs  . Financial resource strain: Not hard at all  . Food insecurity    Worry: Never true    Inability: Never true  . Transportation needs    Medical: No    Non-medical: No  Tobacco Use  . Smoking status: Never Smoker  . Smokeless tobacco: Never Used  Substance and Sexual Activity  . Alcohol use: Not Currently  . Drug use: No  . Sexual activity: Not on file  Lifestyle  . Physical activity    Days per week: 0 days    Minutes per session: 0 min  . Stress: Only a little  Relationships  . Social Musicianconnections    Talks on phone: Patient refused    Gets together: Patient refused    Attends religious service: Patient refused    Active member of club or organization: Patient refused    Attends meetings of clubs or organizations: Patient refused    Relationship status:  Patient refused  . Intimate partner violence    Fear of current or ex partner: Patient refused    Emotionally abused: Patient refused    Physically abused: Patient refused    Forced sexual activity: Patient refused  Other Topics Concern  . Not on file  Social History Narrative  . Not on file    History reviewed. No pertinent past medical history.   Patient Active Problem List   Diagnosis Date Noted  . Osteoporosis 12/08/2016  . Benign paroxysmal positional nystagmus 12/31/2014  . Carpal tunnel syndrome 12/31/2014  . Blood glucose elevated 12/31/2014  . Avitaminosis D 10/09/2009  . Allergic rhinitis 07/29/2009  . 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 BP (high blood pressure) 10/09/2001  . Cannot sleep 10/09/2001    Past Surgical History:  Procedure Laterality Date  . ABDOMINAL HYSTERECTOMY  1990   Abdominal  . CARPAL TUNNEL RELEASE Left 01/2010    Her family history includes Healthy in an other family member; Hypertension in her mother, sister, sister, and sister; Lung cancer in her father. There is no history of Breast cancer.   Current Outpatient Medications:  .  amLODipine (NORVASC) 5 MG tablet, TAKE 1 TABLET EVERY DAY, Disp: 90 tablet, Rfl: 3 .  bisoprolol-hydrochlorothiazide (ZIAC) 10-6.25 MG tablet, TAKE 1 TABLET EVERY DAY, Disp: 90 tablet, Rfl: 3 .  fluticasone (FLONASE) 50 MCG/ACT nasal spray, USE 2 SPRAYS  IN EACH NOSTRIL EVERY DAY (Patient taking differently: Place 2 sprays into both nostrils as needed. ), Disp: 48 g, Rfl: 3 .  loratadine (CLARITIN) 10 MG tablet, Take 10 mg by mouth daily as needed. , Disp: , Rfl:  .  pseudoephedrine-acetaminophen (TYLENOL SINUS) 30-500 MG TABS tablet, Take 1 tablet by mouth every 4 (four) hours as needed., Disp: , Rfl:  .  simvastatin (ZOCOR) 10 MG tablet, TAKE 1 TABLET (10 MG TOTAL) BY MOUTH DAILY AT 6 PM., Disp: 90 tablet, Rfl: 3 .  Vitamin D, Cholecalciferol, 1000 units CAPS, Take 1 capsule by mouth  daily., Disp: , Rfl:  .  zolpidem (AMBIEN) 5 MG tablet, Take 1 tablet (5 mg total) by mouth at bedtime as needed., Disp: 90 tablet, Rfl: 1 .  meloxicam (MOBIC) 7.5 MG tablet, Take 1 tablet (7.5 mg total) by mouth daily. (Patient not taking: Reported on 10/17/2018), Disp: 90 tablet, Rfl: 3  Patient Care Team: Margaretann LovelessBurnette, Rilen Shukla M, PA-C as PCP - General (Family Medicine)     Objective:    Vitals: Pulse 72   Temp 98.2 F (36.8 C) (Oral)   Resp 16   Ht 5\' 2"  (1.575 m)   Wt 199 lb 9.6 oz (90.5 kg)   SpO2 98%   BMI 36.51 kg/m   Physical Exam Vitals signs reviewed.  Constitutional:      General: She is not in acute distress.    Appearance: Normal appearance. She is well-developed. She is obese. She is not ill-appearing or diaphoretic.  HENT:     Head: Normocephalic and atraumatic.     Right Ear: Tympanic membrane, ear canal and external ear normal.     Left Ear: Tympanic membrane, ear canal and external ear normal.     Nose: Nose normal.     Mouth/Throat:     Mouth: Mucous membranes are moist.     Pharynx: Oropharynx is clear. No oropharyngeal exudate.  Eyes:     General: No scleral icterus.       Right eye: No discharge.        Left eye: No discharge.     Extraocular Movements: Extraocular movements intact.     Conjunctiva/sclera: Conjunctivae normal.     Pupils: Pupils are equal, round, and reactive to light.  Neck:     Musculoskeletal: Normal range of motion and neck supple.     Thyroid: No thyromegaly.     Vascular: No carotid bruit or JVD.     Trachea: No tracheal deviation.  Cardiovascular:     Rate and Rhythm: Normal rate and regular rhythm.     Pulses: Normal pulses.     Heart sounds: Normal heart sounds. No murmur. No friction rub. No gallop.   Pulmonary:     Effort: Pulmonary effort is normal. No respiratory distress.     Breath sounds: Normal breath sounds. No wheezing or rales.  Chest:     Chest wall: No tenderness.  Abdominal:     General: Bowel sounds are  normal. There is no distension.     Palpations: Abdomen is soft. There is no mass.     Tenderness: There is no abdominal tenderness. There is no guarding or rebound.  Musculoskeletal: Normal range of motion.        General: No tenderness.     Right lower leg: No edema.     Left lower leg: No edema.  Lymphadenopathy:     Cervical: No cervical adenopathy.  Skin:    General: Skin  is warm and dry.     Capillary Refill: Capillary refill takes less than 2 seconds.     Findings: No rash.  Neurological:     General: No focal deficit present.     Mental Status: She is alert and oriented to person, place, and time. Mental status is at baseline.  Psychiatric:        Mood and Affect: Mood normal.        Behavior: Behavior normal.        Thought Content: Thought content normal.        Judgment: Judgment normal.     Activities of Daily Living In your present state of health, do you have any difficulty performing the following activities: 10/17/2018  Hearing? N  Vision? N  Comment Wears eye glasses or contacts daily.  Difficulty concentrating or making decisions? N  Walking or climbing stairs? N  Dressing or bathing? N  Doing errands, shopping? N  Preparing Food and eating ? N  Using the Toilet? N  In the past six months, have you accidently leaked urine? N  Do you have problems with loss of bowel control? N  Managing your Medications? N  Managing your Finances? N  Housekeeping or managing your Housekeeping? N  Some recent data might be hidden    Fall Risk Assessment Fall Risk  10/17/2018 10/04/2017 06/02/2017 10/05/2016 04/27/2016  Falls in the past year? 0 No No No No     Depression Screen PHQ 2/9 Scores 10/17/2018 10/04/2017 04/06/2017 10/05/2016  PHQ - 2 Score 0 1 0 0  PHQ- 9 Score - - 4 4   Cognitive Function: Declined 10/17/18 with [facility]. 6CIT Screen 10/05/2016  What Year? 0 points  What month? 0 points  What time? 0 points  Count back from 20 0 points  Months in reverse 0 points   Repeat phrase 0 points  Total Score 0       Assessment & Plan:    Annual Physical Reviewed patient's Family Medical History Reviewed and updated list of patient's medical providers Assessment of cognitive impairment was done Assessed patient's functional ability Established a written schedule for health screening services Health Risk Assessent Completed and Reviewed  Exercise Activities and Dietary recommendations Goals    . Exercise 3x per week (30 min per time)     Recommend to start walking 3 days a week for 30 minutes.        Immunization History  Administered Date(s) Administered  . Pneumococcal Conjugate-13 08/24/2013  . Pneumococcal Polysaccharide-23 03/25/2015    Health Maintenance  Topic Date Due  . INFLUENZA VACCINE  11/18/2018  . COLONOSCOPY  04/19/2026 (Originally 08/01/2016)  . TETANUS/TDAP  04/19/2026 (Originally 02/25/1967)  . MAMMOGRAM  12/09/2018  . DEXA SCAN  12/09/2018  . Hepatitis C Screening  Completed  . PNA vac Low Risk Adult  Completed     Discussed health benefits of physical activity, and encouraged her to engage in regular exercise appropriate for her age and condition.   1. Annual physical exam Normal physical exam today. Will check labs as below and f/u pending lab results. If labs are stable and WNL she will not need to have these rechecked for one year at her next annual physical exam. She is to call the office in the meantime if she has any acute issue, questions or concerns. - CBC with Differential/Platelet - Comprehensive metabolic panel - Hemoglobin A1c - Lipid panel - TSH  2. Breast cancer screening Breast exam today was  normal. There is no family history of breast cancer. She does perform regular self breast exams. Mammogram was ordered as below. Information for [facility] clinic was given to patient so she may schedule her mammogram at her convenience. - MM 3D SCREEN BREAST BILATERAL; Future  3. Essential hypertension  Stable Continue amlodipine 5mg , Ziac 10-6.25mg . Will check labs as below and f/u pending results. - CBC with Differential/Platelet - Comprehensive metabolic panel - Hemoglobin A1c - Lipid panel - TSH  4. Avitaminosis D H/O this and post menopausal. Will check labs as below and f/u pending results. - CBC with Differential/Platelet - Comprehensive metabolic panel  5. Blood glucose elevated Diet controlled. Will check labs as below and f/u pending results. - CBC with Differential/Platelet - Comprehensive metabolic panel - Hemoglobin A1c - Lipid panel  6. Combined fat and carbohydrate induced hyperlipemia Stable. Continue Simvastatin 10mg . Will check labs as below and f/u pending results. - CBC with Differential/Platelet - Comprehensive metabolic panel - Hemoglobin A1c - Lipid panel  ------------------------------------------------------------------------------------------------------------    Mar Daring, PA-C  [facility]

## 2018-11-24 NOTE — Patient Instructions (Signed)
Health Maintenance After Age 71 After age 71, you are at a higher risk for certain long-term diseases and infections as well as injuries from falls. Falls are a major cause of broken bones and head injuries in people who are older than age 71. Getting regular preventive care can help to keep you healthy and well. Preventive care includes getting regular testing and making lifestyle changes as recommended by your health care provider. Talk with your health care provider about:  Which screenings and tests you should have. A screening is a test that checks for a disease when you have no symptoms.  A diet and exercise plan that is right for you. What should I know about screenings and tests to prevent falls? Screening and testing are the best ways to find a health problem early. Early diagnosis and treatment give you the best chance of managing medical conditions that are common after age 71. Certain conditions and lifestyle choices may make you more likely to have a fall. Your health care provider may recommend:  Regular vision checks. Poor vision and conditions such as cataracts can make you more likely to have a fall. If you wear glasses, make sure to get your prescription updated if your vision changes.  Medicine review. Work with your health care provider to regularly review all of the medicines you are taking, including over-the-counter medicines. Ask your health care provider about any side effects that may make you more likely to have a fall. Tell your health care provider if any medicines that you take make you feel dizzy or sleepy.  Osteoporosis screening. Osteoporosis is a condition that causes the bones to get weaker. This can make the bones weak and cause them to break more easily.  Blood pressure screening. Blood pressure changes and medicines to control blood pressure can make you feel dizzy.  Strength and balance checks. Your health care provider may recommend certain tests to check your  strength and balance while standing, walking, or changing positions.  Foot health exam. Foot pain and numbness, as well as not wearing proper footwear, can make you more likely to have a fall.  Depression screening. You may be more likely to have a fall if you have a fear of falling, feel emotionally low, or feel unable to do activities that you used to do.  Alcohol use screening. Using too much alcohol can affect your balance and may make you more likely to have a fall. What actions can I take to lower my risk of falls? General instructions  Talk with your health care provider about your risks for falling. Tell your health care provider if: ? You fall. Be sure to tell your health care provider about all falls, even ones that seem minor. ? You feel dizzy, sleepy, or off-balance.  Take over-the-counter and prescription medicines only as told by your health care provider. These include any supplements.  Eat a healthy diet and maintain a healthy weight. A healthy diet includes low-fat dairy products, low-fat (lean) meats, and fiber from whole grains, beans, and lots of fruits and vegetables. Home safety  Remove any tripping hazards, such as rugs, cords, and clutter.  Install safety equipment such as grab bars in bathrooms and safety rails on stairs.  Keep rooms and walkways well-lit. Activity   Follow a regular exercise program to stay fit. This will help you maintain your balance. Ask your health care provider what types of exercise are appropriate for you.  If you need a cane or   walker, use it as recommended by your health care provider.  Wear supportive shoes that have nonskid soles. Lifestyle  Do not drink alcohol if your health care provider tells you not to drink.  If you drink alcohol, limit how much you have: ? 0-1 drink a day for women. ? 0-2 drinks a day for men.  Be aware of how much alcohol is in your drink. In the [place] one drink equals one typical bottle of beer (12  oz), one-half glass of wine (5 oz), or one shot of hard liquor (1 oz).  Do not use any products that contain nicotine or tobacco, such as cigarettes and e-cigarettes. If you need help quitting, ask your health care provider. Summary  Having a healthy lifestyle and getting preventive care can help to protect your health and wellness after age 71.  Screening and testing are the best way to find a health problem early and help you avoid having a fall. Early diagnosis and treatment give you the best chance for managing medical conditions that are more common for people who are older than age 71.  Falls are a major cause of broken bones and head injuries in people who are older than age 71. Take precautions to prevent a fall at home.  Work with your health care provider to learn what changes you can make to improve your health and wellness and to prevent falls. This information is not intended to replace advice given to you by your health care provider. Make sure you discuss any questions you have with your health care provider. Document Released: 02/16/2017 Document Revised: 07/27/2018 Document Reviewed: 02/16/2017 Elsevier Patient Education  2020 [employer]  

## 2018-11-27 DIAGNOSIS — I10 Essential (primary) hypertension: Secondary | ICD-10-CM | POA: Diagnosis not present

## 2018-11-27 DIAGNOSIS — E782 Mixed hyperlipidemia: Secondary | ICD-10-CM | POA: Diagnosis not present

## 2018-11-27 DIAGNOSIS — Z0000 Encounter for general adult medical examination without abnormal findings: Secondary | ICD-10-CM | POA: Diagnosis not present

## 2018-11-27 DIAGNOSIS — R739 Hyperglycemia, unspecified: Secondary | ICD-10-CM | POA: Diagnosis not present

## 2018-11-27 DIAGNOSIS — E559 Vitamin D deficiency, unspecified: Secondary | ICD-10-CM | POA: Diagnosis not present

## 2018-11-28 ENCOUNTER — Telehealth: Payer: Self-pay

## 2018-11-28 DIAGNOSIS — D72828 Other elevated white blood cell count: Secondary | ICD-10-CM

## 2018-11-28 LAB — COMPREHENSIVE METABOLIC PANEL
ALT: 13 IU/L (ref 0–32)
AST: 18 IU/L (ref 0–40)
Albumin/Globulin Ratio: 1.3 (ref 1.2–2.2)
Albumin: 4.1 g/dL (ref 3.8–4.8)
Alkaline Phosphatase: 82 IU/L (ref 39–117)
BUN/Creatinine Ratio: 12 (ref 12–28)
BUN: 11 mg/dL (ref 8–27)
Bilirubin Total: 0.3 mg/dL (ref 0.0–1.2)
CO2: 20 mmol/L (ref 20–29)
Calcium: 9.4 mg/dL (ref 8.7–10.3)
Chloride: 102 mmol/L (ref 96–106)
Creatinine, Ser: 0.9 mg/dL (ref 0.57–1.00)
GFR calc Af Amer: 75 mL/min/{1.73_m2} (ref >59)
GFR calc non Af Amer: 65 mL/min/{1.73_m2} (ref >59)
Globulin, Total: 3.1 g/dL (ref 1.5–4.5)
Glucose: 115 mg/dL — ABNORMAL HIGH (ref 65–99)
Potassium: 3.8 mmol/L (ref 3.5–5.2)
Sodium: 140 mmol/L (ref 134–144)
Total Protein: 7.2 g/dL (ref 6.0–8.5)

## 2018-11-28 LAB — CBC WITH DIFFERENTIAL/PLATELET
Basophils Absolute: 0 10*3/uL (ref 0.0–0.2)
Basos: 0 %
EOS (ABSOLUTE): 0.4 10*3/uL (ref 0.0–0.4)
Eos: 3 %
Hematocrit: 38.8 % (ref 34.0–46.6)
Hemoglobin: 12.4 g/dL (ref 11.1–15.9)
Immature Grans (Abs): 0 10*3/uL (ref 0.0–0.1)
Immature Granulocytes: 0 %
Lymphocytes Absolute: 4.4 10*3/uL — ABNORMAL HIGH (ref 0.7–3.1)
Lymphs: 40 %
MCH: 27.2 pg (ref 26.6–33.0)
MCHC: 32 g/dL (ref 31.5–35.7)
MCV: 85 fL (ref 79–97)
Monocytes Absolute: 0.6 10*3/uL (ref 0.1–0.9)
Monocytes: 6 %
Neutrophils Absolute: 5.6 10*3/uL (ref 1.4–7.0)
Neutrophils: 51 %
Platelets: 376 10*3/uL (ref 150–450)
RBC: 4.56 x10E6/uL (ref 3.77–5.28)
RDW: 13 % (ref 11.7–15.4)
WBC: 11 10*3/uL — ABNORMAL HIGH (ref 3.4–10.8)

## 2018-11-28 LAB — LIPID PANEL
Chol/HDL Ratio: 3.7 ratio (ref 0.0–4.4)
Cholesterol, Total: 166 mg/dL (ref 100–199)
HDL: 45 mg/dL (ref >39)
LDL Calculated: 102 mg/dL — ABNORMAL HIGH (ref 0–99)
Triglycerides: 94 mg/dL (ref 0–149)
VLDL Cholesterol Cal: 19 mg/dL (ref 5–40)

## 2018-11-28 LAB — HEMOGLOBIN A1C
Est. average glucose Bld gHb Est-mCnc: 128 mg/dL
Hgb A1c MFr Bld: 6.1 % — ABNORMAL HIGH (ref 4.8–5.6)

## 2018-11-28 LAB — TSH: TSH: 2.55 u[IU]/mL (ref 0.450–4.500)

## 2018-11-28 NOTE — Telephone Encounter (Signed)
Patient advised as directed below. 

## 2018-11-28 NOTE — Telephone Encounter (Signed)
-----   Message from Mar Daring, PA-C sent at 11/28/2018  2:27 PM EDT ----- WBC count is borderline elevated. This could be due to possible infection vs stress. If desired we can recheck in 4 weeks. Kidney function and liver enzymes are normal. Sodium, potassium and calcium are normal. A1c up slightly to 6.1 from 5.9. Cholesterol is normal and stable. Thyroid is normal.

## 2019-01-22 ENCOUNTER — Other Ambulatory Visit: Payer: Self-pay | Admitting: Physician Assistant

## 2019-01-22 DIAGNOSIS — G4700 Insomnia, unspecified: Secondary | ICD-10-CM

## 2019-01-22 MED ORDER — ZOLPIDEM TARTRATE 5 MG PO TABS: 5.0000 mg | ORAL_TABLET | Freq: Every evening | ORAL | 1 refills | Status: DC | PRN

## 2019-01-22 NOTE — Telephone Encounter (Signed)
[facility] faxed refill request for the following medications:  zolpidem (AMBIEN) 5 MG tablet  90 day supply  LOV: 11/24/2018 Please advise. Thanks TNP

## 2019-05-02 ENCOUNTER — Ambulatory Visit
Admission: RE | Admit: 2019-05-02 | Discharge: 2019-05-02 | Disposition: A | Discharge status: Home / Self Care | Payer: Medicare Other | Source: Ambulatory Visit | Attending: Physician Assistant | Admitting: Physician Assistant

## 2019-05-02 DIAGNOSIS — Z1239 Encounter for other screening for malignant neoplasm of breast: Secondary | ICD-10-CM

## 2019-05-02 DIAGNOSIS — Z1231 Encounter for screening mammogram for malignant neoplasm of breast: Secondary | ICD-10-CM | POA: Diagnosis not present

## 2019-06-06 ENCOUNTER — Other Ambulatory Visit: Payer: Self-pay | Admitting: Physician Assistant

## 2019-06-06 DIAGNOSIS — E782 Mixed hyperlipidemia: Secondary | ICD-10-CM

## 2019-06-06 MED ORDER — SIMVASTATIN 10 MG PO TABS: 10.0000 mg | ORAL_TABLET | Freq: Every day | ORAL | 1 refills | Status: DC

## 2019-06-06 NOTE — Telephone Encounter (Signed)
[facility] faxed refill request for the following medications:   simvastatin (ZOCOR)  tablet   Please advise.  Thanks, [employer]

## 2019-06-29 ENCOUNTER — Telehealth: Payer: Self-pay | Admitting: Physician Assistant

## 2019-06-29 ENCOUNTER — Other Ambulatory Visit: Payer: Self-pay | Admitting: Physician Assistant

## 2019-06-29 DIAGNOSIS — G4700 Insomnia, unspecified: Secondary | ICD-10-CM

## 2019-06-29 MED ORDER — ZOLPIDEM TARTRATE 5 MG PO TABS: 5.0000 mg | ORAL_TABLET | Freq: Every evening | ORAL | 1 refills | Status: DC | PRN

## 2019-06-29 NOTE — Telephone Encounter (Signed)
[facility] faxed refill request for the following medications:  amLODipine (NORVASC) 5 MG tablet  bisoprolol-hydrochlorothiazide (ZIAC) 10-6.25 MG tablet  fluticasone (FLONASE) 50 MCG/ACT nasal spray   Please advise.

## 2019-06-29 NOTE — Telephone Encounter (Signed)
[facility] faxed refill request for the following medications:  zolpidem (AMBIEN) 5 MG tablet   90 day supply Last Rx: 01/22/2019 LOV: 11/24/2018 Please advise. Thanks TNP

## 2019-07-02 ENCOUNTER — Other Ambulatory Visit: Payer: Self-pay | Admitting: Physician Assistant

## 2019-07-02 DIAGNOSIS — J301 Allergic rhinitis due to pollen: Secondary | ICD-10-CM

## 2019-07-02 DIAGNOSIS — I10 Essential (primary) hypertension: Secondary | ICD-10-CM

## 2019-07-02 MED ORDER — BISOPROLOL-HYDROCHLOROTHIAZIDE 10-6.25 MG PO TABS: 1.0000 | ORAL_TABLET | Freq: Every day | ORAL | 3 refills | Status: DC

## 2019-07-02 MED ORDER — AMLODIPINE BESYLATE 5 MG PO TABS: 5.0000 mg | ORAL_TABLET | Freq: Every day | ORAL | 3 refills | Status: DC

## 2019-07-02 MED ORDER — FLUTICASONE PROPIONATE 50 MCG/ACT NA SUSP
2.0000 | Freq: Every day | NASAL | 3 refills | Status: DC
Start: 2019-07-02 — End: 2021-02-20

## 2019-07-02 NOTE — Telephone Encounter (Signed)
[facility] faxed refill request for the following medications:   amLODipine (NORVASC) tablet bisoprolol-hydrochlorothiazide (ZIAC) tablet fluticasone (FLONASE) 50 MCG/ACT nasal spray   Please advise.  Thanks, [employer]

## 2019-10-17 NOTE — Progress Notes (Signed)
Subjective:   Marissa Hahn is a 74 y.o. female who presents for Medicare Annual (Subsequent) preventive examination.  I connected with Margrette Wynia today by telephone and verified that I am speaking with the correct person using two identifiers. Location patient: home Location provider: work Persons participating in the virtual visit: patient, provider.   I discussed the limitations, risks, security and privacy concerns of performing an evaluation and management service by telephone and the availability of in person appointments. I also discussed with the patient that there may be a patient responsible charge related to this service. The patient expressed understanding and verbally consented to this telephonic visit.    Interactive audio and video telecommunications were attempted between this provider and patient, however failed, due to patient having technical difficulties OR patient did not have access to video capability.  We continued and completed visit with audio only.   Review of Systems    N/A  Cardiac Risk Factors include: advanced age (>72men, >24 women);dyslipidemia;hypertension     Objective:    There were no vitals filed for this visit. There is no height or weight on file to calculate BMI.  Advanced Directives 10/18/2019 10/17/2018 10/04/2017 10/07/2016 10/05/2016 03/25/2015 12/31/2014  Does Patient Have a Medical Advance Directive? Yes Yes No No No Yes No  Type of Estate agent of [employer] Power of [place];Living will - - - Public affairs consultant of [place];Living will -  Does patient want to make changes to medical advance directive? - - No - Patient declined - - - -  Copy of Healthcare Power of Attorney in Chart? No - copy requested No - copy requested - - - - -  Would patient like information on creating a medical advance directive? - - No - Patient declined - - - -    Current Medications (verified) Outpatient Encounter Medications  as of 10/18/2019  Medication Sig  . amLODipine (NORVASC) 5 MG tablet Take 1 tablet (5 mg total) by mouth daily.  . bisoprolol-hydrochlorothiazide (ZIAC) 10-6.25 MG tablet Take 1 tablet by mouth daily.  . fluticasone (FLONASE) 50 MCG/ACT nasal spray Place 2 sprays into both nostrils daily.  Marland Kitchen loratadine (CLARITIN) 10 MG tablet Take 10 mg by mouth daily as needed.   . pseudoephedrine-acetaminophen (TYLENOL SINUS) 30-500 MG TABS tablet Take 1 tablet by mouth every 4 (four) hours as needed.  . simvastatin (ZOCOR) 10 MG tablet Take 1 tablet (10 mg total) by mouth daily at 6 PM.  . Vitamin D, Cholecalciferol, 1000 units CAPS Take 1 capsule by mouth daily.  Marland Kitchen zolpidem (AMBIEN) 5 MG tablet Take 1 tablet (5 mg total) by mouth at bedtime as needed.  . meloxicam (MOBIC) 7.5 MG tablet Take 1 tablet (7.5 mg total) by mouth daily. (Patient not taking: Reported on 10/17/2018)   No facility-administered encounter medications on file as of 10/18/2019.    Allergies (verified) Acetaminophen-codeine   History: Past Medical History:  Diagnosis Date  . Hyperlipidemia   . Hypertension    Past Surgical History:  Procedure Laterality Date  . ABDOMINAL HYSTERECTOMY  1990   Abdominal  . CARPAL TUNNEL RELEASE Left 01/2010   Family History  Problem Relation Age of Onset  . Hypertension Mother   . Lung cancer Father   . Healthy Other   . Hypertension Sister   . Hypertension Sister   . Hypertension Sister   . Breast cancer Neg Hx    Social History   Socioeconomic History  . Marital status:  Divorced    Spouse name: Not on file  . Number of children: 2  . Years of education: H/S  . Highest education level: 12th grade  Occupational History  . Occupation: Retired  Tobacco Use  . Smoking status: Never Smoker  . Smokeless tobacco: Never Used  Vaping Use  . Vaping Use: Never used  Substance and Sexual Activity  . Alcohol use: Not Currently  . Drug use: No  . Sexual activity: Not on file  Other Topics  Concern  . Not on file  Social History Narrative  . Not on file   Social Determinants of Health   Financial Resource Strain: Low Risk   . Difficulty of Paying Living Expenses: Not hard at all  Food Insecurity: No Food Insecurity  . Worried About Programme researcher, broadcasting/film/video in the Last Year: Never true  . Ran Out of Food in the Last Year: Never true  Transportation Needs: No Transportation Needs  . Lack of Transportation (Medical): No  . Lack of Transportation (Non-Medical): No  Physical Activity: Inactive  . Days of Exercise per Week: 0 days  . Minutes of Exercise per Session: 0 min  Stress: No Stress Concern Present  . Feeling of Stress : Not at all  Social Connections: Moderately Isolated  . Frequency of Communication with Friends and Family: More than three times a week  . Frequency of Social Gatherings with Friends and Family: More than three times a week  . Attends Religious Services: Never  . Active Member of Clubs or Organizations: Yes  . Attends Banker Meetings: More than 4 times per year  . Marital Status: Divorced    Tobacco Counseling Counseling given: Not Answered   Clinical Intake:  Pre-visit preparation completed: Yes  Pain : No/denies pain     Nutritional Risks: None Diabetes: No  How often do you need to have someone help you when you read instructions, pamphlets, or other written materials from your doctor or pharmacy?: 1 - Never  Diabetic? No  Interpreter Needed?: No  Information entered by :: [facility], LPN   Activities of Daily Living In your present state of health, do you have any difficulty performing the following activities: 10/18/2019  Hearing? N  Vision? N  Difficulty concentrating or making decisions? N  Walking or climbing stairs? N  Dressing or bathing? N  Doing errands, shopping? N  Preparing Food and eating ? N  Using the Toilet? N  In the past six months, have you accidently leaked urine? N  Do you have problems with  loss of bowel control? N  Managing your Medications? N  Managing your Finances? N  Housekeeping or managing your Housekeeping? N  Some recent data might be hidden    Patient Care Team: Margaretann Loveless, PA-C as PCP - General (Family Medicine) [place], [facility]  Indicate any recent Medical Services you may have received from other than [facility] providers in the past year (date may be approximate).     Assessment:   This is a routine wellness examination for Rylie.  Hearing/Vision screen No exam data present  Dietary issues and exercise activities discussed: Current Exercise Habits: The patient does not participate in regular exercise at present, Exercise limited by: None identified  Goals    . Exercise 3x per week (30 min per time)     Recommend to start walking 3 days a week for 30 minutes.       Depression Screen PHQ 2/9 Scores  10/18/2019 10/17/2018 10/04/2017 04/06/2017 10/05/2016 10/05/2016 04/27/2016  PHQ - 2 Score 0 0 1 0 0 0 0  PHQ- 9 Score - - - 4 4 - -    Fall Risk Fall Risk  10/18/2019 10/17/2018 10/04/2017 06/02/2017 10/05/2016  Falls in the past year? 0 0 No No No  Number falls in past yr: 0 - - - -  Injury with Fall? 0 - - - -    Any stairs in or around the home? Yes  If so, are there any without handrails? No  Home free of loose throw rugs in walkways, pet beds, electrical cords, etc? Yes  Adequate lighting in your home to reduce risk of falls? Yes   ASSISTIVE DEVICES UTILIZED TO PREVENT FALLS:  Life alert? No  Use of a cane, walker or w/c? No  Grab bars in the bathroom? Yes  Shower chair or bench in shower? Yes  Elevated toilet seat or a handicapped toilet? Yes    Cognitive Function:     6CIT Screen 10/05/2016  What Year? 0 points  What month? 0 points  What time? 0 points  Count back from 20 0 points  Months in reverse 0 points  Repeat phrase 0 points  Total Score 0    Immunizations Immunization History  Administered Date(s) Administered    . Influenza-Unspecified 03/05/2019  . Moderna SARS-COVID-2 Vaccination 05/23/2019, 06/20/2019  . Pneumococcal Conjugate-13 08/24/2013  . Pneumococcal Polysaccharide-23 03/25/2015    TDAP status: Due, Education has been provided regarding the importance of this vaccine. Advised may receive this vaccine at local pharmacy or Health Dept. Aware to provide a copy of the vaccination record if obtained from local pharmacy or Health Dept. Verbalized acceptance and understanding. Flu Vaccine status: Up to date Pneumococcal vaccine status: Up to date Covid-19 vaccine status: Completed vaccines  Qualifies for Shingles Vaccine? Yes   Zostavax completed No   Shingrix Completed?: No.    Education has been provided regarding the importance of this vaccine. Patient has been advised to call insurance company to determine out of pocket expense if they have not yet received this vaccine. Advised may also receive vaccine at local pharmacy or Health Dept. Verbalized acceptance and understanding.  Screening Tests Health Maintenance  Topic Date Due  . DEXA SCAN  12/09/2018  . COLONOSCOPY  04/19/2026 (Originally 08/01/2016)  . TETANUS/TDAP  04/19/2026 (Originally 02/25/1965)  . INFLUENZA VACCINE  11/18/2019  . MAMMOGRAM  05/01/2021  . COVID-19 Vaccine  Completed  . Hepatitis C Screening  Completed  . PNA vac Low Risk Adult  Completed    Health Maintenance  Health Maintenance Due  Topic Date Due  . DEXA SCAN  12/09/2018    Colorectal cancer screening: Currently due. Pt declined referral and cologuard order today.  Mammogram status: Completed 05/02/19. Repeat every year Bone Density status: Currently due. Ordered today. Pt advised office will contact her to schedule apt.  Lung Cancer Screening: (Low Dose CT Chest recommended if Age 25-80 years, 30 pack-year currently smoking OR have quit w/in 15years.) does not qualify.    Additional Screening:  Hepatitis C Screening: Up to date  Vision Screening:  Recommended annual ophthalmology exams for early detection of glaucoma and other disorders of the eye. Is the patient up to date with their annual eye exam?  Yes  Who is the provider or what is the name of the office in which the patient attends annual eye exams? [facility] If pt is not established with a provider,  would they like to be referred to a provider to establish care? No .   Dental Screening: Recommended annual dental exams for proper oral hygiene  Community Resource Referral / Chronic Care Management: CRR required this visit?  No   CCM required this visit?  No      Plan:     I have personally reviewed and noted the following in the patient's chart:   . Medical and social history . Use of alcohol, tobacco or illicit drugs  . Current medications and supplements . Functional ability and status . Nutritional status . Physical activity . Advanced directives . List of other physicians . Hospitalizations, surgeries, and ER visits in previous 12 months . Vitals . Screenings to include cognitive, depression, and falls . Referrals and appointments  In addition, I have reviewed and discussed with patient certain preventive protocols, quality metrics, and best practice recommendations. A written personalized care plan for preventive services as well as general preventive health recommendations were provided to patient.     Rafael Quesada JasperMarkoski, [place]N   1/6/10967/04/2019   Nurse Notes: Pt declined a colonoscopy referral or cologuard order at this time.

## 2019-10-18 ENCOUNTER — Ambulatory Visit (INDEPENDENT_AMBULATORY_CARE_PROVIDER_SITE_OTHER): Payer: Medicare Other

## 2019-10-18 ENCOUNTER — Other Ambulatory Visit: Payer: Self-pay

## 2019-10-18 DIAGNOSIS — M810 Age-related osteoporosis without current pathological fracture: Secondary | ICD-10-CM | POA: Diagnosis not present

## 2019-10-18 DIAGNOSIS — Z0000 Encounter for general adult medical examination without abnormal findings: Secondary | ICD-10-CM

## 2019-10-18 NOTE — Patient Instructions (Signed)
[name] , Thank you for taking time to come for your Medicare Wellness Visit. I appreciate your ongoing commitment to your health goals. Please review the following plan we discussed and let me know if I can assist you in the future.   Screening recommendations/referrals: Colonoscopy: Currently due. Declined a colonoscopy referral and cologuard order at this time. Mammogram: Up to date, due 04/2020 Bone Density: Recommend to start exercising 3 days a week for 30 minutes at a time. Recommended yearly ophthalmology/optometry visit for glaucoma screening and checkup Recommended yearly dental visit for hygiene and checkup  Vaccinations: Influenza vaccine: Done 03/05/19 Pneumococcal vaccine: Completed series Tdap vaccine: Currently due. Pt declined. Shingles vaccine: Shingrix discussed. Please contact your pharmacy for coverage information.     Advanced directives: Please bring a copy of your POA (Power of Attorney) and/or Living Will to your next appointment.   Conditions/risks identified: Recommend to start walking 3 days a week for 30 minutes at a time.  Next appointment: 12/13/19 @ 8:00 AM with Marissa Hahn.   Preventive Care 72 Years and Older, Female Preventive care refers to lifestyle choices and visits with your health care provider that can promote health and wellness. What does preventive care include?  A yearly physical exam. This is also called an annual well check.  Dental exams once or twice a year.  Routine eye exams. Ask your health care provider how often you should have your eyes checked.  Personal lifestyle choices, including:  Daily care of your teeth and gums.  Regular physical activity.  Eating a healthy diet.  Avoiding tobacco and drug use.  Limiting alcohol use.  Practicing safe sex.  Taking low-dose aspirin every day.  Taking vitamin and mineral supplements as recommended by your health care provider. What happens during an annual well  check? The services and screenings done by your health care provider during your annual well check will depend on your age, overall health, lifestyle risk factors, and family history of disease. Counseling  Your health care provider may ask you questions about your:  Alcohol use.  Tobacco use.  Drug use.  Emotional well-being.  Home and relationship well-being.  Sexual activity.  Eating habits.  History of falls.  Memory and ability to understand (cognition).  Work and work Astronomer.  Reproductive health. Screening  You may have the following tests or measurements:  Height, weight, and BMI.  Blood pressure.  Lipid and cholesterol levels. These may be checked every 5 years, or more frequently if you are over 58 years old.  Skin check.  Lung cancer screening. You may have this screening every year starting at age 31 if you have a 30-pack-year history of smoking and currently smoke or have quit within the past 15 years.  Fecal occult blood test (FOBT) of the stool. You may have this test every year starting at age 67.  Flexible sigmoidoscopy or colonoscopy. You may have a sigmoidoscopy every 5 years or a colonoscopy every 10 years starting at age 55.  Hepatitis C blood test.  Hepatitis B blood test.  Sexually transmitted disease (STD) testing.  Diabetes screening. This is done by checking your blood sugar (glucose) after you have not eaten for a while (fasting). You may have this done every 1-3 years.  Bone density scan. This is done to screen for osteoporosis. You may have this done starting at age 54.  Mammogram. This may be done every 1-2 years. Talk to your health care provider about how often you should  have regular mammograms. Talk with your health care provider about your test results, treatment options, and if necessary, the need for more tests. Vaccines  Your health care provider may recommend certain vaccines, such as:  Influenza vaccine. This is  recommended every year.  Tetanus, diphtheria, and acellular pertussis (Tdap, Td) vaccine. You may need a Td booster every 10 years.  Zoster vaccine. You may need this after age 64.  Pneumococcal 13-valent conjugate (PCV13) vaccine. One dose is recommended after age 72.  Pneumococcal polysaccharide (PPSV23) vaccine. One dose is recommended after age 72. Talk to your health care provider about which screenings and vaccines you need and how often you need them. This information is not intended to replace advice given to you by your health care provider. Make sure you discuss any questions you have with your health care provider. Document Released: 05/02/2015 Document Revised: 12/24/2015 Document Reviewed: 02/04/2015 Elsevier Interactive Patient Education  2017 [place] Prevention in the Home Falls can cause injuries. They can happen to people of all ages. There are many things you can do to make your home safe and to help prevent falls. What can I do on the outside of my home?  Regularly fix the edges of walkways and driveways and fix any cracks.  Remove anything that might make you trip as you walk through a door, such as a raised step or threshold.  Trim any bushes or trees on the path to your home.  Use bright outdoor lighting.  Clear any walking paths of anything that might make someone trip, such as rocks or tools.  Regularly check to see if handrails are loose or broken. Make sure that both sides of any steps have handrails.  Any raised decks and porches should have guardrails on the edges.  Have any leaves, snow, or ice cleared regularly.  Use sand or salt on walking paths during winter.  Clean up any spills in your garage right away. This includes oil or grease spills. What can I do in the bathroom?  Use night lights.  Install grab bars by the toilet and in the tub and shower. Do not use towel bars as grab bars.  Use non-skid mats or decals in the tub or  shower.  If you need to sit down in the shower, use a plastic, non-slip stool.  Keep the floor dry. Clean up any water that spills on the floor as soon as it happens.  Remove soap buildup in the tub or shower regularly.  Attach bath mats securely with double-sided non-slip rug tape.  Do not have throw rugs and other things on the floor that can make you trip. What can I do in the bedroom?  Use night lights.  Make sure that you have a light by your bed that is easy to reach.  Do not use any sheets or blankets that are too big for your bed. They should not hang down onto the floor.  Have a firm chair that has side arms. You can use this for support while you get dressed.  Do not have throw rugs and other things on the floor that can make you trip. What can I do in the kitchen?  Clean up any spills right away.  Avoid walking on wet floors.  Keep items that you use a lot in easy-to-reach places.  If you need to reach something above you, use a strong step stool that has a grab bar.  Keep electrical cords out of  the way.  Do not use floor polish or wax that makes floors slippery. If you must use wax, use non-skid floor wax.  Do not have throw rugs and other things on the floor that can make you trip. What can I do with my stairs?  Do not leave any items on the stairs.  Make sure that there are handrails on both sides of the stairs and use them. Fix handrails that are broken or loose. Make sure that handrails are as long as the stairways.  Check any carpeting to make sure that it is firmly attached to the stairs. Fix any carpet that is loose or worn.  Avoid having throw rugs at the top or bottom of the stairs. If you do have throw rugs, attach them to the floor with carpet tape.  Make sure that you have a light switch at the top of the stairs and the bottom of the stairs. If you do not have them, ask someone to add them for you. What else can I do to help prevent  falls?  Wear shoes that:  Do not have high heels.  Have rubber bottoms.  Are comfortable and fit you well.  Are closed at the toe. Do not wear sandals.  If you use a stepladder:  Make sure that it is fully opened. Do not climb a closed stepladder.  Make sure that both sides of the stepladder are locked into place.  Ask someone to hold it for you, if possible.  Clearly mark and make sure that you can see:  Any grab bars or handrails.  First and last steps.  Where the edge of each step is.  Use tools that help you move around (mobility aids) if they are needed. These include:  Canes.  Walkers.  Scooters.  Crutches.  Turn on the lights when you go into a dark area. Replace any light bulbs as soon as they burn out.  Set up your furniture so you have a clear path. Avoid moving your furniture around.  If any of your floors are uneven, fix them.  If there are any pets around you, be aware of where they are.  Review your medicines with your doctor. Some medicines can make you feel dizzy. This can increase your chance of falling. Ask your doctor what other things that you can do to help prevent falls. This information is not intended to replace advice given to you by your health care provider. Make sure you discuss any questions you have with your health care provider. Document Released: 01/30/2009 Document Revised: 09/11/2015 Document Reviewed: 05/10/2014 Elsevier Interactive Patient Education  2017 [employer].

## 2019-11-24 ENCOUNTER — Other Ambulatory Visit: Payer: Self-pay | Admitting: Physician Assistant

## 2019-11-24 DIAGNOSIS — E782 Mixed hyperlipidemia: Secondary | ICD-10-CM

## 2019-12-13 ENCOUNTER — Encounter: Payer: Medicare Other | Admitting: Physician Assistant

## 2019-12-31 ENCOUNTER — Other Ambulatory Visit: Payer: Self-pay

## 2019-12-31 ENCOUNTER — Ambulatory Visit (INDEPENDENT_AMBULATORY_CARE_PROVIDER_SITE_OTHER): Payer: Medicare Other | Admitting: Physician Assistant

## 2019-12-31 ENCOUNTER — Encounter: Payer: Self-pay | Admitting: Physician Assistant

## 2019-12-31 ENCOUNTER — Other Ambulatory Visit: Payer: Self-pay | Admitting: Physician Assistant

## 2019-12-31 VITALS — BP 180/84 | HR 61 | Temp 98.2°F | Resp 16 | Ht 62.0 in | Wt 192.2 lb

## 2019-12-31 DIAGNOSIS — R739 Hyperglycemia, unspecified: Secondary | ICD-10-CM

## 2019-12-31 DIAGNOSIS — E782 Mixed hyperlipidemia: Secondary | ICD-10-CM

## 2019-12-31 DIAGNOSIS — Z0000 Encounter for general adult medical examination without abnormal findings: Secondary | ICD-10-CM | POA: Diagnosis not present

## 2019-12-31 DIAGNOSIS — Z1329 Encounter for screening for other suspected endocrine disorder: Secondary | ICD-10-CM

## 2019-12-31 DIAGNOSIS — I10 Essential (primary) hypertension: Secondary | ICD-10-CM | POA: Diagnosis not present

## 2019-12-31 DIAGNOSIS — Z1211 Encounter for screening for malignant neoplasm of colon: Secondary | ICD-10-CM

## 2019-12-31 MED ORDER — SIMVASTATIN 10 MG PO TABS: ORAL_TABLET | ORAL | 0 refills | Status: DC

## 2019-12-31 MED ORDER — SIMVASTATIN 10 MG PO TABS: ORAL_TABLET | ORAL | 3 refills | Status: DC

## 2019-12-31 NOTE — Patient Instructions (Signed)
Health Maintenance After Age 72 After age 72, you are at a higher risk for certain long-term diseases and infections as well as injuries from falls. Falls are a major cause of broken bones and head injuries in people who are older than age 72. Getting regular preventive care can help to keep you healthy and well. Preventive care includes getting regular testing and making lifestyle changes as recommended by your health care provider. Talk with your health care provider about:  Which screenings and tests you should have. A screening is a test that checks for a disease when you have no symptoms.  A diet and exercise plan that is right for you. What should I know about screenings and tests to prevent falls? Screening and testing are the best ways to find a health problem early. Early diagnosis and treatment give you the best chance of managing medical conditions that are common after age 72. Certain conditions and lifestyle choices may make you more likely to have a fall. Your health care provider may recommend:  Regular vision checks. Poor vision and conditions such as cataracts can make you more likely to have a fall. If you wear glasses, make sure to get your prescription updated if your vision changes.  Medicine review. Work with your health care provider to regularly review all of the medicines you are taking, including over-the-counter medicines. Ask your health care provider about any side effects that may make you more likely to have a fall. Tell your health care provider if any medicines that you take make you feel dizzy or sleepy.  Osteoporosis screening. Osteoporosis is a condition that causes the bones to get weaker. This can make the bones weak and cause them to break more easily.  Blood pressure screening. Blood pressure changes and medicines to control blood pressure can make you feel dizzy.  Strength and balance checks. Your health care provider may recommend certain tests to check your  strength and balance while standing, walking, or changing positions.  Foot health exam. Foot pain and numbness, as well as not wearing proper footwear, can make you more likely to have a fall.  Depression screening. You may be more likely to have a fall if you have a fear of falling, feel emotionally low, or feel unable to do activities that you used to do.  Alcohol use screening. Using too much alcohol can affect your balance and may make you more likely to have a fall. What actions can I take to lower my risk of falls? General instructions  Talk with your health care provider about your risks for falling. Tell your health care provider if: ? You fall. Be sure to tell your health care provider about all falls, even ones that seem minor. ? You feel dizzy, sleepy, or off-balance.  Take over-the-counter and prescription medicines only as told by your health care provider. These include any supplements.  Eat a healthy diet and maintain a healthy weight. A healthy diet includes low-fat dairy products, low-fat (lean) meats, and fiber from whole grains, beans, and lots of fruits and vegetables. Home safety  Remove any tripping hazards, such as rugs, cords, and clutter.  Install safety equipment such as grab bars in bathrooms and safety rails on stairs.  Keep rooms and walkways well-lit. Activity   Follow a regular exercise program to stay fit. This will help you maintain your balance. Ask your health care provider what types of exercise are appropriate for you.  If you need a cane or   walker, use it as recommended by your health care provider.  Wear supportive shoes that have nonskid soles. Lifestyle  Do not drink alcohol if your health care provider tells you not to drink.  If you drink alcohol, limit how much you have: ? 0-1 drink a day for women. ? 0-2 drinks a day for men.  Be aware of how much alcohol is in your drink. In the [place] one drink equals one typical bottle of beer (12  oz), one-half glass of wine (5 oz), or one shot of hard liquor (1 oz).  Do not use any products that contain nicotine or tobacco, such as cigarettes and e-cigarettes. If you need help quitting, ask your health care provider. Summary  Having a healthy lifestyle and getting preventive care can help to protect your health and wellness after age 72.  Screening and testing are the best way to find a health problem early and help you avoid having a fall. Early diagnosis and treatment give you the best chance for managing medical conditions that are more common for people who are older than age 72.  Falls are a major cause of broken bones and head injuries in people who are older than age 72. Take precautions to prevent a fall at home.  Work with your health care provider to learn what changes you can make to improve your health and wellness and to prevent falls. This information is not intended to replace advice given to you by your health care provider. Make sure you discuss any questions you have with your health care provider. Document Revised: 07/27/2018 Document Reviewed: 02/16/2017 Elsevier Patient Education  2020 Elsevier Inc.  

## 2019-12-31 NOTE — Telephone Encounter (Signed)
[facility] faxed refill request for the following medications:  simvastatin (ZOCOR)   Please advise. Thanks, [employer]

## 2019-12-31 NOTE — Progress Notes (Signed)
Complete physical exam   [name]   DOB: 1945/08/28   74 y.o. Female  MRN: [id number] Visit Date: 12/31/2019  Today's healthcare provider: Margaretann Loveless, PA-C   Chief Complaint  Patient presents with  . Annual Exam   Subjective    Marissa Hahn is a 74 y.o. female who presents today for a complete physical exam.  She reports consuming a general diet. Home exercise routine includes walking daily. She generally feels well. She reports sleeping fairly well. She does not have additional problems to discuss today.  HPI  Patient had AWV with [facility] 10/18/19 Bone Density scheduled 01/17/20 Mammogram done 05/02/19 Normal  Past Medical History:  Diagnosis Date  . Hyperlipidemia   . Hypertension    Past Surgical History:  Procedure Laterality Date  . ABDOMINAL HYSTERECTOMY  1990   Abdominal  . CARPAL TUNNEL RELEASE Left 01/2010   Social History   Socioeconomic History  . Marital status: Divorced    Spouse name: Not on file  . Number of children: 2  . Years of education: H/S  . Highest education level: 12th grade  Occupational History  . Occupation: Retired  Tobacco Use  . Smoking status: Never Smoker  . Smokeless tobacco: Never Used  Vaping Use  . Vaping Use: Never used  Substance and Sexual Activity  . Alcohol use: Not Currently  . Drug use: No  . Sexual activity: Not on file  Other Topics Concern  . Not on file  Social History Narrative  . Not on file   Social Determinants of Health   Financial Resource Strain: Low Risk   . Difficulty of Paying Living Expenses: Not hard at all  Food Insecurity: No Food Insecurity  . Worried About Programme researcher, broadcasting/film/video in the Last Year: Never true  . Ran Out of Food in the Last Year: Never true  Transportation Needs: No Transportation Needs  . Lack of Transportation (Medical): No  . Lack of Transportation (Non-Medical): No  Physical Activity: Inactive  . Days of Exercise per Week: 0 days  .  Minutes of Exercise per Session: 0 min  Stress: No Stress Concern Present  . Feeling of Stress : Not at all  Social Connections: Moderately Isolated  . Frequency of Communication with Friends and Family: More than three times a week  . Frequency of Social Gatherings with Friends and Family: More than three times a week  . Attends Religious Services: Never  . Active Member of Clubs or Organizations: Yes  . Attends Banker Meetings: More than 4 times per year  . Marital Status: Divorced  Catering manager Violence: Not At Risk  . Fear of Current or Ex-Partner: No  . Emotionally Abused: No  . Physically Abused: No  . Sexually Abused: No   Family Status  Relation Name Status  . Mother  Deceased  . Father  Deceased at age Mid 1's       Lung Cancer  . Other 6 Siblings Alive  . Sister  Alive  . Brother  Deceased  . Daughter  Alive  . Sister  Alive  . Sister  Alive  . Brother  Alive  . Brother  Alive  . Daughter  Alive  . Neg Hx  (Not Specified)   Family History  Problem Relation Age of Onset  . Hypertension Mother   . Lung cancer Father   . Healthy Other   . Hypertension Sister   . Hypertension  Sister   . Hypertension Sister   . Breast cancer Neg Hx    Allergies  Allergen Reactions  . Acetaminophen-Codeine     Patient Care Team: Reine Just as PCP - General (Family Medicine) Pa, [facility]   Medications: Outpatient Medications Prior to Visit  Medication Sig  . amLODipine (NORVASC) 5 MG tablet Take 1 tablet (5 mg total) by mouth daily.  . bisoprolol-hydrochlorothiazide (ZIAC) 10-6.25 MG tablet Take 1 tablet by mouth daily.  . fluticasone (FLONASE) 50 MCG/ACT nasal spray Place 2 sprays into both nostrils daily.  . pseudoephedrine-acetaminophen (TYLENOL SINUS) 30-500 MG TABS tablet Take 1 tablet by mouth every 4 (four) hours as needed.  . simvastatin (ZOCOR) 10 MG tablet TAKE 1 TABLET BY MOUTH  DAILY AT 6 PM  . Vitamin D,  Cholecalciferol, 1000 units CAPS Take 1 capsule by mouth daily.  Marland Kitchen zolpidem (AMBIEN) 5 MG tablet Take 1 tablet (5 mg total) by mouth at bedtime as needed.  . loratadine (CLARITIN) 10 MG tablet Take 10 mg by mouth daily as needed.   . meloxicam (MOBIC) 7.5 MG tablet Take 1 tablet (7.5 mg total) by mouth daily. (Patient not taking: Reported on 10/17/2018)   No facility-administered medications prior to visit.    Review of Systems  Constitutional: Negative.   HENT: Negative.   Eyes: Negative.   Respiratory: Negative.   Cardiovascular: Negative.   Gastrointestinal: Negative.   Endocrine: Negative.   Genitourinary: Negative.   Musculoskeletal: Positive for back pain ("sometimes") and myalgias.  Skin: Negative.   Allergic/Immunologic: Positive for food allergies.  Neurological: Positive for headaches.  Hematological: Negative.   Psychiatric/Behavioral: Positive for sleep disturbance.    Last CBC Lab Results  Component Value Date   WBC 11.0 (H) 11/27/2018   HGB 12.4 11/27/2018   HCT 38.8 11/27/2018   MCV 85 11/27/2018   MCH 27.2 11/27/2018   RDW 13.0 11/27/2018   PLT 376 11/27/2018   Last metabolic panel Lab Results  Component Value Date   GLUCOSE 115 (H) 11/27/2018   NA 140 11/27/2018   K 3.8 11/27/2018   CL 102 11/27/2018   CO2 20 11/27/2018   BUN 11 11/27/2018   CREATININE 0.90 11/27/2018   GFRNONAA 65 11/27/2018   GFRAA 75 11/27/2018   CALCIUM 9.4 11/27/2018   PROT 7.2 11/27/2018   ALBUMIN 4.1 11/27/2018   LABGLOB 3.1 11/27/2018   AGRATIO 1.3 11/27/2018   BILITOT 0.3 11/27/2018   ALKPHOS 82 11/27/2018   AST 18 11/27/2018   ALT 13 11/27/2018   Last lipids Lab Results  Component Value Date   CHOL 166 11/27/2018   HDL 45 11/27/2018   LDLCALC 102 (H) 11/27/2018   TRIG 94 11/27/2018   CHOLHDL 3.7 11/27/2018      Objective    BP (!) 180/84 (BP Location: Right Arm, Patient Position: Sitting, Cuff Size: Large)   Pulse 61   Temp 98.2 F (36.8 C) (Oral)    Resp 16   Ht 5\' 2"  (1.575 m)   Wt 192 lb 3.2 oz (87.2 kg)   BMI 35.15 kg/m  BP Readings from Last 3 Encounters:  12/31/19 (!) 180/84  11/24/18 (!) 166/80  10/05/17 (!) 148/78   Wt Readings from Last 3 Encounters:  12/31/19 192 lb 3.2 oz (87.2 kg)  11/24/18 199 lb 9.6 oz (90.5 kg)  10/05/17 188 lb (85.3 kg)      Physical Exam Vitals reviewed.  Constitutional:      General:  She is not in acute distress.    Appearance: Normal appearance. She is well-developed. She is not ill-appearing or diaphoretic.  HENT:     Head: Normocephalic and atraumatic.     Right Ear: Tympanic membrane, ear canal and external ear normal.     Left Ear: Tympanic membrane, ear canal and external ear normal.  Eyes:     General: No scleral icterus.       Right eye: No discharge.        Left eye: No discharge.     Extraocular Movements: Extraocular movements intact.     Conjunctiva/sclera: Conjunctivae normal.     Pupils: Pupils are equal, round, and reactive to light.  Neck:     Thyroid: No thyromegaly.     Vascular: No carotid bruit or JVD.     Trachea: No tracheal deviation.  Cardiovascular:     Rate and Rhythm: Normal rate and regular rhythm.     Pulses: Normal pulses.     Heart sounds: Normal heart sounds. No murmur heard.  No friction rub. No gallop.   Pulmonary:     Effort: Pulmonary effort is normal. No respiratory distress.     Breath sounds: Normal breath sounds. No wheezing or rales.  Chest:     Chest wall: No tenderness.  Abdominal:     General: Abdomen is flat. Bowel sounds are normal. There is no distension.     Palpations: Abdomen is soft. There is no mass.     Tenderness: There is no abdominal tenderness. There is no guarding or rebound.  Musculoskeletal:        General: No tenderness. Normal range of motion.     Cervical back: Normal range of motion and neck supple.     Right lower leg: No edema.     Left lower leg: No edema.  Lymphadenopathy:     Cervical: No cervical  adenopathy.  Skin:    General: Skin is warm and dry.     Capillary Refill: Capillary refill takes less than 2 seconds.     Findings: No rash.  Neurological:     General: No focal deficit present.     Mental Status: She is alert and oriented to person, place, and time. Mental status is at baseline.  Psychiatric:        Mood and Affect: Mood normal.        Behavior: Behavior normal.        Thought Content: Thought content normal.        Judgment: Judgment normal.       Last depression screening scores PHQ 2/9 Scores 10/18/2019 10/17/2018 10/04/2017  PHQ - 2 Score 0 0 1  PHQ- 9 Score - - -   Last fall risk screening Fall Risk  10/18/2019  Falls in the past year? 0  Number falls in past yr: 0  Injury with Fall? 0   Last Audit-C alcohol use screening Alcohol Use Disorder Test (AUDIT) 10/18/2019  1. How often do you have a drink containing alcohol? 0  2. How many drinks containing alcohol do you have on a typical day when you are drinking? 0  3. How often do you have six or more drinks on one occasion? 0  AUDIT-C Score 0  Alcohol Brief Interventions/Follow-up AUDIT Score <7 follow-up not indicated   A score of 3 or more in women, and 4 or more in men indicates increased risk for alcohol abuse, EXCEPT if all of the points are from question  1   No results found for any visits on 12/31/19.  Assessment & Plan    Routine Health Maintenance and Physical Exam  Exercise Activities and Dietary recommendations Goals    . Exercise 3x per week (30 min per time)     Recommend to start walking 3 days a week for 30 minutes.        Immunization History  Administered Date(s) Administered  . Influenza-Unspecified 03/05/2019  . Moderna SARS-COVID-2 Vaccination 05/23/2019, 06/20/2019  . Pneumococcal Conjugate-13 08/24/2013  . Pneumococcal Polysaccharide-23 03/25/2015    Health Maintenance  Topic Date Due  . DEXA SCAN  12/09/2018  . INFLUENZA VACCINE  02/17/2020 (Originally 11/18/2019)  .  COLONOSCOPY  04/19/2026 (Originally 08/01/2016)  . TETANUS/TDAP  04/19/2026 (Originally 02/25/1967)  . MAMMOGRAM  05/01/2021  . COVID-19 Vaccine  Completed  . Hepatitis C Screening  Completed  . PNA vac Low Risk Adult  Completed    Discussed health benefits of physical activity, and encouraged her to engage in regular exercise appropriate for her age and condition.  1. Annual physical exam Normal physical exam today. Will check labs as below and f/u pending lab results. If labs are stable and WNL she will not need to have these rechecked for one year at her next annual physical exam. She is to call the office in the meantime if she has any acute issue, questions or concerns. - CBC with Differential/Platelet - Comprehensive metabolic panel - Lipid panel  2. Essential hypertension Elevated today. Continue Amlodipine 5mg , Ziac 10-6.25mg . Avoid products with sudafed. She will check BP at home and call if >140/90.  - TSH  3. Blood glucose elevated Diet controlled. Will check labs as below and f/u pending results. 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 Diagnosis pulled for medication refill. Continue current medical treatment plan. Will check labs as below and f/u pending results. - Lipid panel - simvastatin (ZOCOR) 10 MG tablet; TAKE 1 TABLET BY MOUTH  DAILY AT 6 PM  Dispense: 90 tablet; Refill: 3  5. Screening for colon cancer Cologuard ordered. No previous colonoscopy. No colon cancer family history. - Cologuard  6. Screening for thyroid disorder Will check labs as below and f/u pending results. - TSH   No follow-ups on file.     Delmer IslamI, Meyer Arora M Kaenan Jake, PA-C, have reviewed all documentation for this visit. The documentation on 01/08/20 for the exam, diagnosis, procedures, and orders are all accurate and complete.   Reine JustJennifer M Lux Meaders, PA-C  [facility] [phone] (phone) [phone] (fax)  [facility]

## 2020-01-01 LAB — CBC WITH DIFFERENTIAL/PLATELET
Basophils Absolute: 0.1 10*3/uL (ref 0.0–0.2)
Basos: 1 %
EOS (ABSOLUTE): 0.3 10*3/uL (ref 0.0–0.4)
Eos: 2 %
Hematocrit: 40.3 % (ref 34.0–46.6)
Hemoglobin: 13.1 g/dL (ref 11.1–15.9)
Immature Grans (Abs): 0 10*3/uL (ref 0.0–0.1)
Immature Granulocytes: 0 %
Lymphocytes Absolute: 4.6 10*3/uL — ABNORMAL HIGH (ref 0.7–3.1)
Lymphs: 37 %
MCH: 27.9 pg (ref 26.6–33.0)
MCHC: 32.5 g/dL (ref 31.5–35.7)
MCV: 86 fL (ref 79–97)
Monocytes Absolute: 0.7 10*3/uL (ref 0.1–0.9)
Monocytes: 5 %
Neutrophils Absolute: 6.8 10*3/uL (ref 1.4–7.0)
Neutrophils: 55 %
Platelets: 386 10*3/uL (ref 150–450)
RBC: 4.7 x10E6/uL (ref 3.77–5.28)
RDW: 12.6 % (ref 11.7–15.4)
WBC: 12.4 10*3/uL — ABNORMAL HIGH (ref 3.4–10.8)

## 2020-01-01 LAB — COMPREHENSIVE METABOLIC PANEL
ALT: 15 IU/L (ref 0–32)
AST: 17 IU/L (ref 0–40)
Albumin/Globulin Ratio: 1.4 (ref 1.2–2.2)
Albumin: 4.5 g/dL (ref 3.7–4.7)
Alkaline Phosphatase: 102 IU/L (ref 44–121)
BUN/Creatinine Ratio: 12 (ref 12–28)
BUN: 10 mg/dL (ref 8–27)
Bilirubin Total: 0.4 mg/dL (ref 0.0–1.2)
CO2: 22 mmol/L (ref 20–29)
Calcium: 9.7 mg/dL (ref 8.7–10.3)
Chloride: 101 mmol/L (ref 96–106)
Creatinine, Ser: 0.82 mg/dL (ref 0.57–1.00)
GFR calc Af Amer: 83 mL/min/{1.73_m2} (ref >59)
GFR calc non Af Amer: 72 mL/min/{1.73_m2} (ref >59)
Globulin, Total: 3.2 g/dL (ref 1.5–4.5)
Glucose: 100 mg/dL — ABNORMAL HIGH (ref 65–99)
Potassium: 3.9 mmol/L (ref 3.5–5.2)
Sodium: 139 mmol/L (ref 134–144)
Total Protein: 7.7 g/dL (ref 6.0–8.5)

## 2020-01-01 LAB — LIPID PANEL
Chol/HDL Ratio: 4.2 ratio (ref 0.0–4.4)
Cholesterol, Total: 211 mg/dL — ABNORMAL HIGH (ref 100–199)
HDL: 50 mg/dL (ref >39)
LDL Chol Calc (NIH): 136 mg/dL — ABNORMAL HIGH (ref 0–99)
Triglycerides: 141 mg/dL (ref 0–149)
VLDL Cholesterol Cal: 25 mg/dL (ref 5–40)

## 2020-01-01 LAB — TSH: TSH: 1.77 u[IU]/mL (ref 0.450–4.500)

## 2020-01-01 LAB — HEMOGLOBIN A1C
Est. average glucose Bld gHb Est-mCnc: 128 mg/dL
Hgb A1c MFr Bld: 6.1 % — ABNORMAL HIGH (ref 4.8–5.6)

## 2020-01-02 ENCOUNTER — Telehealth: Payer: Self-pay

## 2020-01-02 NOTE — Telephone Encounter (Signed)
Attempted to contact patient.  Unable to leave message. VM has not been set up.  Will try again later.

## 2020-01-02 NOTE — Telephone Encounter (Signed)
-----   Message from Margaretann Loveless, PA-C sent at 01/01/2020  3:39 PM EDT ----- WBC and lymphocytes continue to increase slightly compared to previous years. We could recheck labs in 3-4 weeks and I could add some labs to see if anything appears abnormal and consider hematology referral if anything on follow up testing is abnormal. Kidney and liver function are normal. Sodium, potassium, and calcium are normal. A1c/sugar is stable at 6.1. Cholesterol has increased slightly compared to previous years so do feel continuing simvastatin is worthwhile. Thyroid is normal.

## 2020-01-02 NOTE — Telephone Encounter (Signed)
Reviewed lab results and provider's note with the patient. She will pick up simvastatin soon. Will come in for recheck on labs mid October. No further questions.

## 2020-01-02 NOTE — Telephone Encounter (Signed)
LMTCB-If patient calls back ok for [facility] Nurse to give labs results. Thanks.

## 2020-01-03 ENCOUNTER — Other Ambulatory Visit: Payer: Self-pay | Admitting: Physician Assistant

## 2020-01-03 ENCOUNTER — Telehealth: Payer: Self-pay | Admitting: *Deleted

## 2020-01-03 DIAGNOSIS — D72820 Lymphocytosis (symptomatic): Secondary | ICD-10-CM

## 2020-01-03 DIAGNOSIS — G4700 Insomnia, unspecified: Secondary | ICD-10-CM

## 2020-01-03 NOTE — Telephone Encounter (Signed)
Requested medication (s) are due for refill today: yes  Requested medication (s) are on the active medication list: yes  Last refill:  06/29/19 #90 1 refill  Future visit scheduled: no just seen yesterday   Notes to clinic:  not delegated per protocol     Requested Prescriptions  Pending Prescriptions Disp Refills   zolpidem (AMBIEN) 5 MG tablet [Pharmacy Med Name: ZOLPIDEM  5MG   TAB] 90 tablet     Sig: TAKE 1 TABLET BY MOUTH AT  BEDTIME AS NEEDED      Not Delegated - Psychiatry:  Anxiolytics/Hypnotics Failed - 01/03/2020  5:34 PM      Failed - This refill cannot be delegated      Failed - Urine Drug Screen completed in last 360 days.      Passed - Valid encounter within last 6 months    Recent Outpatient Visits           3 days ago Annual physical exam   [facility] [place], [place], [place]   1 year ago Annual physical exam   [facility] [place], [place], [place]   2 years ago Essential hypertension   [facility] [place], [place], [place]   2 years ago Primary insomnia   [facility] [place], [place], [place]   2 years ago Essential hypertension   [facility], Margaretann Loveless

## 2020-01-03 NOTE — Telephone Encounter (Signed)
Reviewed lab results and provider's note with the patient. Routing to clinic for future lab orders to be placed. WBC and lymphocytes were elevated. Victorino Dike wanted a repeat in 3-4 weeks.

## 2020-01-03 NOTE — Addendum Note (Signed)
Addended by: Margaretann Loveless on: 01/03/2020 03:13 PM   Modules accepted: Orders

## 2020-01-17 ENCOUNTER — Other Ambulatory Visit: Payer: Medicare Other

## 2020-01-19 DIAGNOSIS — M25531 Pain in right wrist: Secondary | ICD-10-CM | POA: Diagnosis not present

## 2020-01-19 DIAGNOSIS — S63501A Unspecified sprain of right wrist, initial encounter: Secondary | ICD-10-CM | POA: Diagnosis not present

## 2020-03-04 DIAGNOSIS — H2513 Age-related nuclear cataract, bilateral: Secondary | ICD-10-CM | POA: Diagnosis not present

## 2020-04-29 ENCOUNTER — Other Ambulatory Visit: Payer: Self-pay | Admitting: Physician Assistant

## 2020-04-29 DIAGNOSIS — I10 Essential (primary) hypertension: Secondary | ICD-10-CM

## 2020-05-15 ENCOUNTER — Other Ambulatory Visit: Payer: Self-pay | Admitting: Physician Assistant

## 2020-05-15 DIAGNOSIS — I10 Essential (primary) hypertension: Secondary | ICD-10-CM

## 2020-07-15 ENCOUNTER — Other Ambulatory Visit: Payer: Self-pay | Admitting: Physician Assistant

## 2020-07-15 DIAGNOSIS — G4700 Insomnia, unspecified: Secondary | ICD-10-CM

## 2020-07-15 NOTE — Telephone Encounter (Signed)
Requested medication (s) are due for refill today: yes  Requested medication (s) are on the active medication list: yes  Last refill:  01/04/20 #90 1 refill  Future visit scheduled: no   Notes to clinic:  not delegated per protocol. Patient needs appt scheduled. Please contact patient with who her next provider will be after J. Burnette, PA leaves. Unable to schedule appt prior to 07/24/20 with PCP. Patient has no tabs left.      Requested Prescriptions  Pending Prescriptions Disp Refills   zolpidem (AMBIEN) 5 MG tablet [Pharmacy Med Name: Zolpidem Tartrate 5 MG Oral Tablet] 90 tablet     Sig: TAKE 1 TABLET BY MOUTH AT  BEDTIME AS NEEDED      Not Delegated - Psychiatry:  Anxiolytics/Hypnotics Failed - 07/15/2020  4:56 PM      Failed - This refill cannot be delegated      Failed - Urine Drug Screen completed in last 360 days      Failed - Valid encounter within last 6 months    Recent Outpatient Visits           6 months ago Annual physical exam   [facility] [place], [place], [place]   1 year ago Annual physical exam   [facility] [place], [place], [place]   2 years ago Essential hypertension   [facility] [place], [place], [place]   3 years ago Primary insomnia   [facility] [place], [place], [place]   3 years ago Essential hypertension   [facility] [place], [place], [place]

## 2020-12-31 ENCOUNTER — Other Ambulatory Visit: Payer: Self-pay | Admitting: Physician Assistant

## 2020-12-31 DIAGNOSIS — G4700 Insomnia, unspecified: Secondary | ICD-10-CM

## 2021-01-05 ENCOUNTER — Telehealth: Payer: Self-pay

## 2021-01-05 NOTE — Telephone Encounter (Signed)
Copied from CRM [id number]. Topic: General - Other >> Jan 05, 2021  8:13 AM Jaquita Rector A wrote: Reason for CRM: Patient called in needing an appointment to get her medication filled but was not able to come in today until after 3 PM but nothing available after 1.20 and was not able to do virtual. Please advise  [phone]

## 2021-01-06 NOTE — Telephone Encounter (Signed)
Left message on patients phone that she can see Merita Norton on 01/08/21 at 3:00 p.m. for a mychart visit.

## 2021-01-06 NOTE — Telephone Encounter (Signed)
Left vm for pt to callback 

## 2021-01-08 ENCOUNTER — Other Ambulatory Visit: Payer: Self-pay | Admitting: Physician Assistant

## 2021-01-08 ENCOUNTER — Telehealth: Payer: Medicare Other | Admitting: Family Medicine

## 2021-01-08 DIAGNOSIS — E782 Mixed hyperlipidemia: Secondary | ICD-10-CM

## 2021-01-16 ENCOUNTER — Other Ambulatory Visit: Payer: Self-pay

## 2021-01-16 ENCOUNTER — Encounter: Payer: Self-pay | Admitting: Family Medicine

## 2021-01-16 ENCOUNTER — Telehealth (INDEPENDENT_AMBULATORY_CARE_PROVIDER_SITE_OTHER): Payer: Medicare Other | Admitting: Family Medicine

## 2021-01-16 DIAGNOSIS — Z91199 Patient's noncompliance with other medical treatment and regimen due to unspecified reason: Secondary | ICD-10-CM | POA: Insufficient documentation

## 2021-01-16 DIAGNOSIS — Z5329 Procedure and treatment not carried out because of patient's decision for other reasons: Secondary | ICD-10-CM

## 2021-01-16 NOTE — Progress Notes (Deleted)
MyChart Video Visit    Virtual Visit via Video Note   This visit type was conducted due to national recommendations for restrictions regarding the COVID-19 Pandemic (e.g. social distancing) in an effort to limit this patient's exposure and mitigate transmission in our community. This patient is at least at moderate risk for complications without adequate follow up. This format is felt to be most appropriate for this patient at this time. Physical exam was limited by quality of the video and audio technology used for the visit.   Patient location: *** Provider location: ***  I discussed the limitations of evaluation and management by telemedicine and the availability of in person appointments. The patient expressed understanding and agreed to proceed.  [name]   DOB: Sep 16, 1945   75 y.o. Female  MRN: [id number] Visit Date: 01/16/2021  Today's healthcare provider: Jacky Kindle, FNP   Chief Complaint  Patient presents with   Hypertension   Hyperlipidemia   Subjective    HPI  Hypertension, follow-up  BP Readings from Last 3 Encounters:  12/31/19 (!) 180/84  11/24/18 (!) 166/80  10/05/17 (!) 148/78   Wt Readings from Last 3 Encounters:  12/31/19 192 lb 3.2 oz (87.2 kg)  11/24/18 199 lb 9.6 oz (90.5 kg)  10/05/17 188 lb (85.3 kg)     She was last seen for hypertension 2 years ago.  BP at that visit was 166/80. Management since that visit includes none, paitent to continue Amlodipine and Ziac 10-6.25mg .  She reports good compliance with treatment. She is not having side effects.  She is following a Regular diet. She is not exercising. She does not smoke.  Use of agents associated with hypertension: none.   Outside blood pressures are not being checked.  Symptoms: No chest pain No chest pressure  No palpitations No syncope  No dyspnea No orthopnea  No paroxysmal nocturnal dyspnea No lower extremity edema   Pertinent labs: Lab Results   Component Value Date   CHOL 211 (H) 12/31/2019   HDL 50 12/31/2019   LDLCALC 136 (H) 12/31/2019   TRIG 141 12/31/2019   CHOLHDL 4.2 12/31/2019   Lab Results  Component Value Date   NA 139 12/31/2019   K 3.9 12/31/2019   CREATININE 0.82 12/31/2019   GFRNONAA 72 12/31/2019   GLUCOSE 100 (H) 12/31/2019     The ASCVD Risk score (Arnett DK, et al., 2019) failed to calculate for the following reasons:   The systolic blood pressure is missing   ---------------------------------------------------------------------------------------------------  Lipid/Cholesterol, Follow-up  Last lipid panel Other pertinent labs  Lab Results  Component Value Date   CHOL 211 (H) 12/31/2019   HDL 50 12/31/2019   LDLCALC 136 (H) 12/31/2019   TRIG 141 12/31/2019   CHOLHDL 4.2 12/31/2019   Lab Results  Component Value Date   ALT 15 12/31/2019   AST 17 12/31/2019   PLT 386 12/31/2019   TSH 1.770 12/31/2019     She was last seen for this 2 years ago.  Management since that visit includes none continue Simvastatin.  She reports good compliance with treatment. She is not having side effects.   Symptoms: No chest pain No chest pressure/discomfort  No dyspnea No lower extremity edema  No numbness or tingling of extremity No orthopnea  No palpitations No paroxysmal nocturnal dyspnea  No speech difficulty No syncope   Current diet: well balanced Current exercise: no regular exercise  The ASCVD Risk score (Arnett DK, et al., 2019)  failed to calculate for the following reasons:   The systolic blood pressure is missing  ---------------------------------------------------------------------------------------------------   Medications: Outpatient Medications Prior to Visit  Medication Sig   amLODipine (NORVASC) 5 MG tablet TAKE 1 TABLET BY MOUTH  DAILY   bisoprolol-hydrochlorothiazide (ZIAC) 10-6.25 MG tablet TAKE 1 TABLET BY MOUTH  DAILY   fluticasone (FLONASE) 50 MCG/ACT nasal spray Place 2  sprays into both nostrils daily.   simvastatin (ZOCOR) 10 MG tablet TAKE 1 TABLET BY MOUTH  DAILY AT 6 PM   Vitamin D, Cholecalciferol, 1000 units CAPS Take 1 capsule by mouth daily.   zolpidem (AMBIEN) 5 MG tablet TAKE 1 TABLET BY MOUTH AT  BEDTIME AS NEEDED   pseudoephedrine-acetaminophen (TYLENOL SINUS) 30-500 MG TABS tablet Take 1 tablet by mouth every 4 (four) hours as needed. (Patient not taking: Reported on 01/16/2021)   No facility-administered medications prior to visit.    Review of Systems  Constitutional:  Negative for activity change and fatigue.  Respiratory:  Negative for cough and shortness of breath.   Cardiovascular:  Negative for chest pain, palpitations and leg swelling.  Neurological:  Negative for dizziness and headaches.  Psychiatric/Behavioral:  Negative for self-injury, sleep disturbance and suicidal ideas. The patient is not nervous/anxious.    {Labs  Heme  Chem  Endocrine  Serology  Results Review (optional):23779}  Objective    There were no vitals taken for this visit. {Show previous vital signs (optional):23777}  Physical Exam     Assessment & Plan     ***  No follow-ups on file.     I discussed the assessment and treatment plan with the patient. The patient was provided an opportunity to ask questions and all were answered. The patient agreed with the plan and demonstrated an understanding of the instructions.   The patient was advised to call back or seek an in-person evaluation if the symptoms worsen or if the condition fails to improve as anticipated.  I provided *** minutes of non-face-to-face time during this encounter.  {provider attestation***:1}  Jacky Kindle, FNP [facility] [phone] (phone) [phone] (fax)  [facility]
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
## 2021-01-23 ENCOUNTER — Ambulatory Visit (INDEPENDENT_AMBULATORY_CARE_PROVIDER_SITE_OTHER): Payer: Medicare Other | Admitting: Family Medicine

## 2021-01-23 ENCOUNTER — Encounter: Payer: Self-pay | Admitting: Family Medicine

## 2021-01-23 ENCOUNTER — Other Ambulatory Visit: Payer: Self-pay

## 2021-01-23 VITALS — BP 178/84 | HR 64 | Temp 98.6°F | Resp 16 | Wt 186.2 lb

## 2021-01-23 DIAGNOSIS — E559 Vitamin D deficiency, unspecified: Secondary | ICD-10-CM

## 2021-01-23 DIAGNOSIS — E782 Mixed hyperlipidemia: Secondary | ICD-10-CM

## 2021-01-23 DIAGNOSIS — R739 Hyperglycemia, unspecified: Secondary | ICD-10-CM

## 2021-01-23 DIAGNOSIS — I10 Essential (primary) hypertension: Secondary | ICD-10-CM

## 2021-01-23 MED ORDER — VITAMIN D (ERGOCALCIFEROL) 1.25 MG (50000 UNIT) PO CAPS: 50000.0000 [IU] | ORAL_CAPSULE | ORAL | 3 refills | Status: DC

## 2021-01-23 MED ORDER — SIMVASTATIN 10 MG PO TABS: ORAL_TABLET | ORAL | 3 refills | Status: DC

## 2021-01-23 MED ORDER — BISOPROLOL-HYDROCHLOROTHIAZIDE 10-6.25 MG PO TABS: 1.0000 | ORAL_TABLET | Freq: Every day | ORAL | 3 refills | Status: DC

## 2021-01-23 MED ORDER — AMLODIPINE BESYLATE 10 MG PO TABS: 10.0000 mg | ORAL_TABLET | Freq: Every day | ORAL | 3 refills | Status: DC

## 2021-01-23 NOTE — Progress Notes (Signed)
Established patient visit   [name]   DOB: 05/03/1945   75 y.o. Female  MRN: [id number] Visit Date: 01/23/2021  Today's healthcare provider: Jacky Kindle, FNP   Chief Complaint  Patient presents with   Insomnia   Hypertension   Subjective    HPI  Follow up for insomnia:  The patient was last seen for this more than 1  year  ago. Changes made at last visit include none.  She reports excellent compliance with treatment. She feels that condition is Unchanged. She is not having side effects.   -----------------------------------------------------------------------------------------   Hypertension, follow-up  BP Readings from Last 3 Encounters:  01/23/21 (!) 178/84  12/31/19 (!) 180/84  11/24/18 (!) 166/80   Wt Readings from Last 3 Encounters:  01/23/21 186 lb 3.2 oz (84.5 kg)  12/31/19 192 lb 3.2 oz (87.2 kg)  11/24/18 199 lb 9.6 oz (90.5 kg)     She was last seen for hypertension 1  year  ago.  BP at that visit was 180/84. Management since that visit includes continuing Amlodipine 5mg , Ziac 10-6.25mg . Avoid products with sudafed. She will check BP at home and call if >140/90.  She reports excellent compliance with treatment. She is not having side effects.  She is following a Regular diet. She is exercising. She does not smoke.  Use of agents associated with hypertension: none.   Outside blood pressures are systolic 150-160s diastolic 70s. Symptoms: No chest pain No chest pressure  No palpitations No syncope  No dyspnea No orthopnea  No paroxysmal nocturnal dyspnea No lower extremity edema   Pertinent labs: Lab Results  Component Value Date   CHOL 211 (H) 12/31/2019   HDL 50 12/31/2019   LDLCALC 136 (H) 12/31/2019   TRIG 141 12/31/2019   CHOLHDL 4.2 12/31/2019   Lab Results  Component Value Date   NA 139 12/31/2019   K 3.9 12/31/2019   CREATININE 0.82 12/31/2019   GFRNONAA 72 12/31/2019   GLUCOSE 100 (H) 12/31/2019      The 10-year ASCVD risk score (Arnett DK, et al., 2019) is: 23.2%   ---------------------------------------------------------------------------------------------------   Lipid/Cholesterol, Follow-up  Last lipid panel Other pertinent labs  Lab Results  Component Value Date   CHOL 211 (H) 12/31/2019   HDL 50 12/31/2019   LDLCALC 136 (H) 12/31/2019   TRIG 141 12/31/2019   CHOLHDL 4.2 12/31/2019   Lab Results  Component Value Date   ALT 15 12/31/2019   AST 17 12/31/2019   PLT 386 12/31/2019   TSH 1.770 12/31/2019     She was last seen for this 1  year  ago.  Management since that visit includes continuing same medication.  She reports excellent compliance with treatment. She is not having side effects.   Symptoms: No chest pain No chest pressure/discomfort  No dyspnea No lower extremity edema  No numbness or tingling of extremity No orthopnea  No palpitations No paroxysmal nocturnal dyspnea  No speech difficulty No syncope   Current diet: well balanced Current exercise: walking  The 10-year ASCVD risk score (Arnett DK, et al., 2019) is: 23.2%  ---------------------------------------------------------------------------------------------------   Hyperglycemia, Follow-up  Lab Results  Component Value Date   HGBA1C 6.1 (H) 12/31/2019   HGBA1C 6.1 (H) 11/27/2018   HGBA1C 5.9 (H) 10/05/2017   GLUCOSE 100 (H) 12/31/2019   GLUCOSE 115 (H) 11/27/2018   GLUCOSE 102 (H) 10/05/2017    Last seen for for this1  year  ago.  Management since that visit includes continuing lifestyle modifications. Current symptoms include none and have been stable.  Prior visit with dietician: no Current diet: well balanced Current exercise: walking  Pertinent Labs:    Component Value Date/Time   CHOL 211 (H) 12/31/2019 1551   TRIG 141 12/31/2019 1551   CHOLHDL 4.2 12/31/2019 1551   CREATININE 0.82 12/31/2019 1551    Wt Readings from Last 3 Encounters:  01/23/21 186 lb 3.2 oz  (84.5 kg)  12/31/19 192 lb 3.2 oz (87.2 kg)  11/24/18 199 lb 9.6 oz (90.5 kg)    -----------------------------------------------------------------------------------------     Medications: Outpatient Medications Prior to Visit  Medication Sig   fluticasone (FLONASE) 50 MCG/ACT nasal spray Place 2 sprays into both nostrils daily.   Vitamin D, Cholecalciferol, 1000 units CAPS Take 1 capsule by mouth daily.   zolpidem (AMBIEN) 5 MG tablet TAKE 1 TABLET BY MOUTH AT  BEDTIME AS NEEDED   [DISCONTINUED] amLODipine (NORVASC) 5 MG tablet TAKE 1 TABLET BY MOUTH  DAILY   [DISCONTINUED] bisoprolol-hydrochlorothiazide (ZIAC) 10-6.25 MG tablet TAKE 1 TABLET BY MOUTH  DAILY   [DISCONTINUED] simvastatin (ZOCOR) 10 MG tablet TAKE 1 TABLET BY MOUTH  DAILY AT 6 PM   [DISCONTINUED] pseudoephedrine-acetaminophen (TYLENOL SINUS) 30-500 MG TABS tablet Take 1 tablet by mouth every 4 (four) hours as needed.   No facility-administered medications prior to visit.    Review of Systems     Objective    BP (!) 178/84   Pulse 64   Temp 98.6 F (37 C) (Oral)   Resp 16   Wt 186 lb 3.2 oz (84.5 kg)   BMI 34.06 kg/m    Physical Exam Vitals and nursing note reviewed.  Constitutional:      General: She is not in acute distress.    Appearance: Normal appearance. She is obese. She is not ill-appearing, toxic-appearing or diaphoretic.  HENT:     Head: Normocephalic and atraumatic.  Cardiovascular:     Rate and Rhythm: Normal rate and regular rhythm.     Pulses: Normal pulses.     Heart sounds: Normal heart sounds. No murmur heard.   No friction rub. No gallop.  Pulmonary:     Effort: Pulmonary effort is normal. No respiratory distress.     Breath sounds: Normal breath sounds. No stridor. No wheezing, rhonchi or rales.  Chest:     Chest wall: No tenderness.  Abdominal:     General: Bowel sounds are normal.     Palpations: Abdomen is soft.  Musculoskeletal:        General: No swelling, tenderness,  deformity or signs of injury. Normal range of motion.     Right lower leg: No edema.     Left lower leg: No edema.  Skin:    General: Skin is warm and dry.     Capillary Refill: Capillary refill takes less than 2 seconds.     Coloration: Skin is not jaundiced or pale.     Findings: No bruising, erythema, lesion or rash.  Neurological:     General: No focal deficit present.     Mental Status: She is alert and oriented to person, place, and time. Mental status is at baseline.     Cranial Nerves: No cranial nerve deficit.     Sensory: No sensory deficit.     Motor: No weakness.     Coordination: Coordination normal.  Psychiatric:        Mood and Affect: Mood normal.  Behavior: Behavior normal.        Thought Content: Thought content normal.        Judgment: Judgment normal.     No results found for any visits on 01/23/21.  Assessment & Plan     Problem List Items Addressed This Visit       Cardiovascular and Mediastinum   BP (high blood pressure)    elevated today Appears chronic, unstable Unable to check at home 'arms too big' Change in norvasc today- ok to double up on current supply; new rx called in Denies cp sob doe le edema      Relevant Medications   amLODipine (NORVASC) 10 MG tablet   bisoprolol-hydrochlorothiazide (ZIAC) 10-6.25 MG tablet   simvastatin (ZOCOR) 10 MG tablet     Other   Blood glucose elevated - Primary    Due for screening a1c      Relevant Orders   AMB Referral to [facility] Coordinaton   Combined fat and carbohydrate induced hyperlipemia    Pulled for statin refill recommend diet low in saturated fat and regular exercise - 30 min at least 5 times per week       Relevant Medications   amLODipine (NORVASC) 10 MG tablet   bisoprolol-hydrochlorothiazide (ZIAC) 10-6.25 MG tablet   simvastatin (ZOCOR) 10 MG tablet   Other Relevant Orders   [facility]B Referral to [facility]   Avitaminosis D    Plan to repeat lab work  at next visit new rx provided      Relevant Medications   Vitamin D, Ergocalciferol, (DRISDOL) 1.25 MG (50000 UNIT) CAPS capsule   Other Visit Diagnoses     Essential hypertension       Relevant Medications   amLODipine (NORVASC) 10 MG tablet   bisoprolol-hydrochlorothiazide (ZIAC) 10-6.25 MG tablet   simvastatin (ZOCOR) 10 MG tablet   Other Relevant Orders   AMB Referral to [facility] Coordinaton        Return in about 6 weeks (around 03/06/2021) for HTN management, chonic disease management.      Leilani Merl, FNP, have reviewed all documentation for this visit. The documentation on 01/23/21 for the exam, diagnosis, procedures, and orders are all accurate and complete.    Jacky Kindle, FNP  [facility] [phone] (phone) [phone] (fax)  [facility]

## 2021-01-23 NOTE — Assessment & Plan Note (Signed)
Plan to repeat lab work at next visit new rx provided

## 2021-01-23 NOTE — Assessment & Plan Note (Signed)
Due for screening a1c

## 2021-01-23 NOTE — Assessment & Plan Note (Signed)
Pulled for statin refill recommend diet low in saturated fat and regular exercise - 30 min at least 5 times per week

## 2021-01-23 NOTE — Assessment & Plan Note (Signed)
elevated today Appears chronic, unstable Unable to check at home 'arms too big' Change in norvasc today- ok to double up on current supply; new rx called in Denies cp sob doe le edema

## 2021-01-26 ENCOUNTER — Telehealth: Payer: Self-pay | Admitting: *Deleted

## 2021-01-26 NOTE — Chronic Care Management (AMB) (Signed)
  Chronic Care Management   Outreach Note  01/26/2021 Name: Marissa Hahn MRN: [id number] DOB: 1946-03-05  Marissa Hahn is a 75 y.o. year old female who is a primary care patient of Jacky Kindle, FNP. I reached out to Marissa Hahn by phone today in response to a referral sent by Ms. Naylee R Hilburn's primary care provider.  An unsuccessful telephone outreach was attempted today. The patient was referred to the case management team for assistance with care management and care coordination.   Follow Up Plan: A HIPAA compliant phone message was left for the patient providing contact information and requesting a return call.  If patient returns call to provider office, please advise to call Embedded Care Management Care Guide Ameris Akamine at [phone]  Burman Nieves, CCMA Care Guide, Embedded Care Coordination [facility]  Direct Dial: [phone]

## 2021-02-03 NOTE — Chronic Care Management (AMB) (Signed)
  Chronic Care Management   Outreach Note  02/03/2021 Name: Marissa Hahn MRN: [id number] DOB: 05-21-1945  Marissa Hahn is a 75 y.o. year old female who is a primary care patient of Jacky Kindle, FNP. I reached out to Marissa Hahn by phone today in response to a referral sent by Ms. Athleen R Mccluskey's primary care provider.  A second unsuccessful telephone outreach was attempted today. The patient was referred to the case management team for assistance with care management and care coordination.   Follow Up Plan: A HIPAA compliant phone message was left for the patient providing contact information and requesting a return call.  If patient returns call to provider office, please advise to call Embedded Care Management Care Guide Ahmed Inniss at [phone]  Burman Nieves, CCMA Care Guide, Embedded Care Coordination [facility]  Direct Dial: [phone]

## 2021-02-09 NOTE — Chronic Care Management (AMB) (Signed)
  Chronic Care Management   Outreach Note  02/09/2021 Name: MOON BUDDE MRN: [id number] DOB: 12-Sep-1945  [name] is a 75 y.o. year old female who is a primary care patient of Jacky Kindle, FNP. I reached out to [name] by phone today in response to a referral sent by Ms. Kindle R Tarnowski's primary care provider.  Third unsuccessful telephone outreach was attempted today. The patient was referred to the case management team for assistance with care management and care coordination. The patient's primary care provider has been notified of our unsuccessful attempts to make or maintain contact with the patient. The care management team is pleased to engage with this patient at any time in the future should he/she be interested in assistance from the care management team.   Follow Up Plan: We have been unable to make contact with the patient for follow up. The care management team is available to follow up with the patient after provider conversation with the patient regarding recommendation for care management engagement and subsequent re-referral to the care management team.   Burman Nieves, CCMA Care Guide, Embedded Care Coordination [facility]  Care Management  Direct Dial: [phone]

## 2021-02-18 NOTE — Chronic Care Management (AMB) (Signed)
  Chronic Care Management   Note  02/18/2021 Name: Marissa Hahn MRN: [id number] DOB: [birth year]  Marissa Hahn is a 75 y.o. year old female who is a primary care patient of Gwyneth Sprout, FNP. I reached out to Marissa Hahn by phone today in response to a referral sent by Ms. Garza PCP.  Marissa Hahn was given information about Chronic Care Management services today including:  CCM service includes personalized support from designated clinical staff supervised by her physician, including individualized plan of care and coordination with other care providers 24/7 contact phone numbers for assistance for urgent and routine care needs. Service will only be billed when office clinical staff spend 20 minutes or more in a month to coordinate care. Only one practitioner may furnish and bill the service in a calendar month. The patient may stop CCM services at any time (effective at the end of the month) by phone call to the office staff. The patient is responsible for co-pay (up to 20% after annual deductible is met) if co-pay is required by the individual health plan.   Patient agreed to services and verbal consent obtained.   Follow up plan: Telephone appointment with care management team member scheduled for: 03/02/2021  Julian Hy, [place] Management  Direct Dial: [phone]

## 2021-02-20 ENCOUNTER — Other Ambulatory Visit: Payer: Self-pay

## 2021-02-20 ENCOUNTER — Encounter: Payer: Self-pay | Admitting: Family Medicine

## 2021-02-20 ENCOUNTER — Ambulatory Visit (INDEPENDENT_AMBULATORY_CARE_PROVIDER_SITE_OTHER): Payer: Medicare Other | Admitting: Family Medicine

## 2021-02-20 VITALS — BP 184/77 | HR 70 | Ht 62.0 in | Wt 185.0 lb

## 2021-02-20 DIAGNOSIS — I10 Essential (primary) hypertension: Secondary | ICD-10-CM

## 2021-02-20 DIAGNOSIS — Z23 Encounter for immunization: Secondary | ICD-10-CM | POA: Diagnosis not present

## 2021-02-20 DIAGNOSIS — J301 Allergic rhinitis due to pollen: Secondary | ICD-10-CM

## 2021-02-20 MED ORDER — LOSARTAN POTASSIUM 100 MG PO TABS: 100.0000 mg | ORAL_TABLET | Freq: Every day | ORAL | 3 refills | Status: DC

## 2021-02-20 MED ORDER — FLUTICASONE PROPIONATE 50 MCG/ACT NA SUSP: 2.0000 | Freq: Every day | NASAL | 3 refills | Status: DC

## 2021-02-20 NOTE — Progress Notes (Signed)
Established patient visit   [name]   DOB: December 06, 1945   75 y.o. Female  MRN: [id number] Visit Date: 02/20/2021  Today's healthcare provider: Jacky Kindle, FNP   No chief complaint on file.  Subjective    HPI  Hypertension, follow-up  BP Readings from Last 3 Encounters:  02/20/21 (!) 184/77  01/23/21 (!) 178/84  12/31/19 (!) 180/84   Wt Readings from Last 3 Encounters:  02/20/21 185 lb (83.9 kg)  01/23/21 186 lb 3.2 oz (84.5 kg)  12/31/19 192 lb 3.2 oz (87.2 kg)     She was last seen for hypertension 1 months ago.  BP at that visit was 178/84. Management since that visit includes increased Amlodipine to 10mg .  She reports excellent compliance with treatment. She is not having side effects.  She is following a Regular diet. She is exercising. She does not smoke.  Use of agents associated with hypertension: none.   Outside blood pressures are 170s-180s/90s. Symptoms: No chest pain No chest pressure  No palpitations No syncope  No dyspnea No orthopnea  No paroxysmal nocturnal dyspnea No lower extremity edema   Pertinent labs: Lab Results  Component Value Date   CHOL 211 (H) 12/31/2019   HDL 50 12/31/2019   LDLCALC 136 (H) 12/31/2019   TRIG 141 12/31/2019   CHOLHDL 4.2 12/31/2019   Lab Results  Component Value Date   NA 139 12/31/2019   K 3.9 12/31/2019   CREATININE 0.82 12/31/2019   GFRNONAA 72 12/31/2019   GLUCOSE 100 (H) 12/31/2019   TSH 1.770 12/31/2019     The 10-year ASCVD risk score (Arnett DK, et al., 2019) is: 24.4%   ---------------------------------------------------------------------------------------------------     Medications: Outpatient Medications Prior to Visit  Medication Sig Note   amLODipine (NORVASC) 10 MG tablet Take 1 tablet (10 mg total) by mouth daily.    bisoprolol-hydrochlorothiazide (ZIAC) 10-6.25 MG tablet Take 1 tablet by mouth daily.    simvastatin (ZOCOR) 10 MG tablet TAKE 1 TABLET BY MOUTH   DAILY AT 6 PM    Vitamin D, Cholecalciferol, 1000 units CAPS Take 1 capsule by mouth daily.    Vitamin D, Ergocalciferol, (DRISDOL) 1.25 MG (50000 UNIT) CAPS capsule Take 1 capsule (50,000 Units total) by mouth every 7 (seven) days.    zolpidem (AMBIEN) 5 MG tablet TAKE 1 TABLET BY MOUTH AT  BEDTIME AS NEEDED    [DISCONTINUED] fluticasone (FLONASE) 50 MCG/ACT nasal spray Place 2 sprays into both nostrils daily. 02/20/2021: Patient would like a refill because it might help with allergies.   No facility-administered medications prior to visit.    Review of Systems     Objective    BP (!) 184/77   Pulse 70   Ht 5\' 2"  (1.575 m)   Wt 185 lb (83.9 kg)   SpO2 100%   BMI 33.84 kg/m    Physical Exam Vitals and nursing note reviewed.  Constitutional:      General: She is not in acute distress.    Appearance: Normal appearance. She is obese. She is not ill-appearing, toxic-appearing or diaphoretic.  HENT:     Head: Normocephalic and atraumatic.  Cardiovascular:     Rate and Rhythm: Normal rate and regular rhythm.     Pulses: Normal pulses.     Heart sounds: Normal heart sounds. No murmur heard.   No friction rub. No gallop.  Pulmonary:     Effort: Pulmonary effort is normal. No respiratory distress.  Breath sounds: Normal breath sounds. No stridor. No wheezing, rhonchi or rales.  Chest:     Chest wall: No tenderness.  Abdominal:     General: Bowel sounds are normal.     Palpations: Abdomen is soft.  Musculoskeletal:        General: No swelling, tenderness, deformity or signs of injury. Normal range of motion.     Right lower leg: No edema.     Left lower leg: No edema.  Skin:    General: Skin is warm and dry.     Capillary Refill: Capillary refill takes less than 2 seconds.     Coloration: Skin is not jaundiced or pale.     Findings: No bruising, erythema, lesion or rash.  Neurological:     General: No focal deficit present.     Mental Status: She is alert and oriented  to person, place, and time. Mental status is at baseline.     Cranial Nerves: No cranial nerve deficit.     Sensory: No sensory deficit.     Motor: No weakness.     Coordination: Coordination normal.  Psychiatric:        Mood and Affect: Mood normal.        Behavior: Behavior normal.        Thought Content: Thought content normal.        Judgment: Judgment normal.     No results found for any visits on 02/20/21.  Assessment & Plan     Problem List Items Addressed This Visit       Cardiovascular and Mediastinum   BP (high blood pressure)    Chronic, unstable Reports no missed medications Controlled diet, no meals out/excess sodium Denies SOB, DOE, no LE edema Home Bps in line with Bps in office; made aware of BP goal- advised start of 3rd agent Amb ref to cardiology for structural exam- to allow second opinion      Relevant Medications   losartan (COZAAR) 100 MG tablet     Respiratory   Allergic rhinitis    Request for refill of flonase given allergy symptoms      Relevant Medications   fluticasone (FLONASE) 50 MCG/ACT nasal spray     Other   Flu vaccine need    provided      Relevant Orders   Flu Vaccine QUAD High Dose(Fluad)   Other Visit Diagnoses     Essential hypertension    -  Primary   Relevant Medications   losartan (COZAAR) 100 MG tablet   Other Relevant Orders   Ambulatory referral to Cardiology        Return for blood pressure check, HTN management, nurse follow up.      Leilani Merl, FNP, have reviewed all documentation for this visit. The documentation on 02/20/21 for the exam, diagnosis, procedures, and orders are all accurate and complete.    Jacky Kindle, FNP  [facility] [phone] (phone) [phone] (fax)  [facility]

## 2021-02-20 NOTE — Assessment & Plan Note (Signed)
provided ?

## 2021-02-20 NOTE — Assessment & Plan Note (Signed)
Request for refill of flonase given allergy symptoms

## 2021-02-20 NOTE — Assessment & Plan Note (Signed)
Chronic, unstable Reports no missed medications Controlled diet, no meals out/excess sodium Denies SOB, DOE, no LE edema Home Bps in line with Bps in office; made aware of BP goal- advised start of 3rd agent Amb ref to cardiology for structural exam- to allow second opinion

## 2021-02-27 ENCOUNTER — Ambulatory Visit: Payer: Medicare Other | Admitting: Family Medicine

## 2021-03-02 ENCOUNTER — Telehealth: Payer: Self-pay

## 2021-03-02 ENCOUNTER — Telehealth: Payer: Medicare Other

## 2021-03-02 NOTE — Telephone Encounter (Signed)
  Care Management   Follow Up Note   03/02/2021 Name: Marissa Hahn MRN: [id number] DOB: 06-23-1945   Primary Care Provider: Jacky Kindle, FNP Reason for referral : Chronic Care Management   An unsuccessful telephone outreach was attempted today. The patient was referred to the case management team for assistance with care management and care coordination.    Follow Up Plan:  A HIPAA compliant voice message was left today requesting a return call.   [facility] Care Management [facility] [phone]

## 2021-03-06 ENCOUNTER — Telehealth: Payer: Self-pay | Admitting: *Deleted

## 2021-03-06 ENCOUNTER — Ambulatory Visit (INDEPENDENT_AMBULATORY_CARE_PROVIDER_SITE_OTHER): Payer: Medicare Other

## 2021-03-06 DIAGNOSIS — I10 Essential (primary) hypertension: Secondary | ICD-10-CM

## 2021-03-06 NOTE — Chronic Care Management (AMB) (Signed)
Chronic Care Management   CCM RN Visit Note  03/06/2021 Name: Marissa Hahn MRN: [id number] DOB: 01-19-1946  Subjective: Marissa Hahn is a 75 y.o. year old female who is a primary care patient of Gwyneth Sprout, FNP. The care management team was consulted for assistance with disease management and care coordination needs.    Engaged with patient by telephone for initial visit in response to provider referral for case management and care coordination services.   Consent to Services:  The patient was given the following information about Chronic Care Management services : 1. CCM service includes personalized support from designated clinical staff supervised by the primary care provider, including individualized plan of care and coordination with other care providers 2. 24/7 contact phone numbers for assistance for urgent and routine care needs. 3. Service will only be billed when office clinical staff spend 20 minutes or more in a month to coordinate care. 4. Only one practitioner may furnish and bill the service in a calendar month. 5.The patient may stop CCM services at any time (effective at the end of the month) by phone call to the office staff. 6. The patient will be responsible for cost sharing (co-pay) of up to 20% of the service fee (after annual deductible is met). Patient agreed to services and consent obtained.  Assessment: Review of patient past medical history, allergies, medications, health status, including review of consultants reports, laboratory and other test data, was performed as part of comprehensive evaluation and provision of chronic care management services.   SDOH (Social Determinants of Health) assessments and interventions performed:      CCM Care Plan  Allergies  Allergen Reactions   Acetaminophen-Codeine     Outpatient Encounter Medications as of 03/06/2021  Medication Sig   amLODipine (NORVASC) 10 MG tablet Take 1 tablet (10 mg total) by mouth  daily.   bisoprolol-hydrochlorothiazide (ZIAC) 10-6.25 MG tablet Take 1 tablet by mouth daily.   fluticasone (FLONASE) 50 MCG/ACT nasal spray Place 2 sprays into both nostrils daily.   losartan (COZAAR) 100 MG tablet Take 1 tablet (100 mg total) by mouth daily.   simvastatin (ZOCOR) 10 MG tablet TAKE 1 TABLET BY MOUTH  DAILY AT 6 PM   Vitamin D, Ergocalciferol, (DRISDOL) 1.25 MG (50000 UNIT) CAPS capsule Take 1 capsule (50,000 Units total) by mouth every 7 (seven) days.   zolpidem (AMBIEN) 5 MG tablet TAKE 1 TABLET BY MOUTH AT  BEDTIME AS NEEDED   Vitamin D, Cholecalciferol, 1000 units CAPS Take 1 capsule by mouth daily. (Patient not taking: Reported on 03/06/2021)   No facility-administered encounter medications on file as of 03/06/2021.    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   Interventions: 1:1 collaboration with primary care provider regarding development and update of comprehensive plan of care as evidenced by provider attestation and  co-signature Inter-disciplinary care team collaboration (see longitudinal plan of care) Evaluation of current treatment plan related to  self management and patient's adherence to plan as established by provider  Hypertension Interventions: Last practice recorded BP readings:  BP Readings from Last 3 Encounters:  02/20/21 (!) 184/77  01/23/21 (!) 178/84  12/31/19 (!) 180/84  Most recent eGFR/CrCl: No results found for: EGFR  No components found for: CRCL Reviewed medications.  Advised to continue taking as prescribed. Advised to update the care management with concerns regarding medication management or prescription cost.  Provided information regarding established blood pressure parameters along with indications for notifying a provider. Encouraged to monitor daily and record readings. Reports recent systolic readings have been elevated. Denies episodes of chest discomfort, palpitations, headaches or lightheadedness. Discussed compliance with recommended cardiac prudent diet. Encouraged to read nutrition labels monitor sodium levels and avoid highly processed foods when possible. Discussed complications of uncontrolled blood pressure. Discussed plan regarding Cardiology referral . The initial visit has not been scheduled. Advised to complete outreach as scheduled once contacted by the Cardiology team. Reviewed s/sx of heart attack, stroke and worsening symptoms that require immediate medical attention.  Hyperlipidemia Interventions: Lab Results  Component Value Date   CHOL 211 (H) 12/31/2019   HDL 50 12/31/2019   LDLCALC 136 (H) 12/31/2019   TRIG 141 12/31/2019   CHOLHDL 4.2 12/31/2019    Medication reviewed Reviewed provider established cholesterol goals  Discussed importance of completing regular laboratory monitoring as prescribed Discussed strategies to manage statin-induced myalgias Reviewed importance of limiting foods high in cholesterol Reviewed exercise goals and importance  of consistent activity   Patient Goals/Self-Care Activities: Patient will self administer medications as prescribed Patient will attend all scheduled provider appointments Patient will call pharmacy for medication refills Patient will continue to perform ADL's independently Patient will continue to perform IADL's independently Patient will call provider office for new concerns or questions    Follow Up Plan:   Will follow up next month        PLAN A member of the care management team will follow up next month.   [facility] Care Management [facility] [phone]

## 2021-03-06 NOTE — Chronic Care Management (AMB) (Signed)
  Care Management   Note  03/06/2021 Name: [name] MRN: [id number] DOB: 1945/12/31  Marissa Hahn is a 75 y.o. year old female who is a primary care patient of Jacky Kindle, FNP and is actively engaged with the care management team. I reached out to Marissa Hahn by phone today to assist with re-scheduling an initial visit with the RN Case Manager  Follow up plan: Unsuccessful telephone outreach attempt made. A HIPAA compliant phone message was left for the patient providing contact information and requesting a return call.   Burman Nieves, CCMA Care Guide, Embedded Care Coordination [facility]  Care Management  Direct Dial: [phone]

## 2021-03-09 NOTE — Patient Instructions (Signed)
Thank you for allowing the Chronic Care Management team to participate in your care.   Our next appointment is by telephone on 04/03/21 at 3:30pm.  Please call the care guide team at [phone] if you need to cancel or reschedule your appointment.    Following is a copy of your full care plan:  Care Plan : RN Care Management Plan of Care       Problem: HTN, HLD      Long-Range Goal: Disease Progression Prevented or Minimized   Start Date: 03/06/2021  Expected End Date: 06/04/2021  Priority: High  Note:   Current Barriers:  Chronic Disease Management support and education needs related to HTN and HLD  RNCM Clinical Goal(s):  Patient will demonstrate ongoing adherence to prescribed treatment plan for HTN and HLD through collaboration with the provider and care management team.   Interventions: 1:1 collaboration with primary care provider regarding development and update of comprehensive plan of care as evidenced by provider attestation and co-signature Inter-disciplinary care team collaboration (see longitudinal plan of care) Evaluation of current treatment plan related to  self management and patient's adherence to plan as established by provider  Hypertension Interventions: Last practice recorded BP readings:  BP Readings from Last 3 Encounters:  02/20/21 (!) 184/77  01/23/21 (!) 178/84  12/31/19 (!) 180/84  Most recent eGFR/CrCl: No results found for: EGFR  No components found for: CRCL Reviewed medications.  Advised to continue taking as prescribed. Advised to update the care management with concerns regarding medication management or prescription cost.  Provided information regarding established blood pressure parameters along with indications for notifying a provider. Encouraged to monitor daily and record readings. Reports recent systolic readings have been elevated. Denies episodes of chest discomfort, palpitations, headaches or lightheadedness. Discussed compliance  with recommended cardiac prudent diet. Encouraged to read nutrition labels monitor sodium levels and avoid highly processed foods when possible. Discussed complications of uncontrolled blood pressure. Discussed plan regarding Cardiology referral . The initial visit has not been scheduled. Advised to complete outreach as scheduled once contacted by the Cardiology team. Reviewed s/sx of heart attack, stroke and worsening symptoms that require immediate medical attention.  Hyperlipidemia Interventions: Lab Results  Component Value Date   CHOL 211 (H) 12/31/2019   HDL 50 12/31/2019   LDLCALC 136 (H) 12/31/2019   TRIG 141 12/31/2019   CHOLHDL 4.2 12/31/2019    Medication reviewed Reviewed provider established cholesterol goals  Discussed importance of completing regular laboratory monitoring as prescribed Discussed strategies to manage statin-induced myalgias Reviewed importance of limiting foods high in cholesterol Reviewed exercise goals and importance of consistent activity   Patient Goals/Self-Care Activities: Patient will self administer medications as prescribed Patient will attend all scheduled provider appointments Patient will call pharmacy for medication refills Patient will continue to perform ADL's independently Patient will continue to perform IADL's independently Patient will call provider office for new concerns or questions    Follow Up Plan:   Will follow up next month     Consent to CCM Services: Ms. Peaden was given information about Chronic Care Management services including:  CCM service includes personalized support from designated clinical staff supervised by her physician, including individualized plan of care and coordination with other care providers 24/7 contact phone numbers for assistance for urgent and routine care needs. Service will only be billed when office clinical staff spend 20 minutes or more in a month to coordinate care. Only one  practitioner may furnish and bill the service in a calendar  month. The patient may stop CCM services at any time (effective at the end of the month) by phone call to the office staff. The patient will be responsible for cost sharing (co-pay) of up to 20% of the service fee (after annual deductible is met).  Patient agreed to services and verbal consent obtained.   Ms. Nicklas verbalized understanding of the information discussed during the telephonic outreach. Declined need for mailed/printed instructions.   A member of the care management team will follow up next month.   [facility] Care Management [facility] [phone]

## 2021-03-10 NOTE — Progress Notes (Signed)
Pt schedule with RNCM on 04/05/2021 - scheduled by nurse case manager

## 2021-03-18 DIAGNOSIS — I10 Essential (primary) hypertension: Secondary | ICD-10-CM | POA: Diagnosis not present

## 2021-03-18 DIAGNOSIS — E785 Hyperlipidemia, unspecified: Secondary | ICD-10-CM | POA: Diagnosis not present

## 2021-04-03 ENCOUNTER — Telehealth: Payer: Medicare Other

## 2021-04-03 ENCOUNTER — Telehealth: Payer: Self-pay

## 2021-04-03 ENCOUNTER — Ambulatory Visit (INDEPENDENT_AMBULATORY_CARE_PROVIDER_SITE_OTHER): Payer: Medicare Other

## 2021-04-03 DIAGNOSIS — I10 Essential (primary) hypertension: Secondary | ICD-10-CM

## 2021-04-03 DIAGNOSIS — E782 Mixed hyperlipidemia: Secondary | ICD-10-CM

## 2021-04-03 NOTE — Patient Instructions (Signed)
Thank you for allowing the Chronic Care Management team to participate in your care.  

## 2021-04-03 NOTE — Chronic Care Management (AMB) (Signed)
Chronic Care Management   CCM RN Visit Note  04/03/2021 Name: Marissa Hahn MRN: [id number] DOB: [birth year]  Subjective: Marissa Hahn is a 75 y.o. year old female who is a primary care patient of Jacky Kindle, FNP. The care management team was consulted for assistance with disease management and care coordination needs.    Engaged with patient by telephone for follow up visit in response to provider referral for case management and care coordination services.   Consent to Services:  The patient was given information about Chronic Care Management services, agreed to services, and gave verbal consent prior to initiation of services.  Please see initial visit note for detailed documentation.    Assessment: Review of patient past medical history, allergies, medications, health status, including review of consultants reports, laboratory and other test data, was performed as part of comprehensive evaluation and provision of chronic care management services.   SDOH (Social Determinants of Health) assessments and interventions performed: No  CCM Care Plan  Allergies  Allergen Reactions   Acetaminophen-Codeine     Outpatient Encounter Medications as of 04/03/2021  Medication Sig   amLODipine (NORVASC) 10 MG tablet Take 1 tablet (10 mg total) by mouth daily.   bisoprolol-hydrochlorothiazide (ZIAC) 10-6.25 MG tablet Take 1 tablet by mouth daily.   fluticasone (FLONASE) 50 MCG/ACT nasal spray Place 2 sprays into both nostrils daily.   losartan (COZAAR) 100 MG tablet Take 1 tablet (100 mg total) by mouth daily.   simvastatin (ZOCOR) 10 MG tablet TAKE 1 TABLET BY MOUTH  DAILY AT 6 PM   Vitamin D, Cholecalciferol, 1000 units CAPS Take 1 capsule by mouth daily. (Patient not taking: Reported on 03/06/2021)   Vitamin D, Ergocalciferol, (DRISDOL) 1.25 MG (50000 UNIT) CAPS capsule Take 1 capsule (50,000 Units total) by mouth every 7 (seven) days.   zolpidem (AMBIEN) 5 MG tablet TAKE 1  TABLET BY MOUTH AT  BEDTIME AS NEEDED   No facility-administered encounter medications on file as of 04/03/2021.    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   Interventions: 1:1 collaboration with primary care provider regarding development and update of comprehensive plan of care as evidenced by provider attestation and co-signature Inter-disciplinary care team collaboration (see longitudinal plan of care) Evaluation of current treatment plan related to  self management and patient's adherence to plan as established by provider  Hypertension Interventions: BP Readings from Last 3 Encounters:  02/20/21 (!) 184/77  01/23/21 (!) 178/84  12/31/19 (!) 180/84    Reviewed medications and plan for hypertension management. Reports taking medications  as prescribed. Reviewed BP parameters. Encouraged to continue monitoring and recording readings.  Discussed outreach with the Cardiology team. Reports being unable to contact d/t her work schedule. Requested assistance with  scheduling outreach. Reports feeling well over the past month. No episodes of chest discomfort or palpitations.  Discussed nutritional intake and compliance with a heart healthy/cardiac prudent diet. Reports doing well with diet. Also notes her significant improvement with her activity tolerance. Advised to continue readings nutrition labels and avoiding highly processed foods when possible. Advised to continue low impact activities as tolerated. Reviewed s/sx of heart attack, stroke and worsening symptoms that require immediate medical attention.    Hyperlipidemia Interventions: Lab Results  Component Value Date   CHOL 211 (H) 12/31/2019   HDL 50 12/31/2019   LDLCALC 136 (H) 12/31/2019   TRIG 141 12/31/2019   CHOLHDL 4.2 12/31/2019    Medication reviewed Reviewed provider established cholesterol goals  Discussed importance of completing regular laboratory monitoring as prescribed Discussed strategies to manage statin-induced myalgias Reviewed importance of limiting foods high in cholesterol Reviewed exercise goals and importance of consistent activity   Patient Goals/Self-Care Activities: Patient will self administer medications as prescribed Patient will attend all scheduled provider appointments Patient will call pharmacy for medication refills Patient will continue to perform ADL's independently Patient will continue to perform IADL's independently Patient will call provider office for new concerns or questions    Follow Up Plan:   Will follow up next month      PLAN A member of the care management team will follow up with Marissa Hahn within the next month.   [facility] Care Management [facility] [phone]

## 2021-04-03 NOTE — Telephone Encounter (Signed)
°  Care Management   Follow Up Note   04/03/2021 Name: LOIS SLAGEL MRN: [id number] DOB: May 12, 1945   Primary Care Provider: Jacky Kindle, FNP Reason for referral : Chronic Care Management   An unsuccessful telephone outreach was attempted today. The patient was referred to the case management team for assistance with care management and care coordination.   Follow Up Plan:  A HIPAA compliant voice message was left today requesting a return call.   [facility] Care Management [facility] [phone]

## 2021-04-06 ENCOUNTER — Other Ambulatory Visit: Payer: Self-pay | Admitting: Family Medicine

## 2021-04-06 DIAGNOSIS — G4700 Insomnia, unspecified: Secondary | ICD-10-CM

## 2021-04-07 ENCOUNTER — Ambulatory Visit: Payer: Self-pay

## 2021-04-07 DIAGNOSIS — I10 Essential (primary) hypertension: Secondary | ICD-10-CM

## 2021-04-07 NOTE — Chronic Care Management (AMB) (Signed)
Chronic Care Management   CCM RN Visit Note  04/07/2021 Name: Marissa Hahn MRN: [id number] DOB: 11-03-1945  Subjective: Marissa Hahn is a 75 y.o. year old female who is a primary care patient of Jacky Kindle, FNP. The care management team was consulted for assistance with disease management and care coordination needs.    Care Coordination: Cardiology Referral Collaborated with the [facility] Care team to schedule Cardiology evaluation.  Consent to Services:  The patient was given information about Chronic Care Management services, agreed to services, and gave verbal consent prior to initiation of services.  Please see initial visit note for detailed documentation.   Assessment: Review of patient past medical history, allergies, medications, health status, including review of consultants reports, laboratory and other test data, was performed as part of comprehensive evaluation and provision of chronic care management services.   SDOH (Social Determinants of Health) assessments and interventions performed: No  CCM Care Plan  Allergies  Allergen Reactions   Acetaminophen-Codeine     Outpatient Encounter Medications as of 04/07/2021  Medication Sig   amLODipine (NORVASC) 10 MG tablet Take 1 tablet (10 mg total) by mouth daily.   bisoprolol-hydrochlorothiazide (ZIAC) 10-6.25 MG tablet Take 1 tablet by mouth daily.   fluticasone (FLONASE) 50 MCG/ACT nasal spray Place 2 sprays into both nostrils daily.   losartan (COZAAR) 100 MG tablet Take 1 tablet (100 mg total) by mouth daily.   simvastatin (ZOCOR) 10 MG tablet TAKE 1 TABLET BY MOUTH  DAILY AT 6 PM   Vitamin D, Cholecalciferol, 1000 units CAPS Take 1 capsule by mouth daily. (Patient not taking: Reported on 03/06/2021)   Vitamin D, Ergocalciferol, (DRISDOL) 1.25 MG (50000 UNIT) CAPS capsule Take 1 capsule (50,000 Units total) by mouth every 7 (seven) days.   zolpidem (AMBIEN) 5 MG tablet TAKE 1 TABLET BY MOUTH AT   BEDTIME AS NEEDED   No facility-administered encounter medications on file as of 04/07/2021.    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   Interventions: 1:1 collaboration with primary care provider regarding development and update of comprehensive plan of care as evidenced by provider attestation and co-signature Inter-disciplinary care team collaboration (see longitudinal plan of care) Evaluation of current treatment plan related to  self management and patient's adherence to plan as established by provider  Hypertension Interventions: BP Readings from Last 3 Encounters:  02/20/21 (!) 184/77  01/23/21 (!) 178/84  12/31/19 (!) 180/84    Reviewed medications and plan for hypertension management. Reports taking medications as prescribed. Reviewed BP parameters. Encouraged to continue  monitoring and recording readings.  Discussed outreach with the Cardiology team. Reports being unable to contact d/t her work schedule. Requested assistance with scheduling outreach.  Reports feeling well over the past month. No episodes of chest discomfort or palpitations.  Discussed nutritional intake and compliance with a heart healthy/cardiac prudent diet. Reports doing well with diet. Also notes her significant improvement with her activity tolerance. Advised to continue readings nutrition labels and avoiding highly processed foods when possible. Advised to continue low impact activities as tolerated. Reviewed s/sx of heart attack, stroke and worsening symptoms that require immediate medical attention. Update on 04/07/21: Collaborated with the [facility] staff. Evaluation scheduled for 05/15/21.     Hyperlipidemia Interventions: Lab Results  Component Value Date   CHOL 211 (H) 12/31/2019   HDL 50 12/31/2019   LDLCALC 136 (H) 12/31/2019   TRIG 141 12/31/2019   CHOLHDL 4.2 12/31/2019    Medication reviewed Reviewed provider established cholesterol goals  Discussed importance of completing regular laboratory monitoring as prescribed Discussed strategies to manage statin-induced myalgias Reviewed importance of limiting foods high in cholesterol Reviewed exercise goals and importance of consistent activity   Patient Goals/Self-Care Activities: Patient will self administer medications as prescribed Patient will attend all scheduled provider appointments Patient will call pharmacy for medication refills Patient will continue to perform ADL's independently Patient will continue to perform IADL's independently Patient will call provider office for new concerns or questions    Follow Up Plan:   Will follow up next month      PLAN A member of the care management team will follow up within the next month.   [facility] Care Management [facility] [phone]

## 2021-04-07 NOTE — Telephone Encounter (Signed)
Requested medication (s) are due for refill today: yes  Requested medication (s) are on the active medication list: yes  Last refill:  01/12/21  Future visit scheduled: no  Notes to clinic:  pt canceled appt 3 days after last refill, do not see this med/dx addressed in OV note, please assess.  Requested Prescriptions  Pending Prescriptions Disp Refills   zolpidem (AMBIEN) 5 MG tablet [Pharmacy Med Name: Zolpidem Tartrate 5 MG Oral Tablet] 90 tablet     Sig: TAKE 1 TABLET BY MOUTH AT  BEDTIME AS NEEDED     Not Delegated - Psychiatry:  Anxiolytics/Hypnotics Failed - 04/06/2021  6:35 PM      Failed - This refill cannot be delegated      Failed - Urine Drug Screen completed in last 360 days      Passed - Valid encounter within last 6 months    Recent Outpatient Visits           1 month ago Essential hypertension   [facility] Merita Norton T, FNP   2 months ago Blood glucose elevated   [facility] Jacky Kindle, FNP   2 months ago No-show for appointment   [facility] Jacky Kindle, FNP   1 year ago Annual physical exam   [facility], Alessandra [place], [place]   2 years ago Annual physical exam   [facility] [place], [place]andra [place], [place]       Future Appointments             In 1 month Gollan, Tollie Pizza, MD [facility]

## 2021-04-17 ENCOUNTER — Ambulatory Visit: Payer: Self-pay

## 2021-04-17 DIAGNOSIS — I10 Essential (primary) hypertension: Secondary | ICD-10-CM

## 2021-04-17 DIAGNOSIS — R0981 Nasal congestion: Secondary | ICD-10-CM

## 2021-04-17 NOTE — Chronic Care Management (AMB) (Signed)
Chronic Care Management   CCM RN Visit Note  04/17/2021 Name: Marissa Hahn MRN: [id number] DOB: [birth year]  Subjective: Marissa Hahn is a 75 y.o. year old female who is a primary care patient of Jacky Kindle, FNP. The care management team was consulted for assistance with disease management and care coordination needs.    Engaged with patient by telephone for follow up visit in response to provider referral for case management and care coordination services.   Consent to Services:  The patient was given information about Chronic Care Management services, agreed to services, and gave verbal consent prior to initiation of services.  Please see initial visit note for detailed documentation.    Assessment: Review of patient past medical history, allergies, medications, health status, including review of consultants reports, laboratory and other test data, was performed as part of comprehensive evaluation and provision of chronic care management services.   SDOH (Social Determinants of Health) assessments and interventions performed:  No  CCM Care Plan  Allergies  Allergen Reactions   Acetaminophen-Codeine     Outpatient Encounter Medications as of 04/17/2021  Medication Sig   amLODipine (NORVASC) 10 MG tablet Take 1 tablet (10 mg total) by mouth daily.   bisoprolol-hydrochlorothiazide (ZIAC) 10-6.25 MG tablet Take 1 tablet by mouth daily.   fluticasone (FLONASE) 50 MCG/ACT nasal spray Place 2 sprays into both nostrils daily.   losartan (COZAAR) 100 MG tablet Take 1 tablet (100 mg total) by mouth daily.   simvastatin (ZOCOR) 10 MG tablet TAKE 1 TABLET BY MOUTH  DAILY AT 6 PM   Vitamin D, Cholecalciferol, 1000 units CAPS Take 1 capsule by mouth daily. (Patient not taking: Reported on 03/06/2021)   Vitamin D, Ergocalciferol, (DRISDOL) 1.25 MG (50000 UNIT) CAPS capsule Take 1 capsule (50,000 Units total) by mouth every 7 (seven) days.   zolpidem (AMBIEN) 5 MG tablet TAKE 1  TABLET BY MOUTH AT  BEDTIME AS NEEDED   No facility-administered encounter medications on file as of 04/17/2021.    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   Interventions: 1:1 collaboration with primary care provider regarding development and update of comprehensive plan of care as evidenced by provider attestation and co-signature Inter-disciplinary care team collaboration (see longitudinal plan of care) Evaluation of current treatment plan related to  self management and patient's adherence to plan as established by provider  Hypertension Interventions: Reviewed medications and plan for hypertension management. Reports taking medications as prescribed. Reviewed BP parameters. Encouraged to continue monitoring and recording readings.  Reviewed plan for Cardiology evaluation. Advised to follow  up as scheduled on 05/15/21.   Reviewed nutritional intake and compliance with a heart healthy/cardiac prudent diet. Advised to continue readings nutrition labels and avoiding  highly processed foods when possible. Advised to continue low impact activities as tolerated. Reviewed s/sx of heart attack, stroke and worsening symptoms that require immediate medical attention.   Hyperlipidemia Interventions:  (On Track: No New Interventions during this encounter) Medication reviewed Reviewed provider established cholesterol goals  Discussed importance of completing regular laboratory monitoring as prescribed Discussed strategies to manage statin-induced myalgias Reviewed importance of limiting foods high in cholesterol Reviewed exercise goals and importance of consistent activity  Sinus Congestion: (New Goal) Discussed symptoms related to acute sinus and chest congestion. Reports symptoms started over a week ago and have lingered. Reports initially experiencing congestion and a sore throat. Sore throat has resolved but she continues to experience a productive cough and congestion. Describes sputum as thick and clear. Denies episodes of shortness of breath. She has been afebrile. She is currently taking over the counter cold medicines and vitamin C supplements.  Maintaining adequate hydration. Reports avoiding decongestants that elevate BP. Declined current need for evaluation with a provider. She prefers to monitor her symptoms over the weekend. Agreed to contact the clinic if symptoms do not resolve. Thoroughly discussed symptoms and indications for seeking immediate medical attention.  Patient Goals/Self-Care Activities: Patient will self administer medications as prescribed Patient will attend all scheduled provider appointments Patient will call pharmacy for medication refills Patient will continue to perform ADL's independently Patient will continue to perform IADL's independently Patient will call provider office for new concerns or questions    Follow Up Plan:   Will follow up next month      PLAN A member of the care management team will follow up within  the next month.   [facility] Care Management [facility] [phone]

## 2021-04-17 NOTE — Patient Instructions (Signed)
Thank you for allowing the Chronic Care Management team to participate in your care. It was great speaking with you today! °

## 2021-04-18 DIAGNOSIS — E782 Mixed hyperlipidemia: Secondary | ICD-10-CM | POA: Diagnosis not present

## 2021-04-18 DIAGNOSIS — I10 Essential (primary) hypertension: Secondary | ICD-10-CM | POA: Diagnosis not present

## 2021-04-24 ENCOUNTER — Ambulatory Visit (INDEPENDENT_AMBULATORY_CARE_PROVIDER_SITE_OTHER): Payer: No Typology Code available for payment source

## 2021-04-24 DIAGNOSIS — R0981 Nasal congestion: Secondary | ICD-10-CM

## 2021-04-24 DIAGNOSIS — I10 Essential (primary) hypertension: Secondary | ICD-10-CM

## 2021-04-24 NOTE — Chronic Care Management (AMB) (Signed)
Chronic Care Management   CCM RN Visit Note  04/24/2021 Name: Marissa Hahn MRN: [id number] DOB: Nov 03, 1945  Subjective: Marissa Hahn is a 76 y.o. year old female who is a primary care patient of Gwyneth Sprout, FNP. The care management team was consulted for assistance with disease management and care coordination needs.    Engaged with patient by telephone for follow up visit in response to provider referral for case management and care coordination services.   Consent to Services:  The patient was given information about Chronic Care Management services, agreed to services, and gave verbal consent prior to initiation of services.  Please see initial visit note for detailed documentation.   Assessment: Review of patient past medical history, allergies, medications, health status, including review of consultants reports, laboratory and other test data, was performed as part of comprehensive evaluation and provision of chronic care management services.   SDOH (Social Determinants of Health) assessments and interventions performed: No  CCM Care Plan  Allergies  Allergen Reactions   Acetaminophen-Codeine     Outpatient Encounter Medications as of 04/24/2021  Medication Sig   amLODipine (NORVASC) 10 MG tablet Take 1 tablet (10 mg total) by mouth daily.   bisoprolol-hydrochlorothiazide (ZIAC) 10-6.25 MG tablet Take 1 tablet by mouth daily.   fluticasone (FLONASE) 50 MCG/ACT nasal spray Place 2 sprays into both nostrils daily.   losartan (COZAAR) 100 MG tablet Take 1 tablet (100 mg total) by mouth daily.   simvastatin (ZOCOR) 10 MG tablet TAKE 1 TABLET BY MOUTH  DAILY AT 6 PM   Vitamin D, Cholecalciferol, 1000 units CAPS Take 1 capsule by mouth daily. (Patient not taking: Reported on 03/06/2021)   Vitamin D, Ergocalciferol, (DRISDOL) 1.25 MG (50000 UNIT) CAPS capsule Take 1 capsule (50,000 Units total) by mouth every 7 (seven) days.   zolpidem (AMBIEN) 5 MG tablet TAKE 1 TABLET BY  MOUTH AT  BEDTIME AS NEEDED   No facility-administered encounter medications on file as of 04/24/2021.    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   Interventions: 1:1 collaboration with primary care provider regarding development and update of comprehensive plan of care as evidenced by provider attestation and co-signature Inter-disciplinary care team collaboration (see longitudinal plan of care) Evaluation of current treatment plan related to  self management and patient's adherence to plan as established by provider  Hypertension Interventions: Reviewed medications and plan for hypertension management. Reports taking medications as prescribed. Reviewed BP parameters. Encouraged to continue monitoring and recording readings.  Reviewed plan for Cardiology evaluation. Advised to follow up as  scheduled on 05/15/21.   Reviewed nutritional intake and compliance with a heart healthy/cardiac prudent diet. Advised to continue readings nutrition labels and avoiding highly  processed foods when possible. Advised to continue low impact activities as tolerated. Reviewed s/sx of heart attack, stroke and worsening symptoms that require immediate medical attention.   Hyperlipidemia Interventions:  (On Track: No New Interventions during this encounter) Medication reviewed Reviewed provider established cholesterol goals  Discussed importance of completing regular laboratory monitoring as prescribed Discussed strategies to manage statin-induced myalgias Reviewed importance of limiting foods high in cholesterol Reviewed exercise goals and importance of consistent activity  Sinus Congestion: Discussed symptoms related to acute sinus and chest congestion. Reports symptoms started over a week ago and have lingered. Reports initially experiencing congestion and a sore throat. Sore throat has resolved but she continues to experience a productive cough and congestion. Describes sputum as thick and clear. Denies episodes of shortness of breath. She has been afebrile. She is currently taking over the counter cold medicines and vitamin C supplements.  Maintaining adequate hydration. Reports avoiding decongestants that elevate BP. Declined current need for evaluation with a provider. She prefers to monitor her symptoms over the weekend. Agreed to contact the clinic if symptoms do not resolve. Thoroughly discussed symptoms and indications for seeking immediate medical attention. Update 04/24/21: Report cough and congestion have improved. Reports minimal facial swelling and redness to her eyes which resolved with compresses and eye drops. Overall reports feeling significantly better today. Advised to follow up with the Optometrist as planned to discuss concerns r/t dry eyes. Reviewed s/sx that require immediate medical attention.  Patient Goals/Self-Care Activities: Patient will self administer medications as prescribed Patient will attend all scheduled provider appointments Patient  will call pharmacy for medication refills Patient will continue to perform ADL's independently Patient will continue to perform IADL's independently Patient will call provider office for new concerns or questions    Follow Up Plan:   Will follow up next month      PLAN: A member of the care management team will follow up next month.   C[facility] Care Management [facility] [phone]

## 2021-05-15 ENCOUNTER — Ambulatory Visit (INDEPENDENT_AMBULATORY_CARE_PROVIDER_SITE_OTHER): Payer: No Typology Code available for payment source | Admitting: Cardiovascular Disease

## 2021-05-15 ENCOUNTER — Other Ambulatory Visit: Payer: Self-pay

## 2021-05-15 ENCOUNTER — Encounter: Payer: Self-pay | Admitting: Cardiovascular Disease

## 2021-05-15 VITALS — BP 160/82 | HR 74 | Ht 62.0 in | Wt 185.4 lb

## 2021-05-15 DIAGNOSIS — E782 Mixed hyperlipidemia: Secondary | ICD-10-CM | POA: Diagnosis not present

## 2021-05-15 DIAGNOSIS — I10 Essential (primary) hypertension: Secondary | ICD-10-CM

## 2021-05-15 DIAGNOSIS — R7303 Prediabetes: Secondary | ICD-10-CM

## 2021-05-15 MED ORDER — HYDROCHLOROTHIAZIDE 25 MG PO TABS: 25.0000 mg | ORAL_TABLET | Freq: Every day | ORAL | 3 refills | Status: DC

## 2021-05-15 MED ORDER — BISOPROLOL FUMARATE 10 MG PO TABS: 10.0000 mg | ORAL_TABLET | Freq: Every day | ORAL | 3 refills | Status: DC

## 2021-05-15 NOTE — Patient Instructions (Addendum)
Medication Instructions:  - Your physician has recommended you make the following change in your medication:   1) STOP bisoprolol- HCTZ combo pill  2) START bisoprolol 10 mg: - take 1 tablet by mouth once a day  3) START HCTZ 25 mg: - take 1 tablet by mouth once a day  - Monitor blood pressure at home - Call with numbers  - Try 2 blood pressure meds in the Am and two in the PM  If you need a refill on your cardiac medications before your next appointment, please call your pharmacy.   Lab work: No new labs needed  Testing/Procedures: No new testing needed  Follow-Up: At [facility], you and your health needs are our priority.  As part of our continuing mission to provide you with exceptional heart care, we have created designated Provider Care Teams.  These Care Teams include your primary Cardiologist (physician) and Advanced Practice Providers (APPs -  Physician Assistants and Nurse Practitioners) who all work together to provide you with the care you need, when you need it.  You will need a follow up appointment as needed  Providers on your designated Care Team:   Nicolasa Ducking, NP Eula Listen, PA-C Cadence Fransico Michael, [place]  COVID-19 Vaccine Information can be found at: [email or web address] For questions related to vaccine distribution or appointments, please email [email or web address] or call [phone].

## 2021-05-15 NOTE — Progress Notes (Signed)
Cardiology Office Note  Date:  05/16/2021   ID:  TAL NEER, DOB 1946/01/08, MRN [id number]  PCP:  Jacky Kindle, FNP   Chief Complaint  Patient presents with   New Patient (Initial Visit)    Referred by Merita Norton for consultation of her hypertension. Medications reviewed by the patient verbally.       HPI:  [name] is a 76 year old woman with past medical history of Hypertension Elevated glucose A1c 6.1  hyperlipidemia Referred by Robynn Pane payne for consultation of her hypertension  On evaluation today, reports that she feels well Having difficulty controlling her blood pressure Currently taking amlodipine 10 bisoprolol HCTZ 10/6.25 daily losartan 100 daily Losartan and amlodipine added in the past several months She does report some improvement in numbers with recent additions but still running little bit high  Discussed blood pressure measurements, does not have a blood pressure cuff at home Sometimes checks her blood pressure at work Typically will run 154/76 Has been running high at doctor visits  Mom had HTN, required medications  No prior cardiac work-up, No regular exercise program but does stay active Reports a little bit of lower extremity edema, nothing significant Eats out periodically, denies high salt use  EKG personally reviewed by myself on todays visit Normal sinus rhythm rate 74 bpm no significant ST-T wave changes   PMH:   has a past medical history of Hyperlipidemia and Hypertension.  PSH:    Past Surgical History:  Procedure Laterality Date   ABDOMINAL HYSTERECTOMY  1990   Abdominal   CARPAL TUNNEL RELEASE Left 01/2010    Current Outpatient Medications  Medication Sig Dispense Refill   amLODipine (NORVASC) 10 MG tablet Take 1 tablet (10 mg total) by mouth daily. 90 tablet 3   bisoprolol (ZEBETA) 10 MG tablet Take 1 tablet (10 mg total) by mouth daily. 90 tablet 3   fluticasone (FLONASE) 50 MCG/ACT nasal spray Place 2  sprays into both nostrils daily. 48 g 3   hydrochlorothiazide (HYDRODIURIL) 25 MG tablet Take 1 tablet (25 mg total) by mouth daily. 90 tablet 3   losartan (COZAAR) 100 MG tablet Take 1 tablet (100 mg total) by mouth daily. 90 tablet 3   simvastatin (ZOCOR) 10 MG tablet TAKE 1 TABLET BY MOUTH  DAILY AT 6 PM 90 tablet 3   Vitamin D, Ergocalciferol, (DRISDOL) 1.25 MG (50000 UNIT) CAPS capsule Take 1 capsule (50,000 Units total) by mouth every 7 (seven) days. 15 capsule 3   zolpidem (AMBIEN) 5 MG tablet TAKE 1 TABLET BY MOUTH AT  BEDTIME AS NEEDED 90 tablet 0   Vitamin D, Cholecalciferol, 1000 units CAPS Take 1 capsule by mouth daily. (Patient not taking: Reported on 03/06/2021)     No current facility-administered medications for this visit.     Allergies:   Acetaminophen-codeine   Social History:  The patient  reports that she has never smoked. She has never used smokeless tobacco. She reports that she does not currently use alcohol. She reports that she does not use drugs.   Family History:   family history includes Healthy in an other family member; Hypertension in her mother, sister, sister, and sister; Lung cancer in her father.    Review of Systems: Review of Systems  Constitutional: Negative.   HENT: Negative.    Respiratory: Negative.    Cardiovascular: Negative.   Gastrointestinal: Negative.   Musculoskeletal: Negative.   Neurological: Negative.   Psychiatric/Behavioral: Negative.    All other systems reviewed  and are negative.   PHYSICAL EXAM: VS:  BP (!) 160/82 (BP Location: Right Arm, Patient Position: Sitting, Cuff Size: Large)    Pulse 74    Ht 5\' 2"  (1.575 m)    Wt 185 lb 6 oz (84.1 kg)    SpO2 98%    BMI 33.91 kg/m  , BMI Body mass index is 33.91 kg/m. GEN: Well nourished, well developed, in no acute distress HEENT: normal Neck: no JVD, carotid bruits, or masses Cardiac: RRR; no murmurs, rubs, or gallops,no edema  Respiratory:  clear to auscultation bilaterally,  normal work of breathing GI: soft, nontender, nondistended, + BS MS: no deformity or atrophy Skin: warm and dry, no rash Neuro:  Strength and sensation are intact Psych: euthymic mood, full affect    Recent Labs: No results found for requested labs within last 8760 hours.    Lipid Panel Lab Results  Component Value Date   CHOL 211 (H) 12/31/2019   HDL 50 12/31/2019   LDLCALC 136 (H) 12/31/2019   TRIG 141 12/31/2019      Wt Readings from Last 3 Encounters:  05/15/21 185 lb 6 oz (84.1 kg)  02/20/21 185 lb (83.9 kg)  01/23/21 186 lb 3.2 oz (84.5 kg)       ASSESSMENT AND PLAN:  Problem List Items Addressed This Visit       Cardiology Problems   BP (high blood pressure) - Primary   Relevant Medications   bisoprolol (ZEBETA) 10 MG tablet   hydrochlorothiazide (HYDRODIURIL) 25 MG tablet   Other Relevant Orders   EKG 12-Lead   Essential hypertension Reports strong family history, mother with hypertension requiring medications Denies excessive salt use Weight stable over the past year or 2 Recommend she buy blood pressure cuff for home checks rather than just office and doctor visits Suggested she split the Ziac and to bisoprolol 10, new prescription for HCTZ 25 daily No recent BMP since September 2021 We will arrange follow-up BMP Suggested she call October 2021 with blood pressure measurements Additional options for blood pressure control include changing bisoprolol to carvedilol, changing losartan to another agent such as Benicar, could even substitute out the beta-blocker for more aggressive agent such as clonidine.  Other agents such as hydralazine, long-acting nitrates could be entertained -We will hold off on renal and aldosterone lab work, renal artery Dopplers at this time but could be considered in follow-up  Elevated glucose September 2021 A1c 6.1 Weight per the notes actually down several pounds since 2021 Will defer routine lab work to primary care on annual  visit  Hyperlipidemia Jump in numbers on lab work 2021, perhaps off simvastatin at that time Recommend she follow-up with primary care for annual lab work, last in 2021  Preventive care Could consider CT coronary calcium scoring in follow-up   Total encounter time more than 60 minutes  Greater than 50% was spent in counseling and coordination of care with the patient    Signed, 2022, M.D., Ph.D. [facility] [place], [place]

## 2021-05-18 ENCOUNTER — Telehealth: Payer: Self-pay

## 2021-05-18 ENCOUNTER — Other Ambulatory Visit: Payer: Self-pay

## 2021-05-18 DIAGNOSIS — Z13220 Encounter for screening for lipoid disorders: Secondary | ICD-10-CM

## 2021-05-18 DIAGNOSIS — I10 Essential (primary) hypertension: Secondary | ICD-10-CM

## 2021-05-18 DIAGNOSIS — R739 Hyperglycemia, unspecified: Secondary | ICD-10-CM

## 2021-05-18 NOTE — Progress Notes (Signed)
Secure message from Dr. Trinna Post on HCTZ on office visit January 27  Can we arrange for BMP, A1c, lipids at her convenience in the next several weeks  She also needs blood pressure cuff at home, needs to call us with some blood pressure measurements on full HCTZ 25  Thx  TG

## 2021-05-18 NOTE — Telephone Encounter (Signed)
Attempted to schedule.  LMOV to call office.  ° °

## 2021-05-18 NOTE — Telephone Encounter (Signed)
-----   Message from Wynema Birch, RN sent at 05/18/2021 10:24 AM EST ----- Hey ladies Can we reach out to Marissa Hahn to have her come in the next several weeks at her convenience to have labs drawn for starting new medication at [place] 1/27.  Thanks Mandie ----- Message ----- From: Minna Merritts, MD Sent: 05/16/2021   9:47 AM EST To: Wynema Birch, RN  Started on HCTZ on office visit January 27 Can we arrange for BMP, A1c, lipids at her convenience in the next several weeks She also needs blood pressure cuff at home, needs to call us with some blood pressure measurements on full HCTZ 25 Thx TG

## 2021-05-19 DIAGNOSIS — E785 Hyperlipidemia, unspecified: Secondary | ICD-10-CM

## 2021-05-19 DIAGNOSIS — I10 Essential (primary) hypertension: Secondary | ICD-10-CM

## 2021-06-08 ENCOUNTER — Other Ambulatory Visit: Payer: Self-pay | Admitting: Family Medicine

## 2021-06-08 DIAGNOSIS — J301 Allergic rhinitis due to pollen: Secondary | ICD-10-CM

## 2021-06-08 DIAGNOSIS — E782 Mixed hyperlipidemia: Secondary | ICD-10-CM

## 2021-06-08 DIAGNOSIS — E559 Vitamin D deficiency, unspecified: Secondary | ICD-10-CM

## 2021-06-08 DIAGNOSIS — I10 Essential (primary) hypertension: Secondary | ICD-10-CM

## 2021-06-08 DIAGNOSIS — G4700 Insomnia, unspecified: Secondary | ICD-10-CM

## 2021-06-08 MED ORDER — LOSARTAN POTASSIUM 100 MG PO TABS: 100.0000 mg | ORAL_TABLET | Freq: Every day | ORAL | 0 refills | Status: DC

## 2021-06-08 MED ORDER — AMLODIPINE BESYLATE 10 MG PO TABS: 10.0000 mg | ORAL_TABLET | Freq: Every day | ORAL | 1 refills | Status: DC

## 2021-06-08 NOTE — Telephone Encounter (Signed)
Requested Prescriptions  Pending Prescriptions Disp Refills   losartan (COZAAR) 100 MG tablet 90 tablet 0    Sig: Take 1 tablet (100 mg total) by mouth daily.     Cardiovascular:  Angiotensin Receptor Blockers Failed - 06/08/2021  1:04 PM      Failed - Cr in normal range and within 180 days    Creatinine, Ser  Date Value Ref Range Status  12/31/2019 0.82 0.57 - 1.00 mg/dL Final         Failed - K in normal range and within 180 days    Potassium  Date Value Ref Range Status  12/31/2019 3.9 3.5 - 5.2 mmol/L Final         Failed - Last BP in normal range    BP Readings from Last 1 Encounters:  05/15/21 (!) 160/82         Passed - Patient is not pregnant      Passed - Valid encounter within last 6 months    Recent Outpatient Visits          3 months ago Essential hypertension   [facility] Merita Norton T, FNP   4 months ago Blood glucose elevated   [facility] Jacky Kindle, FNP   4 months ago No-show for appointment   [facility] Jacky Kindle, FNP   1 year ago Annual physical exam   [facility] Margaretann Loveless, [place]   2 years ago Annual physical exam   [facility] [name] M, PA-C              amLODipine (NORVASC) 10 MG tablet 90 tablet 3    Sig: Take 1 tablet (10 mg total) by mouth daily.     Cardiovascular: Calcium Channel Blockers 2 Failed - 06/08/2021  1:04 PM      Failed - Last BP in normal range    BP Readings from Last 1 Encounters:  05/15/21 (!) 160/82         Passed - Last Heart Rate in normal range    Pulse Readings from Last 1 Encounters:  05/15/21 74         Passed - Valid encounter within last 6 months    Recent Outpatient Visits          3 months ago Essential hypertension   [facility] Merita Norton T, FNP   4 months ago Blood glucose elevated   [facility] Jacky Kindle, FNP   4 months ago No-show for appointment    [facility] Jacky Kindle, FNP   1 year ago Annual physical exam   [facility] Margaretann Loveless, [place]   2 years ago Annual physical exam   [facility] [name] M, PA-C              fluticasone [facility]) 50 MCG/ACT nasal spray 48 g 3    Sig: Place 2 sprays into both nostrils daily.     Not Delegated - Ear, Nose, and Throat: Nasal Preparations - Corticosteroids Failed - 06/08/2021  1:04 PM      Failed - This refill cannot be delegated      Passed - Valid encounter within last 12 months    Recent Outpatient Visits          3 months ago Essential hypertension   [facility] Merita Norton T, FNP   4 months ago Blood glucose elevated  [facility] Merita Norton T, FNP   4 months ago No-show for appointment   [facility] Merita Norton T, FNP   1 year ago Annual physical exam   [facility] Margaretann Loveless, [place]   2 years ago Annual physical exam   [facility] [name] M, [place]              simvastatin (ZOCOR) 10 MG tablet 90 tablet 3    Sig: TAKE 1 TABLET BY MOUTH  DAILY AT 6 PM     Cardiovascular:  Antilipid - Statins Failed - 06/08/2021  1:04 PM      Failed - Lipid Panel in normal range within the last 12 months    Cholesterol, Total  Date Value Ref Range Status  12/31/2019 211 (H) 100 - 199 mg/dL Final   LDL Chol Calc (NIH)  Date Value Ref Range Status  12/31/2019 136 (H) 0 - 99 mg/dL Final   HDL  Date Value Ref Range Status  12/31/2019 50 >39 mg/dL Final   Triglycerides  Date Value Ref Range Status  12/31/2019 141 0 - 149 mg/dL Final         Passed - Patient is not pregnant      Passed - Valid encounter within last 12 months    Recent Outpatient Visits          3 months ago Essential hypertension   [facility] Merita Norton T, FNP   4 months ago Blood glucose elevated   [facility]  Jacky Kindle, FNP   4 months ago No-show for appointment   [facility] Jacky Kindle, FNP   1 year ago Annual physical exam   [facility] [name] M, [place]   2 years ago Annual physical exam   [facility] [place], Victorino Dike M, PA-C              Vitamin D, Ergocalciferol, (DRISDOL) 1.25 MG (50000 UNIT) CAPS capsule 15 capsule 3    Sig: Take 1 capsule (50,000 Units total) by mouth every 7 (seven) days.     Endocrinology:  Vitamins - Vitamin D Supplementation 2 Failed - 06/08/2021  1:04 PM      Failed - Manual Review: Route requests for 50,000 IU strength to the provider      Failed - Ca in normal range and within 360 days    Calcium  Date Value Ref Range Status  12/31/2019 9.7 8.7 - 10.3 mg/dL Final         Failed - Vitamin D in normal range and within 360 days    Vit D, 25-Hydroxy  Date Value Ref Range Status  10/05/2017 20.3 (L) 30.0 - 100.0 ng/mL Final    Comment:    Vitamin D deficiency has been defined by the Institute of Medicine and an Endocrine Society practice guideline as a level of serum 25-OH vitamin D less than 20 ng/mL (1,2). The Endocrine Society went on to further define vitamin D insufficiency as a level between 21 and 29 ng/mL (2). 1. IOM (Institute of Medicine). 2010. Dietary reference    intakes for calcium and D. [place]: The    [employer]. 2. Holick MF, Binkley Richton, Bischoff-Ferrari HA, et al.    Evaluation, treatment, and prevention of vitamin D    deficiency: an Endocrine Society clinical practice    guideline. JCEM. 2011 Jul; 96(7):1911-30.  Passed - Valid encounter within last 12 months    Recent Outpatient Visits          3 months ago Essential hypertension   [facility] Merita Norton T, FNP   4 months ago Blood glucose elevated   [facility] Jacky Kindle, FNP   4 months ago No-show for appointment   [facility] Jacky Kindle, FNP   1 year ago Annual physical exam   [facility] Margaretann Loveless, [place]   2 years ago Annual physical exam   [facility] [name] M, PA-C              zolpidem (AMBIEN) 5 MG tablet 90 tablet 0    Sig: Take 1 tablet (5 mg total) by mouth at bedtime as needed.     Not Delegated - Psychiatry:  Anxiolytics/Hypnotics Failed - 06/08/2021  1:04 PM      Failed - This refill cannot be delegated      Failed - Urine Drug Screen completed in last 360 days      Passed - Valid encounter within last 6 months    Recent Outpatient Visits          3 months ago Essential hypertension   [facility] Merita Norton T, FNP   4 months ago Blood glucose elevated   [facility] Jacky Kindle, FNP   4 months ago No-show for appointment   [facility] Jacky Kindle, FNP   1 year ago Annual physical exam   [facility], Alessandra Bevels, [place]   2 years ago Annual physical exam   [facility] Margaretann Loveless, [place]

## 2021-06-08 NOTE — Telephone Encounter (Signed)
Requested medication (s) are due for refill today: Yes  Requested medication (s) are on the active medication list: Yes  Last refill:  06/08/21 (Losartan); all others 2-4 months ago  Future visit scheduled: No  Notes to clinic:  Unable to refill per protocol, cannot delegate, no updated labs, new pharmacy      Requested Prescriptions  Pending Prescriptions Disp Refills   fluticasone (FLONASE) 50 MCG/ACT nasal spray 48 g 3    Sig: Place 2 sprays into both nostrils daily.     Not Delegated - Ear, Nose, and Throat: Nasal Preparations - Corticosteroids Failed - 06/08/2021  1:08 PM      Failed - This refill cannot be delegated      Passed - Valid encounter within last 12 months    Recent Outpatient Visits           3 months ago Essential hypertension   [facility] NortonPayne, Elise T, FNP   4 months ago Blood glucose elevated   [facility] KindlePayne, Elise T, FNP   4 months ago No-show for appointment   [facility] NortonPayne, Elise T, FNP   1 year ago Annual physical exam   [facility] Margaretann LovelessBurnette, Jennifer M, [place]   2 years ago Annual physical exam   [facility] Joycelyn ManBurnette, Jennifer M, [place]               simvastatin (ZOCOR) 10 MG tablet 90 tablet 3    Sig: TAKE 1 TABLET BY MOUTH  DAILY AT 6 PM     Cardiovascular:  Antilipid - Statins Failed - 06/08/2021  1:08 PM      Failed - Lipid Panel in normal range within the last 12 months    Cholesterol, Total  Date Value Ref Range Status  12/31/2019 211 (H) 100 - 199 mg/dL Final   LDL Chol Calc (NIH)  Date Value Ref Range Status  12/31/2019 136 (H) 0 - 99 mg/dL Final   HDL  Date Value Ref Range Status  12/31/2019 50 >39 mg/dL Final   Triglycerides  Date Value Ref Range Status  12/31/2019 141 0 - 149 mg/dL Final         Passed - Patient is not pregnant      Passed - Valid encounter within last 12 months    Recent Outpatient Visits           3 months ago  Essential hypertension   [facility] NortonPayne, Elise T, FNP   4 months ago Blood glucose elevated   [facility] Jacky KindlePayne, Elise T, FNP   4 months ago No-show for appointment   [facility]y KindlePayne, Elise T, FNP   1 year ago Annual physical exam   [facility] Joycelyn ManBurnette, Jennifer M, [place]   2 years ago Annual physical exam   [facility] CameronBurnette, Victorino DikeJennifer M, [place]               Vitamin D, Ergocalciferol, (DRISDOL) 1.25 MG (50000 UNIT) CAPS capsule 15 capsule 3    Sig: Take 1 capsule (50,000 Units total) by mouth every 7 (seven) days.     Endocrinology:  Vitamins - Vitamin D Supplementation 2 Failed - 06/08/2021  1:08 PM      Failed - Manual Review: Route requests for 50,000 IU strength to the provider      Failed - Ca in normal range and within 360 days    Calcium  Date Value Ref  Range Status  12/31/2019 9.7 8.7 - 10.3 mg/dL Final          Failed - Vitamin D in normal range and within 360 days    Vit D, 25-Hydroxy  Date Value Ref Range Status  10/05/2017 20.3 (L) 30.0 - 100.0 ng/mL Final    Comment:    Vitamin D deficiency has been defined by the Institute of Medicine and an Endocrine Society practice guideline as a level of serum 25-OH vitamin D less than 20 ng/mL (1,2). The Endocrine Society went on to further define vitamin D insufficiency as a level between 21 and 29 ng/mL (2). 1. IOM (Institute of Medicine). 2010. Dietary reference    intakes for calcium and D. [place]: The    [employer]. 2. Holick MF, Binkley [place], Bischoff-Ferrari HA, et al.    Evaluation, treatment, and prevention of vitamin D    deficiency: an Endocrine Society clinical practice    guideline. JCEM. 2011 Jul; 96(7):1911-30.           Passed - Valid encounter within last 12 months    Recent Outpatient Visits           3 months ago Essential hypertension   [facility] Merita Norton T, FNP   4  months ago Blood glucose elevated   [facility] Jacky Kindle, FNP   4 months ago No-show for appointment   [facility] Jacky Kindle, FNP   1 year ago Annual physical exam   [facility] Margaretann Loveless, [place]   2 years ago Annual physical exam   [facility] [name] M, PA-C               zolpidem (AMBIEN) 5 MG tablet 90 tablet 0    Sig: Take 1 tablet (5 mg total) by mouth at bedtime as needed.     Not Delegated - Psychiatry:  Anxiolytics/Hypnotics Failed - 06/08/2021  1:08 PM      Failed - This refill cannot be delegated      Failed - Urine Drug Screen completed in last 360 days      Passed - Valid encounter within last 6 months    Recent Outpatient Visits           3 months ago Essential hypertension   [facility] Merita Norton T, FNP   4 months ago Blood glucose elevated   [facility] Jacky Kindle, FNP   4 months ago No-show for appointment   [facility] Jacky Kindle, FNP   1 year ago Annual physical exam   [facility] Margaretann Loveless, [place]   2 years ago Annual physical exam   [facility] [name] M, PA-C               losartan (COZAAR) 100 MG tablet 90 tablet 0    Sig: Take 1 tablet (100 mg total) by mouth daily.     Cardiovascular:  Angiotensin Receptor Blockers Failed - 06/08/2021  1:08 PM      Failed - Cr in normal range and within 180 days    Creatinine, Ser  Date Value Ref Range Status  12/31/2019 0.82 0.57 - 1.00 mg/dL Final          Failed - K in normal range and within 180 days    Potassium  Date Value Ref Range Status  12/31/2019 3.9 3.5 - 5.2 mmol/L Final  Failed - Last BP in normal range    BP Readings from Last 1 Encounters:  05/15/21 (!) 160/82          Passed - Patient is not pregnant      Passed - Valid encounter within last 6 months    Recent Outpatient  Visits           3 months ago Essential hypertension   [facility] Merita Norton T, FNP   4 months ago Blood glucose elevated   [facility] Jacky Kindle, FNP   4 months ago No-show for appointment   [facility] Jacky Kindle, FNP   1 year ago Annual physical exam   [facility] Margaretann Loveless, [place]   2 years ago Annual physical exam   [facility] [name] M, [place]              Signed Prescriptions Disp Refills   losartan (COZAAR) 100 MG tablet 90 tablet 0    Sig: Take 1 tablet (100 mg total) by mouth daily.     Cardiovascular:  Angiotensin Receptor Blockers Failed - 06/08/2021  1:04 PM      Failed - Cr in normal range and within 180 days    Creatinine, Ser  Date Value Ref Range Status  12/31/2019 0.82 0.57 - 1.00 mg/dL Final          Failed - K in normal range and within 180 days    Potassium  Date Value Ref Range Status  12/31/2019 3.9 3.5 - 5.2 mmol/L Final          Failed - Last BP in normal range    BP Readings from Last 1 Encounters:  05/15/21 (!) 160/82          Passed - Patient is not pregnant      Passed - Valid encounter within last 6 months    Recent Outpatient Visits           3 months ago Essential hypertension   [facility] Merita Norton T, FNP   4 months ago Blood glucose elevated   [facility] Jacky Kindle, FNP   4 months ago No-show for appointment   [facility] Jacky Kindle, FNP   1 year ago Annual physical exam   [facility] Margaretann Loveless, [place]   2 years ago Annual physical exam   [facility] [name] M, PA-C               amLODipine (NORVASC) 10 MG tablet 90 tablet 1    Sig: Take 1 tablet (10 mg total) by mouth daily.     Cardiovascular: Calcium Channel Blockers 2 Failed - 06/08/2021  1:08 PM      Failed - Last BP in normal range    BP  Readings from Last 1 Encounters:  05/15/21 (!) 160/82          Passed - Last Heart Rate in normal range    Pulse Readings from Last 1 Encounters:  05/15/21 74          Passed - Valid encounter within last 6 months    Recent Outpatient Visits           3 months ago Essential hypertension   [facility] Merita Norton T, FNP   4 months ago Blood glucose elevated   [facility] Jacky Kindle, [place]   4  months ago No-show for appointment   [facility] Jacky Kindle, FNP   1 year ago Annual physical exam   [facility] Rosezetta Schlatter, Alessandra Bevels, [place]   2 years ago Annual physical exam   [facility] Margaretann Loveless, [place]

## 2021-06-08 NOTE — Telephone Encounter (Signed)
Medication Refill - Medication: Losartan 100 mg  Has the patient contacted their pharmacy? No  she is out and this will be a local pharmacy for this one time  (Agent: If no, request that the patient contact the pharmacy for the refill. If patient does not wish to contact the pharmacy document the reason why and proceed with request.) (Agent: If yes, when and what did the pharmacy advise?)  [facility] Pharm[facility]with phone number [facility]  [address] [place]  Has the patient been seen for an appointment in the last year OR does the patient have an upcoming appointment? Yes.    Agent: Please be advised that RX refills may take up to 3 business days. We ask that you follow-up with your pharmacy.

## 2021-06-08 NOTE — Telephone Encounter (Signed)
[facility] called about the Losartan refill and spoke to [employer], Merchant navy officer. She says they do have refills on file from 02/20/21 #90/3 refills, but the insurance on file is not valid. The patient will need to call the customer service number and update her insurance information, [phone] Order [id number]. Patient called and advised of the above. She says she has a new insurance that she mentioned to the earlier person she spoke to when she called for her refill and told them she now [facility]. I asked if she wanted me to remove [facility], she says yes. Then she says all her medications will need to go there. She also [facility] to [facility] [address] so that she can pick up today. I asked is all of her information at [place], she says it is there and updated. Advised a 90 days [facility], then the others will go to [facility] as requested.

## 2021-06-09 MED ORDER — FLUTICASONE PROPIONATE 50 MCG/ACT NA SUSP: 2.0000 | Freq: Every day | NASAL | 3 refills | Status: DC

## 2021-06-09 MED ORDER — SIMVASTATIN 10 MG PO TABS: ORAL_TABLET | ORAL | 3 refills | Status: DC

## 2021-06-09 MED ORDER — VITAMIN D (ERGOCALCIFEROL) 1.25 MG (50000 UNIT) PO CAPS: 50000.0000 [IU] | ORAL_CAPSULE | ORAL | 0 refills | Status: DC

## 2021-06-09 MED ORDER — ZOLPIDEM TARTRATE 5 MG PO TABS: 5.0000 mg | ORAL_TABLET | Freq: Every evening | ORAL | 0 refills | Status: DC | PRN

## 2021-06-12 ENCOUNTER — Ambulatory Visit (INDEPENDENT_AMBULATORY_CARE_PROVIDER_SITE_OTHER): Payer: No Typology Code available for payment source

## 2021-06-12 DIAGNOSIS — R0981 Nasal congestion: Secondary | ICD-10-CM

## 2021-06-12 DIAGNOSIS — I10 Essential (primary) hypertension: Secondary | ICD-10-CM

## 2021-06-12 DIAGNOSIS — E782 Mixed hyperlipidemia: Secondary | ICD-10-CM

## 2021-06-12 NOTE — Chronic Care Management (AMB) (Cosign Needed)
Chronic Care Management   CCM RN Visit Note  06/12/2021 Name: Marissa Hahn MRN: [id number] DOB: July 18, 1945  Subjective: Marissa Hahn is a 76 y.o. year old female who is a primary care patient of Jacky Kindle, FNP. The care management team was consulted for assistance with disease management and care coordination needs.    Engaged with patient by telephone for follow up visit in response to provider referral for case management and care coordination services.   Consent to Services:  The patient was given information about Chronic Care Management services, agreed to services, and gave verbal consent prior to initiation of services.  Please see initial visit note for detailed documentation.   Assessment: Review of patient past medical history, allergies, medications, health status, including review of consultants reports, laboratory and other test data, was performed as part of comprehensive evaluation and provision of chronic care management services.   SDOH (Social Determinants of Health) assessments and interventions performed: No  CCM Care Plan  Allergies  Allergen Reactions   Acetaminophen-Codeine     Outpatient Encounter Medications as of 06/12/2021  Medication Sig   bisoprolol (ZEBETA) 10 MG tablet Take 1 tablet (10 mg total) by mouth daily.   hydrochlorothiazide (HYDRODIURIL) 25 MG tablet Take 1 tablet (25 mg total) by mouth daily.   amLODipine (NORVASC) 10 MG tablet Take 1 tablet (10 mg total) by mouth daily.   fluticasone (FLONASE) 50 MCG/ACT nasal spray Place 2 sprays into both nostrils daily.   losartan (COZAAR) 100 MG tablet Take 1 tablet (100 mg total) by mouth daily.   simvastatin (ZOCOR) 10 MG tablet TAKE 1 TABLET BY MOUTH  DAILY AT 6 PM   Vitamin D, Cholecalciferol, 1000 units CAPS Take 1 capsule by mouth daily. (Patient not taking: Reported on 03/06/2021)   Vitamin D, Ergocalciferol, (DRISDOL) 1.25 MG (50000 UNIT) CAPS capsule Take 1 capsule (50,000 Units  total) by mouth every 7 (seven) days. Need Vit D lab draw for additional refills; this was filled as a courtesy.   zolpidem (AMBIEN) 5 MG tablet Take 1 tablet (5 mg total) by mouth at bedtime as needed.   No facility-administered encounter medications on file as of 06/12/2021.    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   Interventions: 1:1 collaboration with primary care provider regarding development and update of comprehensive plan of care as evidenced by provider attestation and co-signature Inter-disciplinary care team collaboration (see longitudinal plan of care) Evaluation of current treatment plan related to  self management and patient's adherence to plan as established by provider  Hypertension Interventions: Reviewed plan for hypertension management. She completed initial evaluation by the Cardiology team in January. Her blood pressure medications were adjusted. Reports taking Zebeta and hydrochlorothiazide as instructed. Reports tolerating current regimen  well.  Reviewed established parameters and indications for notifying her PCP or Cardiology provider. Encouraged to consistently monitor BP readings and maintain a log. Reviewed importance of monitoring sodium intake and adhering to the recommended heart healthy/cardiac prudent diet. Reviewed importance of remaining active and engaging in low impact activities/exercise as tolerated.   Reviewed complications related to uncontrolled blood pressure. Reviewed s/sx of heart attack, stroke and worsening symptoms that require immediate medical attention.   Hyperlipidemia Interventions:  (On Track: No New Interventions during this encounter) Medication reviewed Reviewed provider established cholesterol goals  Discussed importance of completing regular laboratory monitoring as prescribed Discussed strategies to manage statin-induced myalgias Reviewed importance of limiting foods high in cholesterol Reviewed exercise goals and importance of consistent activity  Sinus Congestion: Discussed symptoms related to acute sinus and chest congestion. Reports symptoms started over a week ago and have lingered. Reports initially experiencing congestion and a sore throat. Sore throat has resolved but she continues to experience a productive cough and congestion. Describes sputum as thick and clear. Denies episodes of shortness of breath. She has been afebrile. She is currently taking over the counter cold medicines and vitamin C supplements.  Maintaining adequate hydration. Reports avoiding decongestants that elevate BP. Declined current need for evaluation with a provider. She prefers to monitor her symptoms over the weekend. Agreed to contact the clinic if symptoms do not resolve. Update 04/24/21: Report cough and congestion have improved. Reports minimal facial swelling and redness to her eyes which resolved with compresses and eye drops. Overall reports feeling significantly better today. Advised to follow up with  the Optometrist as planned to discuss concerns r/t dry eyes. Reviewed s/sx that require immediate medical attention. Update 06/12/21: Patient continues to experience nasal congestion. Reports symptoms are not severe but require OTC decongestants. Denies chest congestion or cough. Denies episodes of shortness of breath. Denies headaches. She reports sensations of fullness and "popping" in both ears. The episodes have occurred off and on since being evaluated for URI. She expressed interest in being referred to Audiology if the symptoms continue. Reports she is able to work without problems.  Reviewed worsening symptoms and indications for seeking immediate medical follow up.  Patient Goals/Self-Care Activities: Patient will self administer medications as prescribed Patient will attend all scheduled provider appointments Patient will call pharmacy for medication refills Patient will continue to perform ADL's independently Patient will continue to perform IADL's independently Patient will call provider office for new concerns or questions    Follow Up Plan:   Will follow up next month       PLAN: A member of the care management team will follow up next month.   [facility] Care Management [facility] [phone]

## 2021-06-12 NOTE — Patient Instructions (Addendum)
Thank you for allowing the Chronic Care Management team to participate in your care. It was great speaking with you today! °

## 2021-06-16 DIAGNOSIS — E782 Mixed hyperlipidemia: Secondary | ICD-10-CM

## 2021-06-16 DIAGNOSIS — I10 Essential (primary) hypertension: Secondary | ICD-10-CM

## 2021-06-30 ENCOUNTER — Ambulatory Visit (INDEPENDENT_AMBULATORY_CARE_PROVIDER_SITE_OTHER): Payer: No Typology Code available for payment source

## 2021-06-30 DIAGNOSIS — R0981 Nasal congestion: Secondary | ICD-10-CM

## 2021-06-30 DIAGNOSIS — I10 Essential (primary) hypertension: Secondary | ICD-10-CM

## 2021-06-30 NOTE — Chronic Care Management (AMB) (Signed)
?Chronic Care Management  ? ?CCM RN Visit Note ? ?06/30/2021 ?Name: Marissa Hahn MRN: [id number] DOB: [birth year] ? ?Subjective: ?Marissa Hahn is a 76 y.o. year old female who is a primary care patient of Jacky Kindle, FNP. The care management team was consulted for assistance with disease management and care coordination needs.   ? ?Follow up care coordination was conducted today in response to provider referral for case management and care coordination services.  ? ?Consent to Services:  ?The patient was given information about Chronic Care Management services, agreed to services, and gave verbal consent prior to initiation of services.  Please see initial visit note for detailed documentation.  ? ? ?Assessment: Review of patient past medical history, allergies, medications, health status, including review of consultants reports, laboratory and other test data, was performed as part of comprehensive evaluation and provision of chronic care management services.  ? ?SDOH (Social Determinants of Health) assessments and interventions performed:  No ? ?CCM Care Plan ? ?Allergies  ?Allergen Reactions  ? Acetaminophen-Codeine   ? ? ?Outpatient Encounter Medications as of 06/30/2021  ?Medication Sig  ? amLODipine (NORVASC) 10 MG tablet Take 1 tablet (10 mg total) by mouth daily.  ? bisoprolol (ZEBETA) 10 MG tablet Take 1 tablet (10 mg total) by mouth daily.  ? fluticasone (FLONASE) 50 MCG/ACT nasal spray Place 2 sprays into both nostrils daily.  ? hydrochlorothiazide (HYDRODIURIL) 25 MG tablet Take 1 tablet (25 mg total) by mouth daily.  ? losartan (COZAAR) 100 MG tablet Take 1 tablet (100 mg total) by mouth daily.  ? simvastatin (ZOCOR) 10 MG tablet TAKE 1 TABLET BY MOUTH  DAILY AT 6 PM  ? Vitamin D, Cholecalciferol, 1000 units CAPS Take 1 capsule by mouth daily. (Patient not taking: Reported on 03/06/2021)  ? Vitamin D, Ergocalciferol, (DRISDOL) 1.25 MG (50000 UNIT) CAPS capsule Take 1 capsule (50,000 Units  total) by mouth every 7 (seven) days. Need Vit D lab draw for additional refills; this was filled as a courtesy.  ? zolpidem (AMBIEN) 5 MG tablet Take 1 tablet (5 mg total) by mouth at bedtime as needed.  ? ?No facility-administered encounter medications on file as of 06/30/2021.  ? ? ?Patient Active Problem List  ? Diagnosis Date Noted  ? Flu vaccine need 02/20/2021  ? No-show for appointment 01/16/2021  ? Osteoporosis 12/08/2016  ? Benign paroxysmal positional nystagmus 12/31/2014  ? Carpal tunnel syndrome 12/31/2014  ? Blood glucose elevated 12/31/2014  ? Avitaminosis D 10/09/2009  ? Allergic rhinitis 07/29/2009  ? 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 BP (high blood pressure) 10/09/2001  ? Cannot sleep 10/09/2001  ? ? ? ? ?Patient Care Plan: RN Care Management Plan of Care  ?  ? ?Problem Identified: HTN, HLD   ?  ? ?Long-Range Goal: Disease Progression Prevented or Minimized   ?Start Date: 06/12/2021  ?Expected End Date: 09/10/2021  ?Priority: High  ?Note:   ?Current Barriers:  ?Chronic Disease Management support and education needs related to HTN and HLD ? ?RNCM Clinical Goal(s):  ?Patient will demonstrate ongoing adherence to prescribed treatment plan for HTN and HLD through collaboration with the provider and care management team.  ? ?Interventions: ?1:1 collaboration with primary care provider regarding development and update of comprehensive plan of care as evidenced by provider attestation and co-signature ?Inter-disciplinary care team collaboration (see longitudinal plan of care) ?Evaluation of current treatment plan related to  self management and patient's adherence to plan as established by  provider ? ?Hypertension Interventions: ?Reviewed plan for hypertension management. She completed initial evaluation by the Cardiology team in January. Her blood pressure medications were adjusted. Reports taking Zebeta and hydrochlorothiazide as instructed. Reports tolerating current regimen  well.  ?Reviewed established parameters and indications for notifying her PCP or Cardiology provider. Encouraged to consistently monitor BP readings and maintain a log. ?Reviewed importance of monitoring sodium intake and adhering to the recommended heart healthy/cardiac prudent diet. ?Reviewed importance of remaining active and engaging in low impact activities/exercise as tolerated.   ?Reviewed complications related to uncontrolled blood pressure. ?Reviewed s/sx of heart attack, stroke and worsening symptoms that require immediate medical attention. ? ? ?Hyperlipidemia Interventions:  (On Track: No New Interventions during this encounter) ?Medication reviewed ?Reviewed provider established cholesterol goals  ?Discussed importance of completing regular laboratory monitoring as prescribed ?Discussed strategies to manage statin-induced myalgias ?Reviewed importance of limiting foods high in cholesterol ?Reviewed exercise goals and importance of consistent activity ? ?Sinus Congestion: ?Discussed symptoms related to acute sinus and chest congestion. ?Reports symptoms started over a week ago and have lingered. Reports initially experiencing congestion and a sore throat. Sore throat has resolved but she continues to experience a productive cough and congestion. Describes sputum as thick and clear. Denies episodes of shortness of breath. She has been afebrile. She is currently taking over the counter cold medicines and vitamin C supplements.  Maintaining adequate hydration. Reports avoiding decongestants that elevate BP. Declined current need for evaluation with a provider. She prefers to monitor her symptoms over the weekend. Agreed to contact the clinic if symptoms do not resolve. ?Update 04/24/21: Report cough and congestion have improved. Reports minimal facial swelling and redness to her eyes which resolved with compresses and eye drops. Overall reports feeling significantly better today. ?Advised to follow up with  the Optometrist as planned to discuss concerns r/t dry eyes. ?Reviewed s/sx that require immediate medical attention. ?Update 06/12/21: Patient continues to experience nasal congestion. Reports symptoms are not severe but require OTC decongestants. Denies chest congestion or cough. Denies episodes of shortness of breath. Denies headaches. She reports sensations of fullness and "popping" in both ears. The episodes have occurred off and on since being evaluated for URI. She expressed interest in being referred to Audiology if the symptoms continue. Reports she is able to work without problems.  ?Reviewed worsening symptoms and indications for seeking immediate medical follow up. ?Update 07/01/2021: Follow care coordination related to nasal congestion and sensation of fullness in ears. Patient previously expressed interest in further evaluation with an Audiologist if symptoms persist. Symptoms have improved since last outreach. Reports appointments have been a challenge due to work schedule. Will follow up within the next month to discuss symptoms and need for additional referrals. Patient to seek medical care sooner if symptoms worsen. ? ?Patient Goals/Self-Care Activities: ?Patient will self administer medications as prescribed ?Patient will attend all scheduled provider appointments ?Patient will call pharmacy for medication refills ?Patient will continue to perform ADL's independently ?Patient will continue to perform IADL's independently ?Patient will call provider office for new concerns or questions ? ? ? ?Follow Up Plan:   ?Will follow up within the next month  ?  ?  ?PLAN ?A member of the care management team will follow up within the next month. ? ? ?Deron Poole,RN ?[place]/[facility] Care Management ?[facility] ?[phone] ? ? ? ? ? ? ? ?

## 2021-07-17 DIAGNOSIS — I10 Essential (primary) hypertension: Secondary | ICD-10-CM | POA: Diagnosis not present

## 2021-07-17 DIAGNOSIS — E782 Mixed hyperlipidemia: Secondary | ICD-10-CM

## 2021-07-31 ENCOUNTER — Telehealth: Payer: Self-pay | Admitting: Family Medicine

## 2021-07-31 ENCOUNTER — Telehealth: Payer: Self-pay

## 2021-07-31 NOTE — Telephone Encounter (Signed)
Copied from CRM [id number]. Topic: General - Call Back - No Documentation ?>> Jul 31, 2021  4:37 PM Marylen Ponto wrote: ?Reason for CRM: Pt returned call from a missed call she received. Pt requests call back ?

## 2021-08-03 NOTE — Telephone Encounter (Signed)
No documentation of call, patient was called in error. Will close encounter. KW ?

## 2021-08-07 ENCOUNTER — Telehealth: Payer: Self-pay

## 2021-08-07 NOTE — Telephone Encounter (Signed)
?  Care Management  ? ?Follow Up Note ? ? ?08/07/2021 ?Name: Marissa Hahn MRN: [id number] DOB: 1945-12-17 ? ? ?Primary Care Provider: Jacky Kindle, FNP ?Reason for referral : Chronic Care Management ? ? ?An unsuccessful telephone outreach was attempted today. The patient was referred to the case management team for assistance with care management and care coordination.  ? ?Follow Up Plan:  ?A HIPAA compliant voice message was left today requesting a return call. ? ? ?Maui Ahart,RN ?[place]/[facility] Care Management ?[facility] ?[phone] ? ?

## 2021-08-18 NOTE — Telephone Encounter (Signed)
Pt returned call, attempted to call Felecia McCray,RN ?[place]/[facility] Care Management ?[place] ?[phone] ? ?It says phone line is not available  ?

## 2021-08-26 ENCOUNTER — Telehealth: Payer: Self-pay | Admitting: Family Medicine

## 2021-08-26 ENCOUNTER — Other Ambulatory Visit: Payer: Self-pay | Admitting: Family Medicine

## 2021-08-26 DIAGNOSIS — I10 Essential (primary) hypertension: Secondary | ICD-10-CM

## 2021-08-26 MED ORDER — LOSARTAN POTASSIUM 100 MG PO TABS: 100.0000 mg | ORAL_TABLET | Freq: Every day | ORAL | 3 refills | Status: DC

## 2021-08-26 NOTE — Telephone Encounter (Signed)
Please advise if this prescription needs to be refilled by Cardiology. Patient was last seen here for HTN 02/20/2021. She was referred to cardiology. Patient was seen by Cardiologist on 05/15/2021 and medications were changed during that visit.   ?

## 2021-08-26 NOTE — Telephone Encounter (Signed)
[facility] faxed refill request for the following medications:   losartan (COZAAR) 100 MG tablet     Please advise.  

## 2021-09-03 ENCOUNTER — Other Ambulatory Visit: Payer: Self-pay | Admitting: Family Medicine

## 2021-09-03 DIAGNOSIS — G4700 Insomnia, unspecified: Secondary | ICD-10-CM

## 2021-09-04 NOTE — Telephone Encounter (Signed)
Requested medication (s) are due for refill today: yes  Requested medication (s) are on the active medication list: yes  Last refill:  06/09/21  Future visit scheduled: no  Notes to clinic:  called pt to schedule and OV for a 6 month f/u for med RF- LM on VM to call [phone]   Requested Prescriptions  Pending Prescriptions Disp Refills   zolpidem (AMBIEN) 5 MG tablet [Pharmacy Med Name: ZOLPIDEM TAB 5MG ] 90 tablet 0    Sig: TAKE 1 TABLET AT BEDTIME ASNEEDED     Not Delegated - Psychiatry:  Anxiolytics/Hypnotics Failed - 09/03/2021  7:05 PM      Failed - This refill cannot be delegated      Failed - Urine Drug Screen completed in last 360 days      Failed - Valid encounter within last 6 months    Recent Outpatient Visits           6 months ago Essential hypertension   [facility] Tally Joe T, FNP   7 months ago Blood glucose elevated   [facility] Gwyneth Sprout, FNP   7 months ago No-show for appointment   [facility] Gwyneth Sprout, FNP   1 year ago Annual physical exam   [facility], Clearnce Sorrel, [place]   2 years ago Annual physical exam   [facility] [place], [place], [place]

## 2021-09-11 ENCOUNTER — Encounter: Payer: Self-pay | Admitting: Family Medicine

## 2021-09-11 ENCOUNTER — Ambulatory Visit (INDEPENDENT_AMBULATORY_CARE_PROVIDER_SITE_OTHER): Payer: No Typology Code available for payment source | Admitting: Family Medicine

## 2021-09-11 VITALS — BP 174/70 | HR 67 | Resp 16 | Ht 62.0 in | Wt 188.0 lb

## 2021-09-11 DIAGNOSIS — H66002 Acute suppurative otitis media without spontaneous rupture of ear drum, left ear: Secondary | ICD-10-CM | POA: Diagnosis not present

## 2021-09-11 DIAGNOSIS — I10 Essential (primary) hypertension: Secondary | ICD-10-CM | POA: Diagnosis not present

## 2021-09-11 MED ORDER — AMLODIPINE BESYLATE 10 MG PO TABS: 10.0000 mg | ORAL_TABLET | Freq: Every day | ORAL | 1 refills | Status: DC

## 2021-09-11 MED ORDER — VALSARTAN 320 MG PO TABS: 320.0000 mg | ORAL_TABLET | Freq: Every day | ORAL | 3 refills | Status: DC

## 2021-09-11 MED ORDER — AMOXICILLIN-POT CLAVULANATE 875-125 MG PO TABS: 1.0000 | ORAL_TABLET | Freq: Two times a day (BID) | ORAL | 0 refills | Status: DC

## 2021-09-11 NOTE — Assessment & Plan Note (Signed)
Chronic, uncontrolled; goal <140/<90 However, pt reported salt controlled diet Has been taking 1/2 HCTZ d/t GI complaints; encourage full dose and dosing with meals  Denies CP Denies SOB/ DOE Denies low blood pressure/hypotension Denies vision changes No LE Edema noted on exam Change Losartan to Diovan- RTC 6 weeks Guidelines per [facility] BP and Elevated BP Process map and Dr. Duke Salvia HTN Protocol. Seek emergent care if you develop chest pain or chest pressure

## 2021-09-11 NOTE — Progress Notes (Signed)
Established patient visit  I,April Miller,acting as a scribe for Jacky Kindle, FNP.,have documented all relevant documentation on the behalf of Jacky Kindle, FNP,as directed by  Jacky Kindle, FNP while in the presence of Jacky Kindle, FNP.   [name]   DOB: April 03, 1946   76 y.o. Female  MRN: [id number] Visit Date: 09/11/2021  Today's healthcare provider: Jacky Kindle, FNP  Re Introduced to nurse practitioner role and practice setting.  All questions answered.  Discussed provider/patient relationship and expectations.  Chief Complaint  Patient presents with   Follow-up   Hypertension   Subjective    HPI  Hypertension, follow-up  BP Readings from Last 3 Encounters:  09/11/21 (!) 174/70  05/15/21 (!) 160/82  02/20/21 (!) 184/77   Wt Readings from Last 3 Encounters:  09/11/21 188 lb (85.3 kg)  05/15/21 185 lb 6 oz (84.1 kg)  02/20/21 185 lb (83.9 kg)     She was last seen for hypertension 6 months ago.  Management since that visit includes; started losartan.  Outside blood pressures are 154/79.  Pertinent labs Lab Results  Component Value Date   CHOL 211 (H) 12/31/2019   HDL 50 12/31/2019   LDLCALC 136 (H) 12/31/2019   TRIG 141 12/31/2019   CHOLHDL 4.2 12/31/2019   Lab Results  Component Value Date   NA 139 12/31/2019   K 3.9 12/31/2019   CREATININE 0.82 12/31/2019   GFRNONAA 72 12/31/2019   GLUCOSE 100 (H) 12/31/2019   TSH 1.770 12/31/2019     The 10-year ASCVD risk score (Arnett DK, et al., 2019) is: 22.7%  ---------------------------------------------------------------------------------------------------   Medications: Outpatient Medications Prior to Visit  Medication Sig   bisoprolol (ZEBETA) 10 MG tablet Take 1 tablet (10 mg total) by mouth daily.   fluticasone (FLONASE) 50 MCG/ACT nasal spray Place 2 sprays into both nostrils daily.   hydrochlorothiazide (HYDRODIURIL) 25 MG tablet Take 1 tablet (25 mg total) by mouth  daily. (Patient taking differently: Take 25 mg by mouth daily. Taking 1/2 tablet due to side effects)   simvastatin (ZOCOR) 10 MG tablet TAKE 1 TABLET BY MOUTH  DAILY AT 6 PM   Vitamin D, Ergocalciferol, (DRISDOL) 1.25 MG (50000 UNIT) CAPS capsule Take 1 capsule (50,000 Units total) by mouth every 7 (seven) days. Need Vit D lab draw for additional refills; this was filled as a courtesy.   zolpidem (AMBIEN) 5 MG tablet TAKE 1 TABLET AT BEDTIME ASNEEDED   [DISCONTINUED] amLODipine (NORVASC) 10 MG tablet Take 1 tablet (10 mg total) by mouth daily.   [DISCONTINUED] losartan (COZAAR) 100 MG tablet Take 1 tablet (100 mg total) by mouth daily.   [DISCONTINUED] Vitamin D, Cholecalciferol, 1000 units CAPS Take 1 capsule by mouth daily. (Patient not taking: Reported on 03/06/2021)   No facility-administered medications prior to visit.    Review of Systems  Constitutional:  Negative for appetite change, chills, fatigue and fever.  Respiratory:  Negative for chest tightness and shortness of breath.   Cardiovascular:  Negative for chest pain and palpitations.  Gastrointestinal:  Negative for abdominal pain, nausea and vomiting.  Neurological:  Negative for dizziness and weakness.      Objective    BP (!) 174/70 (BP Location: Right Arm, Patient Position: Sitting, Cuff Size: Large)   Pulse 67   Resp 16   Ht 5\' 2"  (1.575 m)   Wt 188 lb (85.3 kg)   SpO2 97%   BMI 34.39 kg/m  Physical Exam Vitals and nursing note reviewed.  Constitutional:      General: She is not in acute distress.    Appearance: Normal appearance. She is obese. She is not ill-appearing, toxic-appearing or diaphoretic.  HENT:     Head: Normocephalic and atraumatic.     Right Ear: Tympanic membrane, ear canal and external ear normal.     Left Ear: Ear canal and external ear normal. A middle ear effusion is present. There is no impacted cerumen. Tympanic membrane is erythematous.     Nose: Nose normal.     Mouth/Throat:      Mouth: Mucous membranes are moist.     Pharynx: Oropharynx is clear.  Cardiovascular:     Rate and Rhythm: Normal rate and regular rhythm.     Pulses: Normal pulses.     Heart sounds: Normal heart sounds. No murmur heard.   No friction rub. No gallop.  Pulmonary:     Effort: Pulmonary effort is normal. No respiratory distress.     Breath sounds: Normal breath sounds. No stridor. No wheezing, rhonchi or rales.  Chest:     Chest wall: No tenderness.  Abdominal:     General: Bowel sounds are normal.     Palpations: Abdomen is soft.  Musculoskeletal:        General: No swelling, tenderness, deformity or signs of injury. Normal range of motion.     Right lower leg: No edema.     Left lower leg: No edema.  Skin:    General: Skin is warm and dry.     Capillary Refill: Capillary refill takes less than 2 seconds.     Coloration: Skin is not jaundiced or pale.     Findings: No bruising, erythema, lesion or rash.  Neurological:     General: No focal deficit present.     Mental Status: She is alert and oriented to person, place, and time. Mental status is at baseline.     Cranial Nerves: No cranial nerve deficit.     Sensory: No sensory deficit.     Motor: No weakness.     Coordination: Coordination normal.  Psychiatric:        Mood and Affect: Mood normal.        Behavior: Behavior normal.        Thought Content: Thought content normal.        Judgment: Judgment normal.     No results found for any visits on 09/11/21.  Assessment & Plan     Problem List Items Addressed This Visit       Cardiovascular and Mediastinum   BP (high blood pressure) - Primary    Chronic, uncontrolled; goal <140/<90 However, pt reported salt controlled diet Has been taking 1/2 HCTZ d/t GI complaints; encourage full dose and dosing with meals  Denies CP Denies SOB/ DOE Denies low blood pressure/hypotension Denies vision changes No LE Edema noted on exam Change Losartan to Diovan- RTC 6  weeks Guidelines per [facility] BP and Elevated BP Process map and Dr. Duke Salviaandolph HTN Protocol. Seek emergent care if you develop chest pain or chest pressure       Relevant Medications   valsartan (DIOVAN) 320 MG tablet   amLODipine (NORVASC) 10 MG tablet     Nervous and Auditory   Non-recurrent acute suppurative otitis media of left ear without spontaneous rupture of tympanic membrane    Acute, stable No systemic complaints Hx of allergic, likely blocked causing acute infection RTC if symptoms  do not improve Plans to f/u with ENT in 4 months once appt opens        Relevant Medications   amoxicillin-clavulanate (AUGMENTIN) 875-125 MG tablet     Return in about 6 weeks (around 10/23/2021) for HTN management.      Leilani Merl, FNP, have reviewed all documentation for this visit. The documentation on 09/11/21 for the exam, diagnosis, procedures, and orders are all accurate and complete.    Jacky Kindle, FNP  [facility] [phone] (phone) [phone] (fax)  [facility]

## 2021-09-11 NOTE — Assessment & Plan Note (Signed)
Acute, stable No systemic complaints Hx of allergic, likely blocked causing acute infection RTC if symptoms do not improve Plans to f/u with ENT in 4 months once appt opens

## 2021-09-12 ENCOUNTER — Other Ambulatory Visit: Payer: Self-pay | Admitting: Family Medicine

## 2021-09-12 DIAGNOSIS — I10 Essential (primary) hypertension: Secondary | ICD-10-CM

## 2021-09-15 DIAGNOSIS — H524 Presbyopia: Secondary | ICD-10-CM | POA: Diagnosis not present

## 2021-09-16 ENCOUNTER — Telehealth: Payer: Self-pay

## 2021-09-16 NOTE — Telephone Encounter (Signed)
Pt called and stated that she is having diarrhea from the antibiotic amoxicillin-clavulanate that was prescribed by her PCP for an ear infection. She stop taking that medication and would like something else prescribed. She suggest ear drops. Please advise pt.

## 2021-09-17 DIAGNOSIS — Z0100 Encounter for examination of eyes and vision without abnormal findings: Secondary | ICD-10-CM | POA: Diagnosis not present

## 2021-09-21 ENCOUNTER — Ambulatory Visit (INDEPENDENT_AMBULATORY_CARE_PROVIDER_SITE_OTHER): Payer: No Typology Code available for payment source

## 2021-09-21 DIAGNOSIS — I10 Essential (primary) hypertension: Secondary | ICD-10-CM

## 2021-09-21 DIAGNOSIS — E782 Mixed hyperlipidemia: Secondary | ICD-10-CM

## 2021-09-21 NOTE — Patient Instructions (Addendum)
Thank you for allowing the Chronic Care Management team to participate in your care. It was great speaking to you today!   Our next telephone outreach is scheduled for November 03, 2021 at 4pm. Please call the care guide team at [phone] if you need to cancel or reschedule your appointment.   Please don't hesitate to contact me if you require assistance before our next scheduled telephone appointment.    [facility] Care Management [facility] [phone]

## 2021-09-21 NOTE — Chronic Care Management (AMB) (Signed)
Chronic Care Management   CCM RN Visit Note  09/21/2021 Name: Marissa Hahn MRN: [id number] DOB: 10-Feb-1946  Subjective: Marissa Hahn is a 76 y.o. year old female who is a primary care patient of Jacky Kindle, FNP. The care management team was consulted for assistance with disease management and care coordination needs.    Engaged with patient by telephone for follow up visit in response to provider referral for case management and care coordination services.   Consent to Services:  The patient was given information about Chronic Care Management services, agreed to services, and gave verbal consent prior to initiation of services.  Please see initial visit note for detailed documentation.   Assessment: Review of patient past medical history, allergies, medications, health status, including review of consultants reports, laboratory and other test data, was performed as part of comprehensive evaluation and provision of chronic care management services.   SDOH (Social Determinants of Health) assessments and interventions performed: No  CCM Care Plan  Allergies  Allergen Reactions   Acetaminophen-Codeine     Outpatient Encounter Medications as of 09/21/2021  Medication Sig Note   amLODipine (NORVASC) 10 MG tablet Take 1 tablet (10 mg total) by mouth daily.    bisoprolol (ZEBETA) 10 MG tablet Take 1 tablet (10 mg total) by mouth daily.    fluticasone (FLONASE) 50 MCG/ACT nasal spray Place 2 sprays into both nostrils daily.    simvastatin (ZOCOR) 10 MG tablet TAKE 1 TABLET BY MOUTH  DAILY AT 6 PM    valsartan (DIOVAN) 320 MG tablet Take 1 tablet (320 mg total) by mouth daily.    Vitamin D, Ergocalciferol, (DRISDOL) 1.25 MG (50000 UNIT) CAPS capsule Take 1 capsule (50,000 Units total) by mouth every 7 (seven) days. Need Vit D lab draw for additional refills; this was filled as a courtesy.    zolpidem (AMBIEN) 5 MG tablet TAKE 1 TABLET AT BEDTIME ASNEEDED    amoxicillin-clavulanate  (AUGMENTIN) 875-125 MG tablet Take 1 tablet by mouth 2 (two) times daily. (Patient not taking: Reported on 09/21/2021) 09/21/2021: Dose Complete   hydrochlorothiazide (HYDRODIURIL) 25 MG tablet Take 1 tablet (25 mg total) by mouth daily. (Patient taking differently: Take 25 mg by mouth daily. Taking 1/2 tablet due to side effects) 09/21/2021: Patient reports not taking   No facility-administered encounter medications on file as of 09/21/2021.    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   Interventions: 1:1 collaboration with primary care provider regarding  development and update of comprehensive plan of care as evidenced by provider attestation and co-signature Inter-disciplinary care team collaboration (see longitudinal plan of care) Evaluation of current treatment plan related to  self management and patient's adherence to plan as established by  provider  Hypertension Interventions: Reviewed plan for hypertension management. Reports adjusting medications as advised by the Cardiology team. Confirmed starting valstartan 320 mg as instructed. Reviewed established parameters and indications for notifying her PCP or Cardiology provider. Reports monitoring routinely. Reports readings have been within range. Advised to monitor daily and maintain a log. Advised to bring log to her next clinic visit.   Reviewed importance of monitoring sodium intake and adhering to the recommended heart healthy/cardiac prudent diet. Reviewed importance of remaining active and engaging in low impact activities/exercise as tolerated.   Reviewed complications related to uncontrolled blood pressure. Reviewed s/sx of heart attack, stroke and worsening symptoms that require immediate medical attention.   Hyperlipidemia Interventions:  (On Track: No New Interventions during this encounter) Medication reviewed Reviewed provider established cholesterol goals  Discussed importance of completing regular laboratory monitoring as prescribed Discussed strategies to manage statin-induced myalgias Reviewed importance of limiting foods high in cholesterol Reviewed exercise goals and importance of consistent activity    Patient Goals/Self-Care Activities: Patient will self administer medications as prescribed Patient will attend all scheduled provider appointments Patient will call pharmacy for medication refills Patient will continue to perform ADL's independently Patient will continue to perform IADL's independently Patient will call provider office for new concerns or questions    Follow Up Plan:   Will follow up next month.      PLAN A member of the care management team will follow up in July.    [facility] [phone]

## 2021-10-01 ENCOUNTER — Ambulatory Visit (INDEPENDENT_AMBULATORY_CARE_PROVIDER_SITE_OTHER): Payer: No Typology Code available for payment source

## 2021-10-01 VITALS — Wt 188.0 lb

## 2021-10-01 DIAGNOSIS — Z0000 Encounter for general adult medical examination without abnormal findings: Secondary | ICD-10-CM

## 2021-10-01 DIAGNOSIS — Z780 Asymptomatic menopausal state: Secondary | ICD-10-CM

## 2021-10-01 DIAGNOSIS — Z1231 Encounter for screening mammogram for malignant neoplasm of breast: Secondary | ICD-10-CM | POA: Diagnosis not present

## 2021-10-01 NOTE — Patient Instructions (Signed)
Marissa Hahn , Thank you for taking time to come for your Medicare Wellness Visit. I appreciate your ongoing commitment to your health goals. Please review the following plan we discussed and let me know if I can assist you in the future.   Screening recommendations/referrals: Colonoscopy: declined referral Mammogram: referral sent Bone Density: referral sent Recommended yearly ophthalmology/optometry visit for glaucoma screening and checkup Recommended yearly dental visit for hygiene and checkup  Vaccinations: Influenza vaccine: 02/20/21 Pneumococcal vaccine: 21/6/16 Tdap vaccine: n/d Shingles vaccine: n/d   Covid-19:05/23/19, 06/20/19  Advanced directives: no  Conditions/risks identified: none  Next appointment: Follow up in one year for your annual wellness visit 10/05/22 @ 3:15 pm by phone   Preventive Care 65 Years and Older, Female Preventive care refers to lifestyle choices and visits with your health care provider that can promote health and wellness. What does preventive care include? A yearly physical exam. This is also called an annual well check. Dental exams once or twice a year. Routine eye exams. Ask your health care provider how often you should have your eyes checked. Personal lifestyle choices, including: Daily care of your teeth and gums. Regular physical activity. Eating a healthy diet. Avoiding tobacco and drug use. Limiting alcohol use. Practicing safe sex. Taking low-dose aspirin every day. Taking vitamin and mineral supplements as recommended by your health care provider. What happens during an annual well check? The services and screenings done by your health care provider during your annual well check will depend on your age, overall health, lifestyle risk factors, and family history of disease. Counseling  Your health care provider may ask you questions about your: Alcohol use. Tobacco use. Drug use. Emotional well-being. Home and relationship  well-being. Sexual activity. Eating habits. History of falls. Memory and ability to understand (cognition). Work and work Astronomer. Reproductive health. Screening  You may have the following tests or measurements: Height, weight, and BMI. Blood pressure. Lipid and cholesterol levels. These may be checked every 5 years, or more frequently if you are over 67 years old. Skin check. Lung cancer screening. You may have this screening every year starting at age 16 if you have a 30-pack-year history of smoking and currently smoke or have quit within the past 15 years. Fecal occult blood test (FOBT) of the stool. You may have this test every year starting at age 34. Flexible sigmoidoscopy or colonoscopy. You may have a sigmoidoscopy every 5 years or a colonoscopy every 10 years starting at age 102. Hepatitis C blood test. Hepatitis B blood test. Sexually transmitted disease (STD) testing. Diabetes screening. This is done by checking your blood sugar (glucose) after you have not eaten for a while (fasting). You may have this done every 1-3 years. Bone density scan. This is done to screen for osteoporosis. You may have this done starting at age 25. Mammogram. This may be done every 1-2 years. Talk to your health care provider about how often you should have regular mammograms. Talk with your health care provider about your test results, treatment options, and if necessary, the need for more tests. Vaccines  Your health care provider may recommend certain vaccines, such as: Influenza vaccine. This is recommended every year. Tetanus, diphtheria, and acellular pertussis (Tdap, Td) vaccine. You may need a Td booster every 10 years. Zoster vaccine. You may need this after age 73. Pneumococcal 13-valent conjugate (PCV13) vaccine. One dose is recommended after age 78. Pneumococcal polysaccharide (PPSV23) vaccine. One dose is recommended after age 76. Talk to your  health care provider about which  screenings and vaccines you need and how often you need them. This information is not intended to replace advice given to you by your health care provider. Make sure you discuss any questions you have with your health care provider. Document Released: 05/02/2015 Document Revised: 12/24/2015 Document Reviewed: 02/04/2015 Elsevier Interactive Patient Education  2017 [employer] Prevention in the Home Falls can cause injuries. They can happen to people of all ages. There are many things you can do to make your home safe and to help prevent falls. What can I do on the outside of my home? Regularly fix the edges of walkways and driveways and fix any cracks. Remove anything that might make you trip as you walk through a door, such as a raised step or threshold. Trim any bushes or trees on the path to your home. Use bright outdoor lighting. Clear any walking paths of anything that might make someone trip, such as rocks or tools. Regularly check to see if handrails are loose or broken. Make sure that both sides of any steps have handrails. Any raised decks and porches should have guardrails on the edges. Have any leaves, snow, or ice cleared regularly. Use sand or salt on walking paths during winter. Clean up any spills in your garage right away. This includes oil or grease spills. What can I do in the bathroom? Use night lights. Install grab bars by the toilet and in the tub and shower. Do not use towel bars as grab bars. Use non-skid mats or decals in the tub or shower. If you need to sit down in the shower, use a plastic, non-slip stool. Keep the floor dry. Clean up any water that spills on the floor as soon as it happens. Remove soap buildup in the tub or shower regularly. Attach bath mats securely with double-sided non-slip rug tape. Do not have throw rugs and other things on the floor that can make you trip. What can I do in the bedroom? Use night lights. Make sure that you have a  light by your bed that is easy to reach. Do not use any sheets or blankets that are too big for your bed. They should not hang down onto the floor. Have a firm chair that has side arms. You can use this for support while you get dressed. Do not have throw rugs and other things on the floor that can make you trip. What can I do in the kitchen? Clean up any spills right away. Avoid walking on wet floors. Keep items that you use a lot in easy-to-reach places. If you need to reach something above you, use a strong step stool that has a grab bar. Keep electrical cords out of the way. Do not use floor polish or wax that makes floors slippery. If you must use wax, use non-skid floor wax. Do not have throw rugs and other things on the floor that can make you trip. What can I do with my stairs? Do not leave any items on the stairs. Make sure that there are handrails on both sides of the stairs and use them. Fix handrails that are broken or loose. Make sure that handrails are as long as the stairways. Check any carpeting to make sure that it is firmly attached to the stairs. Fix any carpet that is loose or worn. Avoid having throw rugs at the top or bottom of the stairs. If you do have throw rugs, attach them  to the floor with carpet tape. Make sure that you have a light switch at the top of the stairs and the bottom of the stairs. If you do not have them, ask someone to add them for you. What else can I do to help prevent falls? Wear shoes that: Do not have high heels. Have rubber bottoms. Are comfortable and fit you well. Are closed at the toe. Do not wear sandals. If you use a stepladder: Make sure that it is fully opened. Do not climb a closed stepladder. Make sure that both sides of the stepladder are locked into place. Ask someone to hold it for you, if possible. Clearly mark and make sure that you can see: Any grab bars or handrails. First and last steps. Where the edge of each step  is. Use tools that help you move around (mobility aids) if they are needed. These include: Canes. Walkers. Scooters. Crutches. Turn on the lights when you go into a dark area. Replace any light bulbs as soon as they burn out. Set up your furniture so you have a clear path. Avoid moving your furniture around. If any of your floors are uneven, fix them. If there are any pets around you, be aware of where they are. Review your medicines with your doctor. Some medicines can make you feel dizzy. This can increase your chance of falling. Ask your doctor what other things that you can do to help prevent falls. This information is not intended to replace advice given to you by your health care provider. Make sure you discuss any questions you have with your health care provider. Document Released: 01/30/2009 Document Revised: 09/11/2015 Document Reviewed: 05/10/2014 Elsevier Interactive Patient Education  2017 [employer].

## 2021-10-01 NOTE — Progress Notes (Signed)
Virtual Visit via Telephone Note  I connected with  Marissa Hahn on 10/01/21 at  3:30 PM EDT by telephone and verified that I am speaking with the correct person using two identifiers.  Location: Patient: home Provider: [facility] Persons participating in the virtual visit: patient/Nurse Health Advisor   I discussed the limitations, risks, security and privacy concerns of performing an evaluation and management service by telephone and the availability of in person appointments. The patient expressed understanding and agreed to proceed.  Interactive audio and video telecommunications were attempted between this nurse and patient, however failed, due to patient having technical difficulties OR patient did not have access to video capability.  We continued and completed visit with audio only.  Some vital signs may be absent or patient reported.   Hal Hope, LPN  Subjective:   Marissa Hahn is a 76 y.o. female who presents for Medicare Annual (Subsequent) preventive examination.  Review of Systems     Cardiac Risk Factors include: advanced age (>33men, >22 women);hypertension;dyslipidemia     Objective:    There were no vitals filed for this visit. There is no height or weight on file to calculate BMI.     10/01/2021    3:33 PM 10/18/2019   10:46 AM 10/17/2018    2:31 PM 10/04/2017   10:40 AM 10/07/2016    8:39 AM 10/05/2016    8:45 AM 03/25/2015   11:30 AM  Advanced Directives  Does Patient Have a Medical Advance Directive? No Yes Yes No No No Yes  Type of Science writer of [place];Living will    Healthcare Power of [place];Living will  Does patient want to make changes to medical advance directive?    No - Patient declined     Copy of Healthcare Power of Attorney in Chart?  No - copy requested No - copy requested      Would patient like information on creating a medical advance directive? No - Patient declined   No -  Patient declined       Current Medications (verified) Outpatient Encounter Medications as of 10/01/2021  Medication Sig   amLODipine (NORVASC) 10 MG tablet Take 1 tablet (10 mg total) by mouth daily.   bisoprolol (ZEBETA) 10 MG tablet Take 1 tablet (10 mg total) by mouth daily.   fluticasone (FLONASE) 50 MCG/ACT nasal spray Place 2 sprays into both nostrils daily.   simvastatin (ZOCOR) 10 MG tablet TAKE 1 TABLET BY MOUTH  DAILY AT 6 PM   valsartan (DIOVAN) 320 MG tablet Take 1 tablet (320 mg total) by mouth daily.   Vitamin D, Ergocalciferol, (DRISDOL) 1.25 MG (50000 UNIT) CAPS capsule Take 1 capsule (50,000 Units total) by mouth every 7 (seven) days. Need Vit D lab draw for additional refills; this was filled as a courtesy.   zolpidem (AMBIEN) 5 MG tablet TAKE 1 TABLET AT BEDTIME ASNEEDED   amoxicillin-clavulanate (AUGMENTIN) 875-125 MG tablet Take 1 tablet by mouth 2 (two) times daily. (Patient not taking: Reported on 09/21/2021)   hydrochlorothiazide (HYDRODIURIL) 25 MG tablet Take 1 tablet (25 mg total) by mouth daily. (Patient taking differently: Take 25 mg by mouth daily. Taking 1/2 tablet due to side effects)   No facility-administered encounter medications on file as of 10/01/2021.    Allergies (verified) Acetaminophen-codeine   History: Past Medical History:  Diagnosis Date   Hyperlipidemia    Hypertension    Past Surgical History:  Procedure Laterality Date  ABDOMINAL HYSTERECTOMY  1990   Abdominal   CARPAL TUNNEL RELEASE Left 01/2010   Family History  Problem Relation Age of Onset   Hypertension Mother    Lung cancer Father    Healthy Other    Hypertension Sister    Hypertension Sister    Hypertension Sister    Breast cancer Neg Hx    Social History   Socioeconomic History   Marital status: Divorced    Spouse name: Not on file   Number of children: 2   Years of education: H/S   Highest education level: 12th grade  Occupational History   Occupation: Retired   Tobacco Use   Smoking status: Never   Smokeless tobacco: Never  Vaping Use   Vaping Use: Never used  Substance and Sexual Activity   Alcohol use: Not Currently   Drug use: No   Sexual activity: Not on file  Other Topics Concern   Not on file  Social History Narrative   Not on file   Social Determinants of Health   Financial Resource Strain: Low Risk  (10/01/2021)   Overall Financial Resource Strain (CARDIA)    Difficulty of Paying Living Expenses: Not hard at all  Food Insecurity: No Food Insecurity (10/01/2021)   Hunger Vital Sign    Worried About Running Out of Food in the Last Year: Never true    Ran Out of Food in the Last Year: Never true  Transportation Needs: No Transportation Needs (10/01/2021)   PRAPARE - Administrator, Civil Service (Medical): No    Lack of Transportation (Non-Medical): No  Physical Activity: Insufficiently Active (10/01/2021)   Exercise Vital Sign    Days of Exercise per Week: 3 days    Minutes of Exercise per Session: 30 min  Stress: No Stress Concern Present (10/01/2021)   [employer] of Occupational Health - Occupational Stress Questionnaire    Feeling of Stress : Not at all  Social Connections: Moderately Isolated (10/01/2021)   Social Connection and Isolation Panel [NHANES]    Frequency of Communication with Friends and Family: More than three times a week    Frequency of Social Gatherings with Friends and Family: Once a week    Attends Religious Services: More than 4 times per year    Active Member of [employer] or Organizations: No    Attends Engineer, structural: Never    Marital Status: Divorced    Tobacco Counseling Counseling given: Not Answered   Clinical Intake:  Pre-visit preparation completed: Yes  Pain : No/denies pain     Nutritional Risks: None Diabetes: No  How often do you need to have someone help you when you read instructions, pamphlets, or other written materials from your doctor or  pharmacy?: 1 - Never  Diabetic?no  Interpreter Needed?: No  Information entered by :: Kennedy Bucker, LPN   Activities of Daily Living    10/01/2021    3:34 PM 09/11/2021    1:52 PM  In your present state of health, do you have any difficulty performing the following activities:  Hearing? 0 0  Vision? 0 0  Difficulty concentrating or making decisions? 0 0  Walking or climbing stairs? 0 0  Dressing or bathing? 0 0  Doing errands, shopping?  0  Preparing Food and eating ? N   Using the Toilet? N   In the past six months, have you accidently leaked urine? N   Do you have problems with loss of bowel control? N  Managing your Medications? N   Managing your Finances? N   Housekeeping or managing your Housekeeping? N     Patient Care Team: Jacky Kindle, FNP as PCP - General (Family Medicine) [facility], [facility] Constance Haw, [place], RN as Case Manager  Indicate any recent Medical Services you may have received from other than [facility] providers in the past year (date may be approximate).     Assessment:   This is a routine wellness examination for Marissa Hahn.  Hearing/Vision screen Hearing Screening - Comments:: No aids Vision Screening - Comments:: Wears glasses/contacts- [facility]  Dietary issues and exercise activities discussed: Current Exercise Habits: Home exercise routine, Type of exercise: walking, Time (Minutes): 30, Frequency (Times/Week): 3, Weekly Exercise (Minutes/Week): 90, Intensity: Mild   Goals Addressed             This Visit's Progress    DIET - EAT MORE FRUITS AND VEGETABLES         Depression Screen    10/01/2021    3:30 PM 09/11/2021    1:52 PM 03/06/2021    9:39 PM 01/16/2021    2:16 PM 10/18/2019   10:34 AM 10/17/2018    2:31 PM 10/04/2017   10:42 AM  PHQ 2/9 Scores  PHQ - 2 Score 0 0 0 0 0 0 1  PHQ- 9 Score 1 1         Fall Risk    10/01/2021    3:34 PM 09/11/2021    1:51 PM 01/23/2021    1:17 PM 10/18/2019   10:46 AM 10/17/2018     2:31 PM  Fall Risk   Falls in the past year? 0 0 0 0 0  Number falls in past yr: 0 0 0 0   Injury with Fall? 0 0 0 0   Risk for fall due to : No Fall Risks No Fall Risks     Follow up Falls evaluation completed Falls evaluation completed       FALL RISK PREVENTION PERTAINING TO THE HOME:  Any stairs in or around the home? No  If so, are there any without handrails? No  Home free of loose throw rugs in walkways, pet beds, electrical cords, etc? Yes  Adequate lighting in your home to reduce risk of falls? Yes   ASSISTIVE DEVICES UTILIZED TO PREVENT FALLS:  Life alert? No  Use of a cane, walker or w/c? No  Grab bars in the bathroom? Yes  Shower chair or bench in shower? No  Elevated toilet seat or a handicapped toilet? No   Cognitive Function:        10/01/2021    3:36 PM 10/05/2016    8:48 AM  6CIT Screen  What Year? 0 points 0 points  What month? 0 points 0 points  What time? 0 points 0 points  Count back from 20 0 points 0 points  Months in reverse 0 points 0 points  Repeat phrase 0 points 0 points  Total Score 0 points 0 points    Immunizations Immunization History  Administered Date(s) Administered   Fluad Quad(high Dose 65+) 02/20/2021   Influenza-Unspecified 03/05/2019   Moderna Sars-Covid-2 Vaccination 05/23/2019, 06/20/2019   Pneumococcal Conjugate-13 08/24/2013   Pneumococcal Polysaccharide-23 03/25/2015    TDAP status: Due, Education has been provided regarding the importance of this vaccine. Advised may receive this vaccine at local pharmacy or Health Dept. Aware to provide a copy of the vaccination record if obtained from local pharmacy or Health  Dept. Verbalized acceptance and understanding.  Flu Vaccine status: Up to date  Pneumococcal vaccine status: Up to date  Covid-19 vaccine status: Completed vaccines  Qualifies for Shingles Vaccine? Yes   Zostavax completed No   Shingrix Completed?: No.    Education has been provided regarding the  importance of this vaccine. Patient has been advised to call insurance company to determine out of pocket expense if they have not yet received this vaccine. Advised may also receive vaccine at local pharmacy or Health Dept. Verbalized acceptance and understanding.  Screening Tests Health Maintenance  Topic Date Due   Zoster Vaccines- Shingrix (1 of 2) Never done   DEXA SCAN  12/09/2018   COVID-19 Vaccine (3 - Moderna series) 08/15/2019   MAMMOGRAM  05/01/2021   COLONOSCOPY (Pts 45-73yrs Insurance coverage will need to be confirmed)  04/19/2026 (Originally 08/01/2016)   TETANUS/TDAP  04/19/2026 (Originally [birth year]/1967)   INFLUENZA VACCINE  11/17/2021   Pneumonia Vaccine 46+ Years old  Completed   Hepatitis C Screening  Completed   HPV VACCINES  Aged Out    Health Maintenance  Health Maintenance Due  Topic Date Due   Zoster Vaccines- Shingrix (1 of 2) Never done   DEXA SCAN  12/09/2018   COVID-19 Vaccine (3 - Moderna series) 08/15/2019   MAMMOGRAM  05/01/2021    Declined referral  Mammogram status: Completed 05/02/19. Repeat every year- referral sent  Bone Density status: Completed 12/08/16. Results reflect: Bone density results: OSTEOPOROSIS. Repeat every 2 years.  Lung Cancer Screening: (Low Dose CT Chest recommended if Age 72-80 years, 30 pack-year currently smoking OR have quit w/in 15years.) does not qualify.   Additional Screening:  Hepatitis C Screening: does qualify; Completed 10/07/16  Vision Screening: Recommended annual ophthalmology exams for early detection of glaucoma and other disorders of the eye. Is the patient up to date with their annual eye exam?  Yes  Who is the provider or what is the name of the office in which the patient attends annual eye exams? [facility] If pt is not established with a provider, would they like to be referred to a provider to establish care? No .   Dental Screening: Recommended annual dental exams for proper oral  hygiene  Community Resource Referral / Chronic Care Management: CRR required this visit?  No   CCM required this visit?  No      Plan:     I have personally reviewed and noted the following in the patient's chart:   Medical and social history Use of alcohol, tobacco or illicit drugs  Current medications and supplements including opioid prescriptions.  Functional ability and status Nutritional status Physical activity Advanced directives List of other physicians Hospitalizations, surgeries, and ER visits in previous 12 months Vitals Screenings to include cognitive, depression, and falls Referrals and appointments  In addition, I have reviewed and discussed with patient certain preventive protocols, quality metrics, and best practice recommendations. A written personalized care plan for preventive services as well as general preventive health recommendations were provided to patient.     Hal Hope, LPN   11/26/9831   Nurse Notes: none

## 2021-10-05 ENCOUNTER — Telehealth: Payer: Self-pay | Admitting: Family Medicine

## 2021-10-05 DIAGNOSIS — G4700 Insomnia, unspecified: Secondary | ICD-10-CM

## 2021-10-05 NOTE — Telephone Encounter (Signed)
Medication Refill - Medication:  zolpidem (AMBIEN) 5 MG tablet  Has the patient contacted their pharmacy? Yes.   Contact PCP  Preferred Pharmacy (with phone number or street name):  [id number] - [place], [place] [address] Phone:  [phone]  Fax:  [phone]      Has the patient been seen for an appointment in the last year OR does the patient have an upcoming appointment? Yes.    Agent: Please be advised that RX refills may take up to 3 business days. We ask that you follow-up with your pharmacy.

## 2021-10-05 NOTE — Telephone Encounter (Signed)
Requested medication (s) are due for refill today - no  Requested medication (s) are on the active medication list -yes  Future visit scheduled -yes  Last refill: 09/08/21 #90  Notes to clinic: non delegated Rx  Requested Prescriptions  Pending Prescriptions Disp Refills   zolpidem (AMBIEN) 5 MG tablet 90 tablet 0     Not Delegated - Psychiatry:  Anxiolytics/Hypnotics Failed - 10/05/2021  8:51 AM      Failed - This refill cannot be delegated      Failed - Urine Drug Screen completed in last 360 days      Passed - Valid encounter within last 6 months    Recent Outpatient Visits           3 weeks ago Primary hypertension   [facility] Jacky Kindle, FNP   7 months ago Essential hypertension   [facility] Merita Norton T, FNP   8 months ago Blood glucose elevated   [facility] Jacky Kindle, FNP   8 months ago No-show for appointment   [facility] Jacky Kindle, FNP   1 year ago Annual physical exam   [facility], [place], [place]       Future Appointments             In 3 weeks Jacky Kindle, FNP [facility], [facility]               Requested Prescriptions  Pending Prescriptions Disp Refills   zolpidem (AMBIEN) 5 MG tablet 90 tablet 0     Not Delegated - Psychiatry:  Anxiolytics/Hypnotics Failed - 10/05/2021  8:51 AM      Failed - This refill cannot be delegated      Failed - Urine Drug Screen completed in last 360 days      Passed - Valid encounter within last 6 months    Recent Outpatient Visits           3 weeks ago Primary hypertension   [facility] Jacky Kindle, FNP   7 months ago Essential hypertension   [facility] Merita Norton T, FNP   8 months ago Blood glucose elevated   [facility] Jacky Kindle, FNP   8 months ago No-show for appointment   [facility] Jacky Kindle, FNP   1 year  ago Annual physical exam   [facility], [place], [place]       Future Appointments             In 3 weeks Jacky Kindle, FNP [facility], [facility]

## 2021-10-06 ENCOUNTER — Ambulatory Visit: Payer: No Typology Code available for payment source

## 2021-10-06 ENCOUNTER — Other Ambulatory Visit: Payer: Self-pay | Admitting: Family Medicine

## 2021-10-06 DIAGNOSIS — I10 Essential (primary) hypertension: Secondary | ICD-10-CM

## 2021-10-06 DIAGNOSIS — G4700 Insomnia, unspecified: Secondary | ICD-10-CM

## 2021-10-06 MED ORDER — ZOLPIDEM TARTRATE 5 MG PO TABS: ORAL_TABLET | ORAL | 0 refills | Status: DC

## 2021-10-07 NOTE — Chronic Care Management (AMB) (Signed)
Chronic Care Management   CCM RN Visit Note   Name: Marissa Hahn MRN: [id number] DOB: 06-Sep-1945  Subjective: Marissa Hahn is a 76 y.o. year old female who is a primary care patient of Jacky Kindle, FNP. The care management team was consulted for assistance with disease management and care coordination needs.    Engaged with patient by telephone for follow up visit in response to provider referral for case management and care coordination services.   Consent to Services:  The patient was given information about Chronic Care Management services, agreed to services, and gave verbal consent prior to initiation of services.  Please see initial visit note for detailed documentation.   Assessment: Review of patient past medical history, allergies, medications, health status, including review of consultants reports, laboratory and other test data, was performed as part of comprehensive evaluation and provision of chronic care management services.   SDOH (Social Determinants of Health) assessments and interventions performed: No  CCM Care Plan  Allergies  Allergen Reactions   Acetaminophen-Codeine     Outpatient Encounter Medications as of 10/06/2021  Medication Sig Note   amLODipine (NORVASC) 10 MG tablet Take 1 tablet (10 mg total) by mouth daily.    amoxicillin-clavulanate (AUGMENTIN) 875-125 MG tablet Take 1 tablet by mouth 2 (two) times daily. (Patient not taking: Reported on 09/21/2021) 09/21/2021: Dose Complete   bisoprolol (ZEBETA) 10 MG tablet Take 1 tablet (10 mg total) by mouth daily.    fluticasone (FLONASE) 50 MCG/ACT nasal spray Place 2 sprays into both nostrils daily.    hydrochlorothiazide (HYDRODIURIL) 25 MG tablet Take 1 tablet (25 mg total) by mouth daily. (Patient taking differently: Take 25 mg by mouth daily. Taking 1/2 tablet due to side effects) 09/21/2021: Patient reports not taking   simvastatin (ZOCOR) 10 MG tablet TAKE 1 TABLET BY MOUTH  DAILY AT 6 PM     valsartan (DIOVAN) 320 MG tablet Take 1 tablet (320 mg total) by mouth daily.    Vitamin D, Ergocalciferol, (DRISDOL) 1.25 MG (50000 UNIT) CAPS capsule Take 1 capsule (50,000 Units total) by mouth every 7 (seven) days. Need Vit D lab draw for additional refills; this was filled as a courtesy.    [DISCONTINUED] zolpidem (AMBIEN) 5 MG tablet TAKE 1 TABLET AT BEDTIME ASNEEDED    No facility-administered encounter medications on file as of 10/06/2021.    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   Interventions: 1:1 collaboration with  primary care provider regarding development and update of comprehensive plan of care as evidenced by provider attestation and co-signature Inter-disciplinary care team collaboration (see longitudinal plan of care) Evaluation of current treatment plan related to  self management and patient's adherence to plan as established by  provider  Hypertension Interventions: Reviewed plan for hypertension management. Reports adjusting medications as advised by the Cardiology team. Confirmed starting valstartan 320 mg as instructed. Reviewed established parameters and indications for notifying her PCP or Cardiology provider. Reports monitoring routinely. Reports readings have been within range. Advised to monitor daily and maintain a log. Advised to bring log to her next clinic visit.   Reviewed importance of monitoring sodium intake and adhering to the recommended heart healthy/cardiac prudent diet. Reviewed importance of remaining active and engaging in low impact activities/exercise as tolerated.   Reviewed complications related to uncontrolled blood pressure. Reviewed s/sx of heart attack, stroke and worsening symptoms that require immediate medical attention.   Hyperlipidemia Interventions:  (On Track: No New Interventions during this encounter) Medication reviewed Reviewed provider established cholesterol goals  Discussed importance of completing regular laboratory monitoring as prescribed Discussed strategies to manage statin-induced myalgias Reviewed importance of limiting foods high in cholesterol Reviewed exercise goals and importance of consistent activity  Care Coordination/Medications (New Goal) Discussed concerns regarding medication. Reports currently receiving medications via [employer]. She was due to receive a refill of Ambien. Per medication record, the order was submitted on 09/08/21. She received a notification that her insurance provider([employer]) only approved one order for Ambien per year. The prescription was not filled d/t her receiving a prescription earlier this year (06/09/21) Reports not requiring Ambien nightly but notes significant difficulty sleeping over the past week. Reports being advised to request an order to be submitted to the local pharmacy. She will incur out of pocket  charges. She [phone]. Informed PCP. Prescription will be sent today. Patient will follow up if additional information is being requested from pharmacy.   Patient Goals/Self-Care Activities: Patient will self administer medications as prescribed Patient will attend all scheduled provider appointments Patient will call pharmacy for medication refills Patient will continue to perform ADL's independently Patient will continue to perform IADL's independently Patient will call provider office for new concerns or questions    Follow Up Plan:   Will follow up this week      PLAN Will follow up this week.   [facility] Care Management [facility] [phone]

## 2021-10-15 NOTE — Telephone Encounter (Signed)
Patient picked medication up already.

## 2021-10-16 DIAGNOSIS — E785 Hyperlipidemia, unspecified: Secondary | ICD-10-CM | POA: Diagnosis not present

## 2021-10-16 DIAGNOSIS — I10 Essential (primary) hypertension: Secondary | ICD-10-CM | POA: Diagnosis not present

## 2021-10-16 NOTE — Progress Notes (Signed)
Established patient visit  I,Joseline E Rosas,acting as a scribe for Jacky Kindle, FNP.,have documented all relevant documentation on the behalf of Jacky Kindle, FNP,as directed by  Jacky Kindle, FNP while in the presence of Jacky Kindle, FNP.   [name]   DOB: 04/05/1946   76 y.o. Female  MRN: [id number] Visit Date: 10/30/2021  Today's healthcare provider: Jacky Kindle, FNP  Re-Introduced to nurse practitioner role and practice setting.  All questions answered.  Discussed provider/patient relationship and expectations.   Chief Complaint  Patient presents with   follow-up HTN   Subjective    HPI  Hypertension, follow-up  BP Readings from Last 3 Encounters:  10/30/21 (!) 165/74  09/11/21 (!) 174/70  05/15/21 (!) 160/82   Wt Readings from Last 3 Encounters:  10/30/21 189 lb 11.2 oz (86 kg)  10/01/21 188 lb (85.3 kg)  09/11/21 188 lb (85.3 kg)     She was last seen for hypertension 6 weeks ago.  BP at that visit was 174/70. Management since that visit includes Amlodipine 10mg , and Valsartan 320mg .  She reports excellent compliance with treatment. She is not having side effects.  She is following a  well balanced  diet.Trying to eat more vegetables and drink water. She is exercising.Walking 3 times per week for 15 minutes. She does not smoke.   Outside blood pressures are 169/79. Symptoms: No chest pain No chest pressure  No palpitations No syncope  No dyspnea No orthopnea  No paroxysmal nocturnal dyspnea No lower extremity edema   Pertinent labs Lab Results  Component Value Date   CHOL 211 (H) 12/31/2019   HDL 50 12/31/2019   LDLCALC 136 (H) 12/31/2019   TRIG 141 12/31/2019   CHOLHDL 4.2 12/31/2019   Lab Results  Component Value Date   NA 139 12/31/2019   K 3.9 12/31/2019   CREATININE 0.82 12/31/2019   GFRNONAA 72 12/31/2019   GLUCOSE 100 (H) 12/31/2019   TSH 1.770 12/31/2019     The 10-year ASCVD risk score (Arnett DK, et  al., 2019) is: 21%  ---------------------------------------------------------------------------------------------------   Medications: Outpatient Medications Prior to Visit  Medication Sig   amLODipine (NORVASC) 10 MG tablet Take 1 tablet (10 mg total) by mouth daily.   bisoprolol (ZEBETA) 10 MG tablet Take 1 tablet (10 mg total) by mouth daily.   fluticasone (FLONASE) 50 MCG/ACT nasal spray Place 2 sprays into both nostrils daily.   simvastatin (ZOCOR) 10 MG tablet TAKE 1 TABLET BY MOUTH  DAILY AT 6 PM   valsartan (DIOVAN) 320 MG tablet Take 1 tablet (320 mg total) by mouth daily.   Vitamin D, Ergocalciferol, (DRISDOL) 1.25 MG (50000 UNIT) CAPS capsule Take 1 capsule (50,000 Units total) by mouth every 7 (seven) days. Need Vit D lab draw for additional refills; this was filled as a courtesy.   zolpidem (AMBIEN) 5 MG tablet TAKE 1 TABLET AT BEDTIME ASNEEDED   hydrochlorothiazide (HYDRODIURIL) 25 MG tablet Take 1 tablet (25 mg total) by mouth daily. (Patient taking differently: Take 25 mg by mouth daily. Taking 1/2 tablet due to side effects)   [DISCONTINUED] amoxicillin-clavulanate (AUGMENTIN) 875-125 MG tablet Take 1 tablet by mouth 2 (two) times daily. (Patient not taking: Reported on 09/21/2021)   No facility-administered medications prior to visit.    Review of Systems  Constitutional:  Negative for appetite change, chills, fatigue and fever.  Respiratory:  Negative for chest tightness and shortness of breath.  Cardiovascular:  Negative for chest pain and palpitations.  Gastrointestinal:  Negative for abdominal pain, nausea and vomiting.  Neurological:  Negative for dizziness and weakness.    Last CBC Lab Results  Component Value Date   WBC 12.4 (H) 12/31/2019   HGB 13.1 12/31/2019   HCT 40.3 12/31/2019   MCV 86 12/31/2019   MCH 27.9 12/31/2019   RDW 12.6 12/31/2019   PLT 386 12/31/2019   Last metabolic panel Lab Results  Component Value Date   GLUCOSE 100 (H) 12/31/2019    NA 139 12/31/2019   K 3.9 12/31/2019   CL 101 12/31/2019   CO2 22 12/31/2019   BUN 10 12/31/2019   CREATININE 0.82 12/31/2019   GFRNONAA 72 12/31/2019   CALCIUM 9.7 12/31/2019   PROT 7.7 12/31/2019   ALBUMIN 4.5 12/31/2019   LABGLOB 3.2 12/31/2019   AGRATIO 1.4 12/31/2019   BILITOT 0.4 12/31/2019   ALKPHOS 102 12/31/2019   AST 17 12/31/2019   ALT 15 12/31/2019   Last lipids Lab Results  Component Value Date   CHOL 211 (H) 12/31/2019   HDL 50 12/31/2019   LDLCALC 136 (H) 12/31/2019   TRIG 141 12/31/2019   CHOLHDL 4.2 12/31/2019   Last hemoglobin A1c Lab Results  Component Value Date   HGBA1C 6.1 (H) 12/31/2019   Last thyroid functions Lab Results  Component Value Date   TSH 1.770 12/31/2019   Last vitamin D Lab Results  Component Value Date   VD25OH 20.3 (L) 10/05/2017       Objective    BP (!) 165/74 (BP Location: Left Arm, Patient Position: Sitting, Cuff Size: Large)   Pulse 68   Temp 98.2 F (36.8 C) (Oral)   Resp 16   Ht 5\' 2"  (1.575 m)   Wt 189 lb 11.2 oz (86 kg)   BMI 34.70 kg/m  BP Readings from Last 3 Encounters:  10/30/21 (!) 165/74  09/11/21 (!) 174/70  05/15/21 (!) 160/82   Wt Readings from Last 3 Encounters:  10/30/21 189 lb 11.2 oz (86 kg)  10/01/21 188 lb (85.3 kg)  09/11/21 188 lb (85.3 kg)   SpO2 Readings from Last 3 Encounters:  09/11/21 97%  05/15/21 98%  02/20/21 100%      Physical Exam Vitals and nursing note reviewed.  Constitutional:      General: She is not in acute distress.    Appearance: Normal appearance. She is obese. She is not ill-appearing, toxic-appearing or diaphoretic.  HENT:     Head: Normocephalic and atraumatic.  Cardiovascular:     Rate and Rhythm: Normal rate and regular rhythm.     Pulses: Normal pulses.     Heart sounds: Normal heart sounds. No murmur heard.    No friction rub. No gallop.  Pulmonary:     Effort: Pulmonary effort is normal. No respiratory distress.     Breath sounds: Normal  breath sounds. No stridor. No wheezing, rhonchi or rales.  Chest:     Chest wall: No tenderness.  Abdominal:     General: Bowel sounds are normal.     Palpations: Abdomen is soft.  Musculoskeletal:        General: No swelling, tenderness, deformity or signs of injury. Normal range of motion.     Right lower leg: No edema.     Left lower leg: No edema.  Skin:    General: Skin is warm and dry.     Capillary Refill: Capillary refill takes less than 2 seconds.  Coloration: Skin is not jaundiced or pale.     Findings: No bruising, erythema, lesion or rash.  Neurological:     General: No focal deficit present.     Mental Status: She is alert and oriented to person, place, and time. Mental status is at baseline.     Cranial Nerves: No cranial nerve deficit.     Sensory: No sensory deficit.     Motor: No weakness.     Coordination: Coordination normal.  Psychiatric:        Mood and Affect: Mood normal.        Behavior: Behavior normal.        Thought Content: Thought content normal.        Judgment: Judgment normal.       No results found for any visits on 10/30/21.  Assessment & Plan     Problem List Items Addressed This Visit       Cardiovascular and Mediastinum   Uncontrolled hypertension - Primary    Chronic, uncontrolled 160s/70s, pt report sometimes higher at home Recommend f/u with Dr Mariah Milling, cards or with Adv HTN clinic Patient reports currently taking -norvasc 10 mg -bisoprolol 10 mg -valsartan 320 mg, causing some soft stools -hctz 25 mg ordered; last report of 12.5 mg use; encourage use of full dose Goal <140/<90      Relevant Orders   Ambulatory referral to [facility]   Ambulatory referral to Cardiology   Comprehensive metabolic panel   CBC with Differential/Platelet   TSH + free T4     Other   Avitaminosis D    Chronically low, on Rx supplement for 5 months Repeat labs Denies symptoms      Relevant Orders    Vitamin D (25 hydroxy)   Blood glucose elevated    Chronic, previously elevated Check A1c Continue to recommend balanced, lower carb meals. Smaller meal size, adding snacks. Choosing water as drink of choice and increasing purposeful exercise.       Relevant Orders   Hemoglobin A1c   Mixed hyperlipidemia    Chronic, previously elevated Recommend LP recommend diet low in saturated fat and regular exercise - 30 min at least 5 times per week Stroke/MI risk is elevated  The 10-year ASCVD risk score (Arnett DK, et al., 2019) is: 21%   Values used to calculate the score:     Age: 69 years     Sex: Female     Is Non-Hispanic African American: Yes     Diabetic: No     Tobacco smoker: No     Systolic Blood Pressure: 165 mmHg     Is BP treated: Yes     HDL Cholesterol: 50 mg/dL     Total Cholesterol: 211 mg/dL       Relevant Orders   Lipid panel     Return in about 5 months (around 04/01/2022) for annual examination.      Leilani Merl, FNP, have reviewed all documentation for this visit. The documentation on 10/30/21 for the exam, diagnosis, procedures, and orders are all accurate and complete.    Jacky Kindle, FNP  [facility] [phone] (phone) [phone] (fax)  [facility]

## 2021-10-30 ENCOUNTER — Encounter: Payer: Self-pay | Admitting: Family Medicine

## 2021-10-30 ENCOUNTER — Ambulatory Visit (INDEPENDENT_AMBULATORY_CARE_PROVIDER_SITE_OTHER): Payer: No Typology Code available for payment source | Admitting: Family Medicine

## 2021-10-30 VITALS — BP 165/74 | HR 68 | Temp 98.2°F | Resp 16 | Ht 62.0 in | Wt 189.7 lb

## 2021-10-30 DIAGNOSIS — E782 Mixed hyperlipidemia: Secondary | ICD-10-CM | POA: Diagnosis not present

## 2021-10-30 DIAGNOSIS — E559 Vitamin D deficiency, unspecified: Secondary | ICD-10-CM

## 2021-10-30 DIAGNOSIS — R739 Hyperglycemia, unspecified: Secondary | ICD-10-CM | POA: Diagnosis not present

## 2021-10-30 DIAGNOSIS — I10 Essential (primary) hypertension: Secondary | ICD-10-CM | POA: Diagnosis not present

## 2021-10-30 NOTE — Assessment & Plan Note (Addendum)
Chronic, uncontrolled 160s/70s, pt report sometimes higher at home Recommend f/u with Dr Mariah Milling, cards or with Adv HTN clinic Patient reports currently taking -norvasc 10 mg -bisoprolol 10 mg -valsartan 320 mg, causing some soft stools -hctz 25 mg ordered; last report of 12.5 mg use; encourage use of full dose Goal <140/<90

## 2021-10-30 NOTE — Assessment & Plan Note (Signed)
Chronic, previously elevated Check A1c Continue to recommend balanced, lower carb meals. Smaller meal size, adding snacks. Choosing water as drink of choice and increasing purposeful exercise.

## 2021-10-30 NOTE — Assessment & Plan Note (Signed)
Chronic, previously elevated Recommend LP recommend diet low in saturated fat and regular exercise - 30 min at least 5 times per week Stroke/MI risk is elevated  The 10-year ASCVD risk score (Arnett DK, et al., 2019) is: 21%   Values used to calculate the score:     Age: 76 years     Sex: Female     Is Non-Hispanic African American: Yes     Diabetic: No     Tobacco smoker: No     Systolic Blood Pressure: 165 mmHg     Is BP treated: Yes     HDL Cholesterol: 50 mg/dL     Total Cholesterol: 211 mg/dL

## 2021-10-30 NOTE — Patient Instructions (Signed)
The CDC recommends two doses of Shingrix (the new shingles vaccine) separated by 2 to 6 months for adults age 74 years and older. I recommend checking with your insurance plan regarding coverage for this vaccine.    

## 2021-10-30 NOTE — Assessment & Plan Note (Signed)
Chronically low, on Rx supplement for 5 months Repeat labs Denies symptoms

## 2021-10-31 LAB — COMPREHENSIVE METABOLIC PANEL
ALT: 16 IU/L (ref 0–32)
AST: 16 IU/L (ref 0–40)
Albumin/Globulin Ratio: 1.4 (ref 1.2–2.2)
Albumin: 4.4 g/dL (ref 3.8–4.8)
Alkaline Phosphatase: 96 IU/L (ref 44–121)
BUN/Creatinine Ratio: 11 — ABNORMAL LOW (ref 12–28)
BUN: 9 mg/dL (ref 8–27)
Bilirubin Total: 0.3 mg/dL (ref 0.0–1.2)
CO2: 22 mmol/L (ref 20–29)
Calcium: 9.3 mg/dL (ref 8.7–10.3)
Chloride: 102 mmol/L (ref 96–106)
Creatinine, Ser: 0.82 mg/dL (ref 0.57–1.00)
Globulin, Total: 3.1 g/dL (ref 1.5–4.5)
Glucose: 97 mg/dL (ref 70–99)
Potassium: 3.9 mmol/L (ref 3.5–5.2)
Sodium: 139 mmol/L (ref 134–144)
Total Protein: 7.5 g/dL (ref 6.0–8.5)
eGFR: 75 mL/min/{1.73_m2} (ref >59)

## 2021-10-31 LAB — LIPID PANEL
Chol/HDL Ratio: 4 ratio (ref 0.0–4.4)
Cholesterol, Total: 183 mg/dL (ref 100–199)
HDL: 46 mg/dL (ref >39)
LDL Chol Calc (NIH): 121 mg/dL — ABNORMAL HIGH (ref 0–99)
Triglycerides: 88 mg/dL (ref 0–149)
VLDL Cholesterol Cal: 16 mg/dL (ref 5–40)

## 2021-10-31 LAB — TSH+FREE T4
Free T4: 1.19 ng/dL (ref 0.82–1.77)
TSH: 1.82 u[IU]/mL (ref 0.450–4.500)

## 2021-10-31 LAB — CBC WITH DIFFERENTIAL/PLATELET
Basophils Absolute: 0 10*3/uL (ref 0.0–0.2)
Basos: 0 %
EOS (ABSOLUTE): 0.2 10*3/uL (ref 0.0–0.4)
Eos: 2 %
Hematocrit: 38.1 % (ref 34.0–46.6)
Hemoglobin: 12.5 g/dL (ref 11.1–15.9)
Immature Grans (Abs): 0 10*3/uL (ref 0.0–0.1)
Immature Granulocytes: 0 %
Lymphocytes Absolute: 3.9 10*3/uL — ABNORMAL HIGH (ref 0.7–3.1)
Lymphs: 37 %
MCH: 28.2 pg (ref 26.6–33.0)
MCHC: 32.8 g/dL (ref 31.5–35.7)
MCV: 86 fL (ref 79–97)
Monocytes Absolute: 0.6 10*3/uL (ref 0.1–0.9)
Monocytes: 6 %
Neutrophils Absolute: 5.8 10*3/uL (ref 1.4–7.0)
Neutrophils: 55 %
Platelets: 368 10*3/uL (ref 150–450)
RBC: 4.43 x10E6/uL (ref 3.77–5.28)
RDW: 12.4 % (ref 11.7–15.4)
WBC: 10.5 10*3/uL (ref 3.4–10.8)

## 2021-10-31 LAB — VITAMIN D 25 HYDROXY (VIT D DEFICIENCY, FRACTURES): Vit D, 25-Hydroxy: 38.5 ng/mL (ref 30.0–100.0)

## 2021-10-31 LAB — HEMOGLOBIN A1C
Est. average glucose Bld gHb Est-mCnc: 123 mg/dL
Hgb A1c MFr Bld: 5.9 % — ABNORMAL HIGH (ref 4.8–5.6)

## 2021-11-03 ENCOUNTER — Telehealth: Payer: No Typology Code available for payment source

## 2021-11-03 NOTE — Progress Notes (Signed)
Normal blood chemistry. Cholesterol is slightly improved; LDL/bad cholesterol remains elevated. I recommend diet low in saturated fat and regular exercise - 30 min at least 5 times per week Normal cell counts. 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 Smaller meal size, adding snacks. Choosing water as drink of choice and increasing purposeful exercise. Vit D is improved; now in stable range. Normal thyroid levels.  Jacky Kindle, FNP  [facility] [address] [place], [place] [address] [phone] (phone) [phone] (fax) [facility]

## 2021-11-06 ENCOUNTER — Ambulatory Visit: Payer: No Typology Code available for payment source

## 2021-11-13 DIAGNOSIS — H6982 Other specified disorders of Eustachian tube, left ear: Secondary | ICD-10-CM | POA: Diagnosis not present

## 2021-11-13 DIAGNOSIS — H90A32 Mixed conductive and sensorineural hearing loss, unilateral, left ear with restricted hearing on the contralateral side: Secondary | ICD-10-CM | POA: Diagnosis not present

## 2021-11-17 ENCOUNTER — Encounter: Payer: Self-pay | Admitting: Cardiovascular Disease

## 2021-11-17 NOTE — Chronic Care Management (AMB) (Signed)
     Marissa Hahn 1946-02-07 [id number]   Documentation encounter for case transition.  The care team will continue to follow for care coordination needs.  [facility] Care Management [phone]

## 2021-11-19 ENCOUNTER — Ambulatory Visit: Payer: No Typology Code available for payment source

## 2021-11-20 ENCOUNTER — Ambulatory Visit: Payer: Self-pay

## 2021-11-20 NOTE — Patient Outreach (Addendum)
  Care Coordination   Visit Note   11/20/2021 Name: Marissa Hahn MRN: [id number] DOB: [birth year]  Marissa Hahn is a 76 y.o. year old female who sees Marissa Kindle, FNP for primary care. I spoke with  Marissa Hahn by phone today. Marissa Hahn contacted the care coordination team to request assistance with referrals and appointment scheduling.   SDOH assessments and interventions completed:  No   Care Coordination Interventions Activated:  No  Care Coordination Interventions:  No, not indicated   Follow up plan:  Will review pending referrals and appointments and follow up with Marissa Hahn tomorrow.    Encounter Outcome:  Pt. Visit Completed   [facility] Care Management [phone]

## 2021-11-23 NOTE — Patient Outreach (Signed)
  Care Coordination   Visit Note    Name: Marissa Hahn MRN: [id number] DOB: [birth year]  Marissa Hahn is a 76 y.o. year old female who sees Jacky Kindle, FNP for primary care. I spoke with  Marissa Hahn by phone today.  What matters to the patients health and wellness today?  Appointments    Goals Addressed             This Visit's Progress    Appointments       Care Coordination Interventions: Patient requested assistance with confirming all upcoming appointments.  Denies urgent concerns. She would like to discuss her current medication regimen for hypertension. Reports history of BP being well maintained with ziac and amlodipine. Reports tolerating the regimen well. She would like to discuss this with her Cardiology provider.  All pending appointments reviewed. Informed of pending appointment with the Cardiology team on 12/18/2021. Declined need for urgent outreach or messaging. She will follow up as scheduled. Requested assistance with rescheduling her mammogram and Dexscan. Will contact Outpatient Imaging and follow up this week.         SDOH assessments and interventions completed:  No   Care Coordination Interventions Activated:  Yes  Care Coordination Interventions:  Yes, provided   Follow up plan: A member of the care management team will follow up next month.  Encounter Outcome:  Pt. Visit Completed   [facility] Care Management [phone]

## 2021-12-14 ENCOUNTER — Other Ambulatory Visit: Payer: No Typology Code available for payment source

## 2021-12-18 ENCOUNTER — Ambulatory Visit: Payer: No Typology Code available for payment source | Attending: Nurse Practitioner | Admitting: Cardiology

## 2021-12-18 ENCOUNTER — Encounter: Payer: Self-pay | Admitting: Cardiology

## 2021-12-18 VITALS — BP 186/84 | HR 73 | Ht 62.0 in | Wt 190.4 lb

## 2021-12-18 DIAGNOSIS — I701 Atherosclerosis of renal artery: Secondary | ICD-10-CM | POA: Diagnosis not present

## 2021-12-18 DIAGNOSIS — I10 Essential (primary) hypertension: Secondary | ICD-10-CM

## 2021-12-18 DIAGNOSIS — E782 Mixed hyperlipidemia: Secondary | ICD-10-CM | POA: Diagnosis not present

## 2021-12-18 MED ORDER — LOSARTAN POTASSIUM 100 MG PO TABS: 100.0000 mg | ORAL_TABLET | Freq: Every day | ORAL | 3 refills | Status: DC

## 2021-12-18 MED ORDER — BISOPROLOL FUMARATE 10 MG PO TABS: 20.0000 mg | ORAL_TABLET | Freq: Every day | ORAL | 3 refills | Status: DC

## 2021-12-18 NOTE — Patient Instructions (Signed)
Medication Instructions:  CONTINUE: Losartan 100 mg daily (New refills sent to your pharmacy) INCREASE: Zebeta to 20 mg (2 tablets) daily  *If you need a refill on your cardiac medications before your next appointment, please call your pharmacy*   Lab Work: None ordered today   Testing/Procedures: Your physician has requested that you have a renal artery duplex. During this test, an ultrasound is used to evaluate blood flow to the kidneys. Allow one hour for this exam. Do not eat after midnight the day before and avoid carbonated beverages. Take your medications as you usually do. [facility]   Follow-Up: At [facility], you and your health needs are our priority.  As part of our continuing mission to provide you with exceptional heart care, we have created designated Provider Care Teams.  These Care Teams include your primary Cardiologist (physician) and Advanced Practice Providers (APPs -  Physician Assistants and Nurse Practitioners) who all work together to provide you with the care you need, when you need it.  We recommend signing up for the patient portal called "MyChart".  Sign up information is provided on this After Visit Summary.  MyChart is used to connect with patients for Virtual Visits (Telemedicine).  Patients are able to view lab/test results, encounter notes, upcoming appointments, etc.  Non-urgent messages can be sent to your provider as well.   To learn more about what you can do with MyChart, go to [email or web address].    Your next appointment:   After Renal duplex  The format for your next appointment:   In Person  Provider:   Charlsie Quest, NP

## 2021-12-18 NOTE — Progress Notes (Signed)
Cardiology Clinic Note   Patient Name: Marissa Hahn Date of Encounter: 12/18/2021  Primary Care Provider:  Jacky Kindle, FNP Primary Cardiologist:  Julien Nordmann, MD  Patient Profile    76 year old female with a past medical history of hypertension, hyperlipidemia, and elevated glucose with a hemoglobin A1c of 6.1, who is here today to follow-up on her hypertension.  Past Medical History    Past Medical History:  Diagnosis Date   Hyperlipidemia    Hypertension    Past Surgical History:  Procedure Laterality Date   ABDOMINAL HYSTERECTOMY  1990   Abdominal   CARPAL TUNNEL RELEASE Left 01/2010    Allergies  Allergies  Allergen Reactions   Acetaminophen-Codeine     History of Present Illness    76 year old female with a past medical history of hypertension, hyperlipidemia, and elevated glucose with a hemoglobin A1c of 6.1.  Last seen in clinic 05/15/2021 with continued elevated blood pressures.  Patient states she has been having an extended period of time with difficulty in controlling her blood pressure.  She just recently had blood pressure medications at that time that had reportedly been added and she had noted improvement in recent numbers.  She was started on HCTZ 25 mg daily with blood work to be repeated.  She returns clinic today without any complaints but continued elevated blood pressures.  She states unfortunately her previous valsartan 320 mg daily caused her mouth/stomach and diarrhea so she had stopped taking them and started retaking her losartan 100 mg daily that she had previously been prescribed.  She was recently also placed on HCTZ 25 mg daily but unfortunately stated that it gave her diarrhea so she stopped that on her own accord as well.  She states that she has 7 siblings and they all have high blood pressure that has been difficult to control.  She is able to take her blood pressure on occasion when she is at work as she works as a Scientist, physiological  at an office and the nurse will sometimes take her blood pressure and she has been running in the 150s and 160s.  She states that she has tried to decrease her sodium intake and has stopped eating microwaved meals.  She denies chest pain, shortness of breath, headaches, visual disturbances.  Denies any hospitalizations or visits to the emergency department.  Home Medications    Current Outpatient Medications  Medication Sig Dispense Refill   amLODipine (NORVASC) 10 MG tablet Take 1 tablet (10 mg total) by mouth daily. 90 tablet 1   losartan (COZAAR) 100 MG tablet Take 1 tablet (100 mg total) by mouth daily. 90 tablet 3   simvastatin (ZOCOR) 10 MG tablet TAKE 1 TABLET BY MOUTH  DAILY AT 6 PM 90 tablet 3   Vitamin D, Ergocalciferol, (DRISDOL) 1.25 MG (50000 UNIT) CAPS capsule Take 1 capsule (50,000 Units total) by mouth every 7 (seven) days. Need Vit D lab draw for additional refills; this was filled as a courtesy. 13 capsule 0   zolpidem (AMBIEN) 5 MG tablet TAKE 1 TABLET AT BEDTIME ASNEEDED 90 tablet 0   bisoprolol (ZEBETA) 10 MG tablet Take 2 tablets (20 mg total) by mouth daily. 180 tablet 3   fluticasone (FLONASE) 50 MCG/ACT nasal spray Place 2 sprays into both nostrils daily. (Patient not taking: Reported on 12/18/2021) 48 g 3   No current facility-administered medications for this visit.     Family History    Family History  Problem Relation  Age of Onset   Hypertension Mother    Lung cancer Father    Healthy Other    Hypertension Sister    Hypertension Sister    Hypertension Sister    Breast cancer Neg Hx    She indicated that her mother is deceased. She indicated that her father is deceased. She indicated that all of her three sisters are alive. She indicated that two of her three brothers are alive. She indicated that both of her daughters are alive. She indicated that the status of her neg hx is unknown. She indicated that her other is alive.  Social History    Social History    Socioeconomic History   Marital status: Divorced    Spouse name: Not on file   Number of children: 2   Years of education: H/S   Highest education level: 12th grade  Occupational History   Occupation: Retired  Tobacco Use   Smoking status: Never   Smokeless tobacco: Never  Vaping Use   Vaping Use: Never used  Substance and Sexual Activity   Alcohol use: Not Currently   Drug use: No   Sexual activity: Not on file  Other Topics Concern   Not on file  Social History Narrative   Not on file   Social Determinants of Health   Financial Resource Strain: Low Risk  (10/01/2021)   Overall Financial Resource Strain (CARDIA)    Difficulty of Paying Living Expenses: Not hard at all  Food Insecurity: No Food Insecurity (10/01/2021)   Hunger Vital Sign    Worried About Running Out of Food in the Last Year: Never true    Ran Out of Food in the Last Year: Never true  Transportation Needs: No Transportation Needs (10/01/2021)   PRAPARE - Administrator, Civil Service (Medical): No    Lack of Transportation (Non-Medical): No  Physical Activity: Insufficiently Active (10/01/2021)   Exercise Vital Sign    Days of Exercise per Week: 3 days    Minutes of Exercise per Session: 30 min  Stress: No Stress Concern Present (10/01/2021)   [employer] of Occupational Health - Occupational Stress Questionnaire    Feeling of Stress : Not at all  Social Connections: Moderately Isolated (10/01/2021)   Social Connection and Isolation Panel [NHANES]    Frequency of Communication with Friends and Family: More than three times a week    Frequency of Social Gatherings with Friends and Family: Once a week    Attends Religious Services: More than 4 times per year    Active Member of [employer] or Organizations: No    Attends Banker Meetings: Never    Marital Status: Divorced  Catering manager Violence: Not At Risk (10/01/2021)   Humiliation, Afraid, Rape, and Kick questionnaire     Fear of Current or Ex-Partner: No    Emotionally Abused: No    Physically Abused: No    Sexually Abused: No     Review of Systems    General:  No chills, fever, night sweats or weight changes.  Cardiovascular:  No chest pain, dyspnea on exertion, edema, orthopnea, palpitations, paroxysmal nocturnal dyspnea. Dermatological: No rash, lesions/masses Respiratory: No cough, dyspnea Urologic: No hematuria, dysuria Abdominal:   No nausea, vomiting, diarrhea, bright red blood per rectum, melena, or hematemesis Neurologic:  No visual changes, wkns, changes in mental status. All other systems reviewed and are otherwise negative except as noted above.     Physical Exam    VS:  BP (!) 186/84 (BP Location: Right Arm, Patient Position: Sitting)   Pulse 73   Ht 5\' 2"  (1.575 m)   Wt 190 lb 6.4 oz (86.4 kg)   SpO2 98%   BMI 34.82 kg/m  , BMI Body mass index is 34.82 kg/m.     Vitals:   12/18/21 1519 12/18/21 1551  BP: (!) 192/84 (!) 186/84    GEN: Well nourished, well developed, in no acute distress. HEENT: normal. Neck: Supple, no JVD, carotid bruits, or masses. Cardiac: RRR, no murmurs, rubs, or gallops. No clubbing, cyanosis, trace pertibial edema.  Radials/DP/PT 2+ and equal bilaterally.  Respiratory:  Respirations regular and unlabored, clear to auscultation bilaterally. GI: Soft, nontender, nondistended, BS + x 4. MS: no deformity or atrophy. Skin: warm and dry, no rash. Neuro:  Strength and sensation are intact. Psych: Normal affect.  Accessory Clinical Findings    ECG personally reviewed by me today-sinus rhythm with a rate of 73, LVH, left axis deviation- No acute changes  Lab Results  Component Value Date   WBC 10.5 10/30/2021   HGB 12.5 10/30/2021   HCT 38.1 10/30/2021   MCV 86 10/30/2021   PLT 368 10/30/2021   Lab Results  Component Value Date   CREATININE 0.82 10/30/2021   BUN 9 10/30/2021   NA 139 10/30/2021   K 3.9 10/30/2021   CL 102 10/30/2021   CO2 22  10/30/2021   Lab Results  Component Value Date   ALT 16 10/30/2021   AST 16 10/30/2021   ALKPHOS 96 10/30/2021   BILITOT 0.3 10/30/2021   Lab Results  Component Value Date   CHOL 183 10/30/2021   HDL 46 10/30/2021   LDLCALC 121 (H) 10/30/2021   TRIG 88 10/30/2021   CHOLHDL 4.0 10/30/2021    Lab Results  Component Value Date   HGBA1C 5.9 (H) 10/30/2021    Assessment & Plan   1.  Essential hypertension with blood pressure today 192/84 with a repeat of 186/84.  Patient continues to report a strong family history of 7 siblings and her mother with hypertension requiring multiple medications.  She states that she has tried to decrease her dietary intake of sodium and has decreased the amount of microwave meals that she has been eating.  She does continue to have her blood pressure checked often at work.  She was previously prescribed 25 mg of HCTZ which she did not tolerate and subsequently stopped on her own she stated that she did tell her PCP at that time.  She also was unable to tolerate the valsartan and went back to losartan 100 mg daily which she states that she has been taking for the past several weeks and is requesting a refill on that medication today.  Her buspirone is being increased to 20 mg daily from 10 mg daily and along with restarting her losartan 100 mg daily.  She states she just recently had blood work completed by her primary care provider.  So at this time we will hold off for renal and aldosterone lab work but sent her for a renal artery ultrasound to rule out renal artery stenosis.  We did discuss potentially starting her on another agent such as hydralazine and will reconsider at her return appointment depending on what her pressures are at that time.  2.  Hyperlipidemia with LDL of 121 done 10/30/2021.  Statin improved from previous draw with LDL of 136.  Recommend changing simvastatin to rosuvastatin.  This is followed by PCP.  3.  Disposition patient return to  clinic to see MD/APP after renal ultrasound has been completed.  At that time we will revisit renal and aldosterone lab work.  Siriah Treat, NP 12/18/2021, 3:54 PM

## 2021-12-25 ENCOUNTER — Ambulatory Visit: Payer: Self-pay

## 2021-12-25 NOTE — Patient Outreach (Signed)
  Care Coordination   12/25/2021 Name: Marissa Hahn MRN: [id number] DOB: 12/08/1945   Care Coordination Outreach Attempts:  An unsuccessful telephone outreach was attempted for a scheduled appointment today.  Follow Up Plan:  Additional outreach attempts will be made to offer the patient care coordination information and services.   Encounter Outcome:  No Answer  Care Coordination Interventions Activated:  No   Care Coordination Interventions:  No, not indicated    [facility] Care Management [phone]

## 2022-01-01 DIAGNOSIS — H6983 Other specified disorders of Eustachian tube, bilateral: Secondary | ICD-10-CM | POA: Diagnosis not present

## 2022-01-06 ENCOUNTER — Other Ambulatory Visit: Payer: Self-pay | Admitting: Family Medicine

## 2022-01-06 DIAGNOSIS — G4700 Insomnia, unspecified: Secondary | ICD-10-CM

## 2022-01-14 ENCOUNTER — Other Ambulatory Visit: Payer: No Typology Code available for payment source

## 2022-02-04 ENCOUNTER — Other Ambulatory Visit: Payer: No Typology Code available for payment source

## 2022-02-05 ENCOUNTER — Ambulatory Visit: Payer: No Typology Code available for payment source | Admitting: Cardiology

## 2022-02-25 ENCOUNTER — Other Ambulatory Visit: Payer: No Typology Code available for payment source

## 2022-03-26 ENCOUNTER — Encounter: Payer: No Typology Code available for payment source | Admitting: Family Medicine

## 2022-04-08 ENCOUNTER — Encounter: Payer: Self-pay | Admitting: Family Medicine

## 2022-04-08 ENCOUNTER — Ambulatory Visit (INDEPENDENT_AMBULATORY_CARE_PROVIDER_SITE_OTHER): Payer: No Typology Code available for payment source | Admitting: Family Medicine

## 2022-04-08 ENCOUNTER — Encounter: Payer: No Typology Code available for payment source | Admitting: Family Medicine

## 2022-04-08 VITALS — BP 172/73 | HR 70 | Temp 97.9°F | Wt 191.8 lb

## 2022-04-08 DIAGNOSIS — Z23 Encounter for immunization: Secondary | ICD-10-CM | POA: Diagnosis not present

## 2022-04-08 DIAGNOSIS — E7800 Pure hypercholesterolemia, unspecified: Secondary | ICD-10-CM | POA: Diagnosis not present

## 2022-04-08 DIAGNOSIS — I10 Essential (primary) hypertension: Secondary | ICD-10-CM

## 2022-04-08 DIAGNOSIS — F5101 Primary insomnia: Secondary | ICD-10-CM | POA: Diagnosis not present

## 2022-04-08 DIAGNOSIS — R7303 Prediabetes: Secondary | ICD-10-CM

## 2022-04-08 DIAGNOSIS — Z0000 Encounter for general adult medical examination without abnormal findings: Secondary | ICD-10-CM | POA: Insufficient documentation

## 2022-04-08 DIAGNOSIS — I701 Atherosclerosis of renal artery: Secondary | ICD-10-CM | POA: Diagnosis not present

## 2022-04-08 DIAGNOSIS — E559 Vitamin D deficiency, unspecified: Secondary | ICD-10-CM

## 2022-04-08 MED ORDER — ZOLPIDEM TARTRATE 10 MG PO TABS: 5.0000 mg | ORAL_TABLET | Freq: Every evening | ORAL | 0 refills | Status: DC | PRN

## 2022-04-08 NOTE — Assessment & Plan Note (Signed)
Chronic insomnia; OK for self pay for ambien to assist Discussed pay options and use of discount; recommend 10 mg and 5 mg dose PRN via cutting the tablet to assist RTC in 6 months

## 2022-04-08 NOTE — Assessment & Plan Note (Signed)
Currently untreated; recommend referral to vascular Patient wishes to defer until she completes her additional lab work

## 2022-04-08 NOTE — Assessment & Plan Note (Signed)
UTD on dental and vision Things to do to keep yourself healthy  - Exercise at least 30-45 minutes a day, 3-4 days a week.  - Eat a low-fat diet with lots of fruits and vegetables, up to 7-9 servings per day.  - Seatbelts can save your life. Wear them always.  - Smoke detectors on every level of your home, check batteries every year.  - Eye Doctor - have an eye exam every 1-2 years  - Safe sex - if you may be exposed to STDs, use a condom.  - Alcohol -  If you drink, do it moderately, less than 2 drinks per day.  - Health Care Power of Attorney. Choose someone to speak for you if you are not able.  - Depression is common in our stressful world.If you're feeling down or losing interest in things you normally enjoy, please come in for a visit.  - Violence - If anyone is threatening or hurting you, please call immediately.  

## 2022-04-08 NOTE — Assessment & Plan Note (Signed)
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

## 2022-04-08 NOTE — Assessment & Plan Note (Signed)
Chronic, stable Previously 5.9% Repeat A1c Continue to recommend balanced, lower carb meals. Smaller meal size, adding snacks. Choosing water as drink of choice and increasing purposeful exercise. Controlled previously with diet/exercise

## 2022-04-08 NOTE — Assessment & Plan Note (Signed)
Chronic; borderline low 5 months ago- now on supplementation Repeat labs

## 2022-04-08 NOTE — Assessment & Plan Note (Addendum)
Chronic, elevated previously at 121 Discussed change to crestor to assist if remains elevated from zocor 10 mg

## 2022-04-08 NOTE — Progress Notes (Signed)
I,Connie R Striblin,acting as a Education administrator for Gwyneth Sprout, FNP.,have documented all relevant documentation on the behalf of Gwyneth Sprout, FNP,as directed by  Gwyneth Sprout, FNP while in the presence of Gwyneth Sprout, FNP.  Complete physical exam  [name]   DOB: 09/29/1945   76 y.o. Female  MRN: [id number] Visit Date: 04/08/2022  Today's healthcare provider: Gwyneth Sprout, FNP  Re Introduced to nurse practitioner role and practice setting.  All questions answered.  Discussed provider/patient relationship and expectations.  Subjective    Marissa Hahn is a 76 y.o. female who presents today for a complete physical exam.  She reports consuming a general diet. The patient does not participate in regular exercise at present. She generally feels well. She reports sleeping poorly. She does not have additional problems to discuss today.   HPI   Past Medical History:  Diagnosis Date   Hyperlipidemia    Hypertension    Past Surgical History:  Procedure Laterality Date   ABDOMINAL HYSTERECTOMY  1990   Abdominal   CARPAL TUNNEL RELEASE Left 01/2010   Social History   Socioeconomic History   Marital status: Divorced    Spouse name: Not on file   Number of children: 2   Years of education: H/S   Highest education level: 12th grade  Occupational History   Occupation: Retired  Tobacco Use   Smoking status: Never   Smokeless tobacco: Never  Vaping Use   Vaping Use: Never used  Substance and Sexual Activity   Alcohol use: Not Currently   Drug use: No   Sexual activity: Not on file  Other Topics Concern   Not on file  Social History Narrative   Not on file   Social Determinants of Health   Financial Resource Strain: Low Risk  (10/01/2021)   Overall Financial Resource Strain (CARDIA)    Difficulty of Paying Living Expenses: Not hard at all  Food Insecurity: No Food Insecurity (10/01/2021)   Hunger Vital Sign    Worried About Running Out of Food in the Last  Year: Never true    [place] in the Last Year: Never true  Transportation Needs: No Transportation Needs (10/01/2021)   PRAPARE - Hydrologist (Medical): No    Lack of Transportation (Non-Medical): No  Physical Activity: Insufficiently Active (10/01/2021)   Exercise Vital Sign    Days of Exercise per Week: 3 days    Minutes of Exercise per Session: 30 min  Stress: No Stress Concern Present (10/01/2021)   [place]    Feeling of Stress : Not at all  Social Connections: Moderately Isolated (10/01/2021)   Social Connection and Isolation Panel [NHANES]    Frequency of Communication with Friends and Family: More than three times a week    Frequency of Social Gatherings with Friends and Family: Once a week    Attends Religious Services: More than 4 times per year    Active Member of [employer] or Organizations: No    Attends Archivist Meetings: Never    Marital Status: Divorced  Human resources officer Violence: Not At Risk (10/01/2021)   Humiliation, Afraid, Rape, and Kick questionnaire    Fear of Current or Ex-Partner: No    Emotionally Abused: No    Physically Abused: No    Sexually Abused: No   Family Status  Relation Name Status   Mother  Deceased  Father  Deceased at age Mid 19's       [place]   Other 28 Siblings Alive   Sister  Web designer  Deceased   Daughter  Alive   Sister  Alive   Sister  Alive   Brother  Alive   Brother  Alive   Daughter  Alive   Neg Hx  (Not Specified)   Family History  Problem Relation Age of Onset   Hypertension Mother    Lung cancer Father    Healthy Other    Hypertension Sister    Hypertension Sister    Hypertension Sister    Breast cancer Neg Hx    Allergies  Allergen Reactions   Acetaminophen-Codeine     Patient Care Team: Gwyneth Sprout, FNP as PCP - General (Family Medicine) Rockey Situ, Kathlene November, MD as PCP - Cardiology  (Cardiology) Pa, [place], [place], RN as Case Manager   Medications: Outpatient Medications Prior to Visit  Medication Sig   amLODipine (NORVASC) 10 MG tablet Take 1 tablet (10 mg total) by mouth daily.   bisoprolol (ZEBETA) 10 MG tablet Take 2 tablets (20 mg total) by mouth daily.   fluticasone (FLONASE) 50 MCG/ACT nasal spray Place 2 sprays into both nostrils daily.   losartan (COZAAR) 100 MG tablet Take 1 tablet (100 mg total) by mouth daily.   simvastatin (ZOCOR) 10 MG tablet TAKE 1 TABLET BY MOUTH  DAILY AT 6 PM   Vitamin D, Ergocalciferol, (DRISDOL) 1.25 MG (50000 UNIT) CAPS capsule Take 1 capsule (50,000 Units total) by mouth every 7 (seven) days. Need Vit D lab draw for additional refills; this was filled as a courtesy.   [DISCONTINUED] zolpidem (AMBIEN) 5 MG tablet TAKE 1 TABLET BY MOUTH EVERY DAY AT BEDTIME AS NEEDED (Patient not taking: Reported on 04/08/2022)   No facility-administered medications prior to visit.   Review of Systems  Last CBC Lab Results  Component Value Date   WBC 10.5 10/30/2021   HGB 12.5 10/30/2021   HCT 38.1 10/30/2021   MCV 86 10/30/2021   MCH 28.2 10/30/2021   RDW 12.4 10/30/2021   PLT 368 20/35/5974   Last metabolic panel Lab Results  Component Value Date   GLUCOSE 97 10/30/2021   NA 139 10/30/2021   K 3.9 10/30/2021   CL 102 10/30/2021   CO2 22 10/30/2021   BUN 9 10/30/2021   CREATININE 0.82 10/30/2021   EGFR 75 10/30/2021   CALCIUM 9.3 10/30/2021   PROT 7.5 10/30/2021   ALBUMIN 4.4 10/30/2021   LABGLOB 3.1 10/30/2021   AGRATIO 1.4 10/30/2021   BILITOT 0.3 10/30/2021   ALKPHOS 96 10/30/2021   AST 16 10/30/2021   ALT 16 10/30/2021   Last lipids Lab Results  Component Value Date   CHOL 183 10/30/2021   HDL 46 10/30/2021   LDLCALC 121 (H) 10/30/2021   TRIG 88 10/30/2021   CHOLHDL 4.0 10/30/2021   Last hemoglobin A1c Lab Results  Component Value Date   HGBA1C 5.9 (H) 10/30/2021   Last thyroid  functions Lab Results  Component Value Date   TSH 1.820 10/30/2021   Last vitamin D Lab Results  Component Value Date   VD25OH 38.5 10/30/2021    Objective    BP (!) 172/73 (BP Location: Left Arm, Patient Position: Sitting, Cuff Size: Large)   Pulse 70   Temp 97.9 F (36.6 C) (Oral)   Wt 191 lb 12.8 oz (87 kg)   SpO2  96%   BMI 35.08 kg/m   BP Readings from Last 3 Encounters:  04/08/22 (!) 172/73  12/18/21 (!) 186/84  10/30/21 (!) 165/74   Wt Readings from Last 3 Encounters:  04/08/22 191 lb 12.8 oz (87 kg)  12/18/21 190 lb 6.4 oz (86.4 kg)  10/30/21 189 lb 11.2 oz (86 kg)   SpO2 Readings from Last 3 Encounters:  04/08/22 96%  12/18/21 98%  09/11/21 97%   Physical Exam Vitals and nursing note reviewed.  Constitutional:      General: She is awake. She is not in acute distress.    Appearance: Normal appearance. She is well-developed and well-groomed. She is obese. She is not ill-appearing, toxic-appearing or diaphoretic.  HENT:     Head: Normocephalic and atraumatic.     Jaw: There is normal jaw occlusion. No trismus, tenderness, swelling or pain on movement.     Right Ear: Hearing, tympanic membrane, ear canal and external ear normal. There is no impacted cerumen.     Left Ear: Hearing, tympanic membrane, ear canal and external ear normal. There is no impacted cerumen.     Nose: Nose normal. No congestion or rhinorrhea.     Right Turbinates: Not enlarged, swollen or pale.     Left Turbinates: Not enlarged, swollen or pale.     Right Sinus: No maxillary sinus tenderness or frontal sinus tenderness.     Left Sinus: No maxillary sinus tenderness or frontal sinus tenderness.     Mouth/Throat:     Lips: Pink.     Mouth: Mucous membranes are moist. No injury.     Tongue: No lesions.     Pharynx: Oropharynx is clear. Uvula midline. No pharyngeal swelling, oropharyngeal exudate, posterior oropharyngeal erythema or uvula swelling.     Tonsils: No tonsillar exudate or  tonsillar abscesses.  Eyes:     General: Lids are normal. Lids are everted, no foreign bodies appreciated. Vision grossly intact. Gaze aligned appropriately. No allergic shiner or visual field deficit.       Right eye: No discharge.        Left eye: No discharge.     Extraocular Movements: Extraocular movements intact.     Conjunctiva/sclera: Conjunctivae normal.     Right eye: Right conjunctiva is not injected. No exudate.    Left eye: Left conjunctiva is not injected. No exudate.    Pupils: Pupils are equal, round, and reactive to light.  Neck:     Thyroid: No thyroid mass, thyromegaly or thyroid tenderness.     Vascular: No carotid bruit.     Trachea: Trachea normal.  Cardiovascular:     Rate and Rhythm: Normal rate and regular rhythm.     Pulses: Normal pulses.          Carotid pulses are 2+ on the right side and 2+ on the left side.      Radial pulses are 2+ on the right side and 2+ on the left side.       Dorsalis pedis pulses are 2+ on the right side and 2+ on the left side.       Posterior tibial pulses are 2+ on the right side and 2+ on the left side.     Heart sounds: Normal heart sounds, S1 normal and S2 normal. No murmur heard.    No friction rub. No gallop.  Pulmonary:     Effort: Pulmonary effort is normal. No respiratory distress.     Breath sounds: Normal breath sounds and  air entry. No stridor. No wheezing, rhonchi or rales.  Chest:     Chest wall: No tenderness.     Comments: Breasts: risk and benefit of breast self-exam was discussed, not examined  Abdominal:     General: Abdomen is flat. Bowel sounds are normal. There is no distension.     Palpations: Abdomen is soft. There is no mass.     Tenderness: There is no abdominal tenderness. There is no right CVA tenderness, left CVA tenderness, guarding or rebound.     Hernia: No hernia is present.  Genitourinary:    Comments: Exam deferred; denies complaints Musculoskeletal:        General: No swelling,  tenderness, deformity or signs of injury. Normal range of motion.     Cervical back: Full passive range of motion without pain, normal range of motion and neck supple. No edema, rigidity or tenderness. No muscular tenderness.     Right lower leg: No edema.     Left lower leg: No edema.  Lymphadenopathy:     Cervical: No cervical adenopathy.     Right cervical: No superficial, deep or posterior cervical adenopathy.    Left cervical: No superficial, deep or posterior cervical adenopathy.  Skin:    General: Skin is warm and dry.     Capillary Refill: Capillary refill takes less than 2 seconds.     Coloration: Skin is not jaundiced or pale.     Findings: No bruising, erythema, lesion or rash.  Neurological:     General: No focal deficit present.     Mental Status: She is alert and oriented to person, place, and time. Mental status is at baseline.     GCS: GCS eye subscore is 4. GCS verbal subscore is 5. GCS motor subscore is 6.     Sensory: Sensation is intact. No sensory deficit.     Motor: Motor function is intact. No weakness.     Coordination: Coordination is intact. Coordination normal.     Gait: Gait is intact. Gait normal.  Psychiatric:        Attention and Perception: Attention and perception normal.        Mood and Affect: Mood and affect normal.        Speech: Speech normal.        Behavior: Behavior normal. Behavior is cooperative.        Thought Content: Thought content normal.        Cognition and Memory: Cognition and memory normal.        Judgment: Judgment normal.    Last depression screening scores    04/08/2022    4:12 PM 10/01/2021    3:30 PM 09/11/2021    1:52 PM  PHQ 2/9 Scores  PHQ - 2 Score 1 0 0  PHQ- 9 Score _0 Last fall risk screening    10/01/2021    3:34 PM  [place] in the past year? 0  Number falls in past yr: 0  Injury with Fall? 0  Risk for fall due to : No Fall Risks  Follow up Falls evaluation completed   Last Audit-C  alcohol use screening    04/08/2022    4:13 PM  Alcohol Use Disorder Test (AUDIT)  1. How often do you have a drink containing alcohol? 0   A score of 3 or more in women, and 4 or more in men indicates increased risk for alcohol abuse, EXCEPT if all of  the points are from question 1   No results found for any visits on 04/08/22.  Assessment & Plan    Routine Health Maintenance and Physical Exam  Exercise Activities and Dietary recommendations  Goals      Appointments     Care Coordination Interventions: Patient requested assistance with confirming all upcoming appointments.  Denies urgent concerns. She would like to discuss her current medication regimen for hypertension. Reports history of BP being well maintained with ziac and amlodipine. Reports tolerating the regimen well. She would like to discuss this with her Cardiology provider.  All pending appointments reviewed. Informed of pending appointment with the Cardiology team on 12/18/2021. Declined need for urgent outreach or messaging. She will follow up as scheduled. Requested assistance with rescheduling her mammogram and Dexscan. Will contact Outpatient Imaging and follow up this week.      DIET - EAT MORE FRUITS AND VEGETABLES     Exercise 3x per week (30 min per time)     Recommend to start walking 3 days a week for 30 minutes.         Immunization History  Administered Date(s) Administered   Fluad Quad(high Dose 65+) 02/20/2021, 04/08/2022   Influenza-Unspecified 03/05/2019   Moderna Sars-Covid-2 Vaccination 05/23/2019, 06/20/2019   Pneumococcal Conjugate-13 08/24/2013   Pneumococcal Polysaccharide-23 03/25/2015    Health Maintenance  Topic Date Due   DTaP/Tdap/Td (1 - Tdap) Never done   Zoster Vaccines- Shingrix (1 of 2) Never done   DEXA SCAN  12/09/2018   MAMMOGRAM  05/01/2021   COVID-19 Vaccine (3 - 2023-24 season) 04/24/2022 (Originally 12/18/2021)   COLONOSCOPY (Pts 45-54yr Insurance coverage will need  to be confirmed)  04/19/2026 (Originally 08/01/2016)   Medicare Annual Wellness (AWV)  10/02/2022   Pneumonia Vaccine 74 Years old  Completed   INFLUENZA VACCINE  Completed   Hepatitis C Screening  Completed   HPV VACCINES  Aged Out    Discussed health benefits of physical activity, and encouraged her to engage in regular exercise appropriate for her age and condition.  Problem List Items Addressed This Visit       Cardiovascular and Mediastinum   Renal artery stenosis (HCC)    Currently untreated; recommend referral to vascular Patient wishes to defer until she completes her additional lab work      Relevant Orders   Comprehensive Metabolic Panel (CMET)   CBC   Lipid panel   Hemoglobin A1c   Uncontrolled hypertension    BP uncontrolled; has seen cardiology to assist Concern for RAS; currently untreated BP goal of <140/<90 Currently on Norvasc 10 mg, Zebeta 20 mg, Losartan 100 mg Encourage follow up for renin and aldosterone labs with cardiology given cancelled appt       Relevant Orders   Comprehensive Metabolic Panel (CMET)   CBC   Lipid panel   Hemoglobin A1c     Other   Annual physical exam - Primary    UTD on dental and vision Things to do to keep yourself healthy  - Exercise at least 30-45 minutes a day, 3-4 days a week.  - Eat a low-fat diet with lots of fruits and vegetables, up to 7-9 servings per day.  - Seatbelts can save your life. Wear them always.  - Smoke detectors on every level of your home, check batteries every year.  - Eye Doctor - have an eye exam every 1-2 years  - Safe sex - if you may be exposed to STDs, use a condom.  -  Alcohol -  If you drink, do it moderately, less than 2 drinks per day.  - [place]. Choose someone to speak for you if you are not able.  - Depression is common in our stressful world.If you're feeling down or losing interest in things you normally enjoy, please come in for a visit.  - Violence - If  anyone is threatening or hurting you, please call immediately.       Relevant Orders   Comprehensive Metabolic Panel (CMET)   CBC   Lipid panel   Hemoglobin A1c   Vitamin D (25 hydroxy)   Avitaminosis D    Chronic; borderline low 5 months ago- now on supplementation Repeat labs       Relevant Orders   Vitamin D (25 hydroxy)   Cannot sleep    Chronic insomnia; OK for self pay for ambien to assist Discussed pay options and use of discount; recommend 10 mg and 5 mg dose PRN via cutting the tablet to assist RTC in 6 months       Relevant Medications   zolpidem (AMBIEN) 10 MG tablet   Elevated LDL cholesterol level    Chronic, elevated previously at 121 Discussed change to crestor to assist if remains elevated from zocor 10 mg       Relevant Orders   Comprehensive Metabolic Panel (CMET)   Lipid panel   Need for immunization against influenza   Relevant Orders   Flu Vaccine QUAD High Dose(Fluad) (Completed)   Prediabetes    Chronic, stable Previously 5.9% Repeat A1c Continue to recommend balanced, lower carb meals. Smaller meal size, adding snacks. Choosing water as drink of choice and increasing purposeful exercise. Controlled previously with diet/exercise       Relevant Orders   Hemoglobin A1c   Return in about 6 months (around 10/08/2022) for chonic disease management.    Vonna Kotyk, FNP, have reviewed all documentation for this visit. The documentation on 04/08/22 for the exam, diagnosis, procedures, and orders are all accurate and complete.  Gwyneth Sprout, [place] [phone] (phone) [phone] (fax)  [place]

## 2022-04-08 NOTE — Patient Instructions (Addendum)
The CDC recommends two doses of Shingrix (the new shingles vaccine) separated by 2 to 6 months for adults age 74 years and older. I recommend checking with your insurance plan regarding coverage for this vaccine.    RSV also available at pharmacy.

## 2022-04-09 ENCOUNTER — Other Ambulatory Visit: Payer: Self-pay | Admitting: Family Medicine

## 2022-04-09 DIAGNOSIS — I10 Essential (primary) hypertension: Secondary | ICD-10-CM

## 2022-04-09 DIAGNOSIS — I701 Atherosclerosis of renal artery: Secondary | ICD-10-CM

## 2022-04-09 LAB — COMPREHENSIVE METABOLIC PANEL
ALT: 9 IU/L (ref 0–32)
AST: 14 IU/L (ref 0–40)
Albumin/Globulin Ratio: 1.5 (ref 1.2–2.2)
Albumin: 4.6 g/dL (ref 3.8–4.8)
Alkaline Phosphatase: 124 IU/L — ABNORMAL HIGH (ref 44–121)
BUN/Creatinine Ratio: 8 — ABNORMAL LOW (ref 12–28)
BUN: 6 mg/dL — ABNORMAL LOW (ref 8–27)
Bilirubin Total: 0.5 mg/dL (ref 0.0–1.2)
CO2: 22 mmol/L (ref 20–29)
Calcium: 9.8 mg/dL (ref 8.7–10.3)
Chloride: 102 mmol/L (ref 96–106)
Creatinine, Ser: 0.75 mg/dL (ref 0.57–1.00)
Globulin, Total: 3.1 g/dL (ref 1.5–4.5)
Glucose: 102 mg/dL — ABNORMAL HIGH (ref 70–99)
Potassium: 3.8 mmol/L (ref 3.5–5.2)
Sodium: 140 mmol/L (ref 134–144)
Total Protein: 7.7 g/dL (ref 6.0–8.5)
eGFR: 83 mL/min/{1.73_m2} (ref >59)

## 2022-04-09 LAB — CBC
Hematocrit: 41.6 % (ref 34.0–46.6)
Hemoglobin: 13.5 g/dL (ref 11.1–15.9)
MCH: 27.6 pg (ref 26.6–33.0)
MCHC: 32.5 g/dL (ref 31.5–35.7)
MCV: 85 fL (ref 79–97)
Platelets: 367 10*3/uL (ref 150–450)
RBC: 4.9 x10E6/uL (ref 3.77–5.28)
RDW: 12.5 % (ref 11.7–15.4)
WBC: 10.5 10*3/uL (ref 3.4–10.8)

## 2022-04-09 LAB — HEMOGLOBIN A1C
Est. average glucose Bld gHb Est-mCnc: 128 mg/dL
Hgb A1c MFr Bld: 6.1 % — ABNORMAL HIGH (ref 4.8–5.6)

## 2022-04-09 LAB — LIPID PANEL
Chol/HDL Ratio: 3.8 ratio (ref 0.0–4.4)
Cholesterol, Total: 187 mg/dL (ref 100–199)
HDL: 49 mg/dL (ref >39)
LDL Chol Calc (NIH): 116 mg/dL — ABNORMAL HIGH (ref 0–99)
Triglycerides: 122 mg/dL (ref 0–149)
VLDL Cholesterol Cal: 22 mg/dL (ref 5–40)

## 2022-04-09 LAB — VITAMIN D 25 HYDROXY (VIT D DEFICIENCY, FRACTURES): Vit D, 25-Hydroxy: 38.1 ng/mL (ref 30.0–100.0)

## 2022-04-09 MED ORDER — ROSUVASTATIN CALCIUM 20 MG PO TABS: 20.0000 mg | ORAL_TABLET | Freq: Every day | ORAL | 3 refills | Status: DC

## 2022-04-09 NOTE — Progress Notes (Signed)
Labs remain relatively stable; elevation in LDL/bad cholesterol remains as down pre-diabetes. Recommend increase to Crestor as discussed with cardiology NP, Cordelia Pen to assist in risk reduction of heart attack and/or stroke. Continue to recommend follow up with vascular cardiology regarding renal stents.

## 2022-04-28 ENCOUNTER — Other Ambulatory Visit: Payer: Self-pay | Admitting: Cardiovascular Disease

## 2022-05-18 ENCOUNTER — Ambulatory Visit
Admission: RE | Admit: 2022-05-18 | Discharge: 2022-05-18 | Disposition: A | Discharge status: Home / Self Care | Payer: No Typology Code available for payment source | Source: Ambulatory Visit | Attending: Family Medicine | Admitting: Family Medicine

## 2022-05-18 DIAGNOSIS — Z780 Asymptomatic menopausal state: Secondary | ICD-10-CM

## 2022-05-18 DIAGNOSIS — Z1231 Encounter for screening mammogram for malignant neoplasm of breast: Secondary | ICD-10-CM | POA: Diagnosis not present

## 2022-05-18 DIAGNOSIS — M8589 Other specified disorders of bone density and structure, multiple sites: Secondary | ICD-10-CM | POA: Diagnosis not present

## 2022-05-18 NOTE — Progress Notes (Signed)
Osteopenia, decreased bone strength noted. Can use Vit D 800 IU and Calcium 1200 mg to assist regular exercise for bone health. Can repeat DEXA in 3-5 years if desired. 

## 2022-05-19 ENCOUNTER — Telehealth: Payer: Self-pay

## 2022-05-19 NOTE — Telephone Encounter (Signed)
Pt given Dexascan results per notes of E. Rollene Rotunda FNP  on 05/19/22. Pt verbalized understanding. Pt was taking both simvastatin and rosuvastatin. Advised rosuvastatin was discontinued and she is to take Simvastatin per order. Pt is already on 50,000 units of Vitamin D.  Current med list does not have the simvastatin listed.  Please verify Vitamin D .

## 2022-05-20 NOTE — Progress Notes (Signed)
Hi Yetzali  Normal mammogram; repeat in 1 year.  Please let us know if you have any questions.  Thank you,  Tally Joe, FNP

## 2022-05-20 NOTE — Telephone Encounter (Signed)
Called pt left VM to call back.  KP 

## 2022-05-20 NOTE — Telephone Encounter (Signed)
Patient called, left VM to return the call to the office for a message from the provider. 

## 2022-05-21 NOTE — Telephone Encounter (Signed)
Patient called, left VM to return the call to the office. 

## 2022-06-02 ENCOUNTER — Encounter: Payer: Self-pay | Admitting: Cardiovascular Disease

## 2022-06-02 ENCOUNTER — Ambulatory Visit
Payer: No Typology Code available for payment source | Attending: Cardiovascular Disease | Admitting: Cardiovascular Disease

## 2022-06-02 VITALS — BP 158/64 | HR 58 | Ht 62.0 in | Wt 190.0 lb

## 2022-06-02 DIAGNOSIS — R7303 Prediabetes: Secondary | ICD-10-CM

## 2022-06-02 DIAGNOSIS — E7800 Pure hypercholesterolemia, unspecified: Secondary | ICD-10-CM

## 2022-06-02 DIAGNOSIS — I10 Essential (primary) hypertension: Secondary | ICD-10-CM

## 2022-06-02 MED ORDER — DOXAZOSIN MESYLATE 1 MG PO TABS: 1.0000 mg | ORAL_TABLET | Freq: Two times a day (BID) | ORAL | 3 refills | Status: DC

## 2022-06-02 NOTE — Progress Notes (Signed)
Cardiology Office Note  Date:  06/02/2022   ID:  Marissa Hahn, DOB 1945-11-25, MRN [id number]  PCP:  Gwyneth Sprout, FNP   Chief Complaint  Patient presents with   Follow up HTN     "Doing well." Medications reviewed by the patient verbally.     HPI:  [name] is a 77 year old woman with past medical history of Hypertension Elevated glucose A1c 6.1  hyperlipidemia Who presents for follow-up of her hypertension  Last seen by myself in clinic January 2023 Seen by one of our providers September 2023 On that visit blood pressure 186/84  She had stopped taking valsartan and change back to losartan 100 Stopped HCTZ secondary to diarrhea  Renal artery ultrasound was ordered on prior clinic visit, this has not been completed  In follow-up today she reports that she feels well with no complaints Tolerating amlodipine 10 daily, bisoprolol 10 twice daily, losartan 100 daily Recent change to Crestor 20, tolerating so far Denies significant leg swelling  Blood pressure at home typically [id number] systolic Feels the numbers are good in comparison to prior numbers  Mom had HTN, required medications  EKG personally reviewed by myself on todays visit Normal sinus rhythm rate 58 bpm no significant ST-T wave changes  PMH:   has a past medical history of Hyperlipidemia and Hypertension.  PSH:    Past Surgical History:  Procedure Laterality Date   ABDOMINAL HYSTERECTOMY  1990   Abdominal   CARPAL TUNNEL RELEASE Left 01/2010    Current Outpatient Medications  Medication Sig Dispense Refill   amLODipine (NORVASC) 10 MG tablet Take 1 tablet (10 mg total) by mouth daily. 90 tablet 1   bisoprolol (ZEBETA) 10 MG tablet Take 2 tablets (20 mg total) by mouth daily. 180 tablet 3   doxazosin (CARDURA) 1 MG tablet Take 1 tablet (1 mg total) by mouth 2 (two) times daily. 180 tablet 3   fluticasone (FLONASE) 50 MCG/ACT nasal spray Place 2 sprays into both nostrils daily. 48 g 3    losartan (COZAAR) 100 MG tablet Take 1 tablet (100 mg total) by mouth daily. 90 tablet 3   rosuvastatin (CRESTOR) 20 MG tablet Take 1 tablet (20 mg total) by mouth daily. 90 tablet 3   Vitamin D, Ergocalciferol, (DRISDOL) 1.25 MG (50000 UNIT) CAPS capsule Take 1 capsule (50,000 Units total) by mouth every 7 (seven) days. Need Vit D lab draw for additional refills; this was filled as a courtesy. 13 capsule 0   zolpidem (AMBIEN) 10 MG tablet Take 0.5 tablets (5 mg total) by mouth at bedtime as needed for sleep. 90 tablet 0   No current facility-administered medications for this visit.    Allergies:   Acetaminophen-codeine   Social History:  The patient  reports that she has never smoked. She has never used smokeless tobacco. She reports that she does not currently use alcohol. She reports that she does not use drugs.   Family History:   family history includes Healthy in an other family member; Hypertension in her mother, sister, sister, and sister; Lung cancer in her father.    Review of Systems: Review of Systems  Constitutional: Negative.   HENT: Negative.    Respiratory: Negative.    Cardiovascular: Negative.   Gastrointestinal: Negative.   Musculoskeletal: Negative.   Neurological: Negative.   Psychiatric/Behavioral: Negative.    All other systems reviewed and are negative.   PHYSICAL EXAM: VS:  BP (!) 158/64 (BP Location: Right Arm, Patient Position:  Sitting, Cuff Size: Normal)   Pulse (!) 58   Ht 5' 2"$  (1.575 m)   Wt 190 lb (86.2 kg)   SpO2 98%   BMI 34.75 kg/m  , BMI Body mass index is 34.75 kg/m. Constitutional:  oriented to person, place, and time. No distress.  HENT:  Head: Grossly normal Eyes:  no discharge. No scleral icterus.  Neck: No JVD, no carotid bruits  Cardiovascular: Regular rate and rhythm, no murmurs appreciated Pulmonary/Chest: Clear to auscultation bilaterally, no wheezes or rails Abdominal: Soft.  no distension.  no tenderness.  Musculoskeletal:  Normal range of motion Neurological:  normal muscle tone. Coordination normal. No atrophy Skin: Skin warm and dry Psychiatric: normal affect, pleasant  Recent Labs: 10/30/2021: TSH 1.820 04/08/2022: ALT 9; BUN 6; Creatinine, Ser 0.75; Hemoglobin 13.5; Platelets 367; Potassium 3.8; Sodium 140    Lipid Panel Lab Results  Component Value Date   CHOL 187 04/08/2022   HDL 49 04/08/2022   LDLCALC 116 (H) 04/08/2022   TRIG 122 04/08/2022    Wt Readings from Last 3 Encounters:  06/02/22 190 lb (86.2 kg)  04/08/22 191 lb 12.8 oz (87 kg)  12/18/21 190 lb 6.4 oz (86.4 kg)     ASSESSMENT AND PLAN:  Problem List Items Addressed This Visit       Cardiology Problems   Uncontrolled hypertension - Primary   Relevant Medications   doxazosin (CARDURA) 1 MG tablet   Other Relevant Orders   EKG 12-Lead     Other   Elevated LDL cholesterol level   Prediabetes   Relevant Orders   EKG 12-Lead  Essential hypertension Tolerating amlodipine, bisoprolol, losartan Pressures continue to run high [id number] systolic Reluctant to add additional medications but discussed the importance of better blood pressure control Recommend she add Cardura 1 mg daily for 2 weeks up to 1 mg twice daily for systolic pressures over [id number] Recommended she call us with blood pressure measurements  Elevated glucose September 2021 A1c 6.1, stable  Hyperlipidemia Recent transition from simvastatin to Crestor 20  Preventive care Could consider CT coronary calcium scoring in follow-up   Total encounter time more than 30 minutes  Greater than 50% was spent in counseling and coordination of care with the patient    Signed, Esmond Plants, M.D., Ph.D. [place], [place]

## 2022-06-02 NOTE — Patient Instructions (Addendum)
Medication Instructions:  add Cardura 1 mg daily for 2 weeks  Then up to 1 mg twice daily for systolic pressures over [id number]  If you need a refill on your cardiac medications before your next appointment, please call your pharmacy.   Lab work: No new labs needed  Testing/Procedures: No new testing needed  Follow-Up: At [facility], you and your health needs are our priority.  As part of our continuing mission to provide you with exceptional heart care, we have created designated Provider Care Teams.  These Care Teams include your primary Cardiologist (physician) and Advanced Practice Providers (APPs -  Physician Assistants and Nurse Practitioners) who all work together to provide you with the care you need, when you need it.  You will need a follow up appointment in 12 months  Providers on your designated Care Team:   Murray Hodgkins, NP Christell Faith, PA-C Cadence Kathlen Mody, [place]  COVID-19 Vaccine Information can be found at: [email or web address] For questions related to vaccine distribution or appointments, please email [email or web address] or call [phone].

## 2022-07-25 ENCOUNTER — Other Ambulatory Visit: Payer: Self-pay | Admitting: Family Medicine

## 2022-07-25 DIAGNOSIS — I10 Essential (primary) hypertension: Secondary | ICD-10-CM

## 2022-09-07 DIAGNOSIS — H524 Presbyopia: Secondary | ICD-10-CM | POA: Diagnosis not present

## 2022-09-21 ENCOUNTER — Other Ambulatory Visit: Payer: Self-pay | Admitting: Family Medicine

## 2022-09-21 DIAGNOSIS — F5101 Primary insomnia: Secondary | ICD-10-CM

## 2022-09-22 NOTE — Telephone Encounter (Signed)
Requested medication (s) are due for refill today: Due 10/07/21  Requested medication (s) are on the active medication list: yes    Last refill: 04/08/22  #90  0 refills  Future visit scheduled no  Notes to clinic:Not delegated, please review. Thank you.  Requested Prescriptions  Pending Prescriptions Disp Refills   zolpidem (AMBIEN) 10 MG tablet [Pharmacy Med Name: ZOLPIDEM 10MG  TAB] 90 tablet 0    Sig: TAKE 1/2 TABLET BY MOUTH AT BEDTIME AS NEEDED FOR SLEEP     Not Delegated - Psychiatry:  Anxiolytics/Hypnotics Failed - 09/21/2022  4:46 PM      Failed - This refill cannot be delegated      Failed - Urine Drug Screen completed in last 360 days      Passed - Valid encounter within last 6 months    Recent Outpatient Visits           5 months ago Annual physical exam   [facility] Jacky Kindle, FNP   10 months ago Uncontrolled hypertension   [facility] Jacky Kindle, FNP   1 year ago Primary hypertension   [facility] Jacky Kindle, FNP   1 year ago Essential hypertension   [facility] Merita Norton T, FNP   1 year ago Blood glucose elevated   [facility] Jacky Kindle, [place]

## 2022-10-05 ENCOUNTER — Ambulatory Visit: Payer: No Typology Code available for payment source

## 2022-10-05 VITALS — Ht 62.0 in | Wt 186.0 lb

## 2022-10-05 DIAGNOSIS — Z0000 Encounter for general adult medical examination without abnormal findings: Secondary | ICD-10-CM

## 2022-10-05 NOTE — Patient Instructions (Signed)
Marissa Hahn , Thank you for taking time to come for your Medicare Wellness Visit. I appreciate your ongoing commitment to your health goals. Please review the following plan we discussed and let me know if I can assist you in the future.   These are the goals we discussed:  Goals      Appointments     Care Coordination Interventions: Patient requested assistance with confirming all upcoming appointments.  Denies urgent concerns. She would like to discuss her current medication regimen for hypertension. Reports history of BP being well maintained with ziac and amlodipine. Reports tolerating the regimen well. She would like to discuss this with her Cardiology provider.  All pending appointments reviewed. Informed of pending appointment with the Cardiology team on 12/18/2021. Declined need for urgent outreach or messaging. She will follow up as scheduled. Requested assistance with rescheduling her mammogram and Dexscan. Will contact Outpatient Imaging and follow up this week.      DIET - EAT MORE FRUITS AND VEGETABLES     Exercise 3x per week (30 min per time)     Recommend to start walking 3 days a week for 30 minutes.         This is a list of the screening recommended for you and due dates:  Health Maintenance  Topic Date Due   DTaP/Tdap/Td vaccine (1 - Tdap) Never done   Zoster (Shingles) Vaccine (1 of 2) Never done   COVID-19 Vaccine (3 - 2023-24 season) 12/18/2021   Colon Cancer Screening  04/19/2026*   Flu Shot  11/18/2022   Medicare Annual Wellness Visit  10/05/2023   Mammogram  05/18/2024   DEXA scan (bone density measurement)  05/18/2024   Pneumonia Vaccine  Completed   Hepatitis C Screening  Completed   HPV Vaccine  Aged Out  *Topic was postponed. The date shown is not the original due date.    Advanced directives: yes   Conditions/risks identified: none  Next appointment: Follow up in one year for your annual wellness visit 10/10/2023 @ 3:30pm  telephone   Preventive Care 65 Years and Older, Female Preventive care refers to lifestyle choices and visits with your health care provider that can promote health and wellness. What does preventive care include? A yearly physical exam. This is also called an annual well check. Dental exams once or twice a year. Routine eye exams. Ask your health care provider how often you should have your eyes checked. Personal lifestyle choices, including: Daily care of your teeth and gums. Regular physical activity. Eating a healthy diet. Avoiding tobacco and drug use. Limiting alcohol use. Practicing safe sex. Taking low-dose aspirin every day. Taking vitamin and mineral supplements as recommended by your health care provider. What happens during an annual well check? The services and screenings done by your health care provider during your annual well check will depend on your age, overall health, lifestyle risk factors, and family history of disease. Counseling  Your health care provider may ask you questions about your: Alcohol use. Tobacco use. Drug use. Emotional well-being. Home and relationship well-being. Sexual activity. Eating habits. History of falls. Memory and ability to understand (cognition). Work and work Astronomer. Reproductive health. Screening  You may have the following tests or measurements: Height, weight, and BMI. Blood pressure. Lipid and cholesterol levels. These may be checked every 5 years, or more frequently if you are over 67 years old. Skin check. Lung cancer screening. You may have this screening every year starting at age 54 if  you have a 30-pack-year history of smoking and currently smoke or have quit within the past 15 years. Fecal occult blood test (FOBT) of the stool. You may have this test every year starting at age 41. Flexible sigmoidoscopy or colonoscopy. You may have a sigmoidoscopy every 5 years or a colonoscopy every 10 years starting at age  23. Hepatitis C blood test. Hepatitis B blood test. Sexually transmitted disease (STD) testing. Diabetes screening. This is done by checking your blood sugar (glucose) after you have not eaten for a while (fasting). You may have this done every 1-3 years. Bone density scan. This is done to screen for osteoporosis. You may have this done starting at age 22. Mammogram. This may be done every 1-2 years. Talk to your health care provider about how often you should have regular mammograms. Talk with your health care provider about your test results, treatment options, and if necessary, the need for more tests. Vaccines  Your health care provider may recommend certain vaccines, such as: Influenza vaccine. This is recommended every year. Tetanus, diphtheria, and acellular pertussis (Tdap, Td) vaccine. You may need a Td booster every 10 years. Zoster vaccine. You may need this after age 56. Pneumococcal 13-valent conjugate (PCV13) vaccine. One dose is recommended after age 37. Pneumococcal polysaccharide (PPSV23) vaccine. One dose is recommended after age 78. Talk to your health care provider about which screenings and vaccines you need and how often you need them. This information is not intended to replace advice given to you by your health care provider. Make sure you discuss any questions you have with your health care provider. Document Released: 05/02/2015 Document Revised: 12/24/2015 Document Reviewed: 02/04/2015 Elsevier Interactive Patient Education  2017 [employer].  Fall Prevention in the Home Falls can cause injuries. They can happen to people of all ages. There are many things you can do to make your home safe and to help prevent falls. What can I do on the outside of my home? Regularly fix the edges of walkways and driveways and fix any cracks. Remove anything that might make you trip as you walk through a door, such as a raised step or threshold. Trim any bushes or trees on the  path to your home. Use bright outdoor lighting. Clear any walking paths of anything that might make someone trip, such as rocks or tools. Regularly check to see if handrails are loose or broken. Make sure that both sides of any steps have handrails. Any raised decks and porches should have guardrails on the edges. Have any leaves, snow, or ice cleared regularly. Use sand or salt on walking paths during winter. Clean up any spills in your garage right away. This includes oil or grease spills. What can I do in the bathroom? Use night lights. Install grab bars by the toilet and in the tub and shower. Do not use towel bars as grab bars. Use non-skid mats or decals in the tub or shower. If you need to sit down in the shower, use a plastic, non-slip stool. Keep the floor dry. Clean up any water that spills on the floor as soon as it happens. Remove soap buildup in the tub or shower regularly. Attach bath mats securely with double-sided non-slip rug tape. Do not have throw rugs and other things on the floor that can make you trip. What can I do in the bedroom? Use night lights. Make sure that you have a light by your bed that is easy to reach. Do not  use any sheets or blankets that are too big for your bed. They should not hang down onto the floor. Have a firm chair that has side arms. You can use this for support while you get dressed. Do not have throw rugs and other things on the floor that can make you trip. What can I do in the kitchen? Clean up any spills right away. Avoid walking on wet floors. Keep items that you use a lot in easy-to-reach places. If you need to reach something above you, use a strong step stool that has a grab bar. Keep electrical cords out of the way. Do not use floor polish or wax that makes floors slippery. If you must use wax, use non-skid floor wax. Do not have throw rugs and other things on the floor that can make you trip. What can I do with my stairs? Do not  leave any items on the stairs. Make sure that there are handrails on both sides of the stairs and use them. Fix handrails that are broken or loose. Make sure that handrails are as long as the stairways. Check any carpeting to make sure that it is firmly attached to the stairs. Fix any carpet that is loose or worn. Avoid having throw rugs at the top or bottom of the stairs. If you do have throw rugs, attach them to the floor with carpet tape. Make sure that you have a light switch at the top of the stairs and the bottom of the stairs. If you do not have them, ask someone to add them for you. What else can I do to help prevent falls? Wear shoes that: Do not have high heels. Have rubber bottoms. Are comfortable and fit you well. Are closed at the toe. Do not wear sandals. If you use a stepladder: Make sure that it is fully opened. Do not climb a closed stepladder. Make sure that both sides of the stepladder are locked into place. Ask someone to hold it for you, if possible. Clearly mark and make sure that you can see: Any grab bars or handrails. First and last steps. Where the edge of each step is. Use tools that help you move around (mobility aids) if they are needed. These include: Canes. Walkers. Scooters. Crutches. Turn on the lights when you go into a dark area. Replace any light bulbs as soon as they burn out. Set up your furniture so you have a clear path. Avoid moving your furniture around. If any of your floors are uneven, fix them. If there are any pets around you, be aware of where they are. Review your medicines with your doctor. Some medicines can make you feel dizzy. This can increase your chance of falling. Ask your doctor what other things that you can do to help prevent falls. This information is not intended to replace advice given to you by your health care provider. Make sure you discuss any questions you have with your health care provider. Document Released:  01/30/2009 Document Revised: 09/11/2015 Document Reviewed: 05/10/2014 Elsevier Interactive Patient Education  2017 [employer].

## 2022-10-05 NOTE — Progress Notes (Signed)
Subjective:   Marissa Hahn is a 77 y.o. female who presents for Medicare Annual (Subsequent) preventive examination.  Visit Complete: Virtual  I connected with  Marissa Hahn on 10/05/22 by a audio enabled telemedicine application and verified that I am speaking with the correct person using two identifiers.  Patient Location: Home  Provider Location: Office/Clinic  I discussed the limitations of evaluation and management by telemedicine. The patient expressed understanding and agreed to proceed.  Patient Medicare AWV questionnaire was completed by the patient on (not done); I have confirmed that all information answered by patient is correct and no changes since this date.  Review of Systems    Cardiac Risk Factors include: advanced age (>64men, >1 women);dyslipidemia;hypertension;obesity (BMI >30kg/m2)    Objective:    Today's Vitals   10/05/22 1537  Weight: 186 lb (84.4 kg)  Height: 5\' 2"  (1.575 m)   Body mass index is 34.02 kg/m.     10/05/2022    3:47 PM 10/01/2021    3:33 PM 10/18/2019   10:46 AM 10/17/2018    2:31 PM 10/04/2017   10:40 AM 10/07/2016    8:39 AM 10/05/2016    8:45 AM  Advanced Directives  Does Patient Have a Medical Advance Directive? Yes No Yes Yes No No No  Type of Estate agent of [place];Living will  Healthcare Power of [employer] of [place];Living will     Does patient want to make changes to medical advance directive?     No - Patient declined    Copy of Healthcare Power of Attorney in Chart?   No - copy requested No - copy requested     Would patient like information on creating a medical advance directive?  No - Patient declined   No - Patient declined      Current Medications (verified) Outpatient Encounter Medications as of 10/05/2022  Medication Sig   amLODipine (NORVASC) 10 MG tablet TAKE 1 TABLET DAILY   bisoprolol (ZEBETA) 10 MG tablet Take 2 tablets (20 mg total) by mouth daily.    doxazosin (CARDURA) 1 MG tablet Take 1 tablet (1 mg total) by mouth 2 (two) times daily.   fluticasone (FLONASE) 50 MCG/ACT nasal spray Place 2 sprays into both nostrils daily.   losartan (COZAAR) 100 MG tablet Take 1 tablet (100 mg total) by mouth daily.   rosuvastatin (CRESTOR) 20 MG tablet Take 1 tablet (20 mg total) by mouth daily.   Vitamin D, Ergocalciferol, (DRISDOL) 1.25 MG (50000 UNIT) CAPS capsule Take 1 capsule (50,000 Units total) by mouth every 7 (seven) days. Need Vit D lab draw for additional refills; this was filled as a courtesy.   zolpidem (AMBIEN) 5 MG tablet Take 1 tablet (5 mg total) by mouth at bedtime as needed for sleep.   No facility-administered encounter medications on file as of 10/05/2022.    Allergies (verified) Acetaminophen-codeine   History: Past Medical History:  Diagnosis Date   Hyperlipidemia    Hypertension    Past Surgical History:  Procedure Laterality Date   ABDOMINAL HYSTERECTOMY  1990   Abdominal   CARPAL TUNNEL RELEASE Left 01/2010   Family History  Problem Relation Age of Onset   Hypertension Mother    Lung cancer Father    Healthy Other    Hypertension Sister    Hypertension Sister    Hypertension Sister    Breast cancer Neg Hx    Social History   Socioeconomic History   Marital status:  Divorced    Spouse name: Not on file   Number of children: 2   Years of education: H/S   Highest education level: 12th grade  Occupational History   Occupation: Retired  Tobacco Use   Smoking status: Never   Smokeless tobacco: Never  Vaping Use   Vaping Use: Never used  Substance and Sexual Activity   Alcohol use: Not Currently   Drug use: No   Sexual activity: Not on file  Other Topics Concern   Not on file  Social History Narrative   Not on file   Social Determinants of Health   Financial Resource Strain: Low Risk  (10/05/2022)   Overall Financial Resource Strain (CARDIA)    Difficulty of Paying Living Expenses: Not hard at all   Food Insecurity: No Food Insecurity (10/05/2022)   Hunger Vital Sign    Worried About Running Out of Food in the Last Year: Never true    Ran Out of Food in the Last Year: Never true  Transportation Needs: No Transportation Needs (10/05/2022)   PRAPARE - Administrator, Civil Service (Medical): No    Lack of Transportation (Non-Medical): No  Physical Activity: Insufficiently Active (10/05/2022)   Exercise Vital Sign    Days of Exercise per Week: 3 days    Minutes of Exercise per Session: 30 min  Stress: No Stress Concern Present (10/05/2022)   [employer] of Occupational Health - Occupational Stress Questionnaire    Feeling of Stress : Not at all  Social Connections: Moderately Isolated (10/05/2022)   Social Connection and Isolation Panel [NHANES]    Frequency of Communication with Friends and Family: More than three times a week    Frequency of Social Gatherings with Friends and Family: Once a week    Attends Religious Services: More than 4 times per year    Active Member of [employer] or Organizations: No    Attends Engineer, structural: Never    Marital Status: Divorced    Tobacco Counseling Counseling given: Not Answered   Clinical Intake:  Pre-visit preparation completed: Yes  Pain : No/denies pain     BMI - recorded: 34.02 Nutritional Status: BMI > 30  Obese Nutritional Risks: None Diabetes: No  How often do you need to have someone help you when you read instructions, pamphlets, or other written materials from your doctor or pharmacy?: 1 - Never  Interpreter Needed?: No  Comments: lives alone Information entered by :: B.Takeia Ciaravino,LPN   Activities of Daily Living    10/05/2022    3:47 PM 04/08/2022    4:12 PM  In your present state of health, do you have any difficulty performing the following activities:  Hearing? 0 1  Vision? 0 0  Difficulty concentrating or making decisions? 0 0  Walking or climbing stairs? 0 0  Dressing or bathing?  0 0  Doing errands, shopping? 0 0  Preparing Food and eating ? N   Using the Toilet? N   In the past six months, have you accidently leaked urine? N   Do you have problems with loss of bowel control? N   Managing your Medications? N   Managing your Finances? N   Housekeeping or managing your Housekeeping? N     Patient Care Team: Jacky Kindle, FNP as PCP - General (Family Medicine) Antonieta Iba, MD as PCP - Cardiology (Cardiology) [facility], [facility] Constance Haw, Lydia, RN as Case Manager  Indicate any recent Medical Services you  may have received from other than [facility] providers in the past year (date may be approximate).     Assessment:   This is a routine wellness examination for Odaliz.  Hearing/Vision screen Hearing Screening - Comments:: Adequate hearing Vision Screening - Comments:: Adequate vision w/glasses [facility] consult in July  Dietary issues and exercise activities discussed:     Goals Addressed             This Visit's Progress    DIET - EAT MORE FRUITS AND VEGETABLES   On track    Exercise 3x per week (30 min per time)   Not on track    Recommend to start walking 3 days a week for 30 minutes.        Depression Screen    10/05/2022    3:45 PM 04/08/2022    4:12 PM 10/01/2021    3:30 PM 09/11/2021    1:52 PM 03/06/2021    9:39 PM 01/16/2021    2:16 PM 10/18/2019   10:34 AM  PHQ 2/9 Scores  PHQ - 2 Score 0 1 0 0 0 0 0  PHQ- 9 Score  8 1 1        Fall Risk    10/05/2022    3:40 PM 10/01/2021    3:34 PM 09/11/2021    1:51 PM 01/23/2021    1:17 PM 10/18/2019   10:46 AM  Fall Risk   Falls in the past year? 0 0 0 0 0  Number falls in past yr: 0 0 0 0 0  Injury with Fall? 0 0 0 0 0  Risk for fall due to : No Fall Risks No Fall Risks No Fall Risks    Follow up Falls prevention discussed;Education provided Falls evaluation completed Falls evaluation completed      MEDICARE RISK AT HOME:  Medicare Risk at Home - 10/05/22 1541      Any stairs in or around the home? Yes    If so, are there any without handrails? Yes    Home free of loose throw rugs in walkways, pet beds, electrical cords, etc? Yes    Adequate lighting in your home to reduce risk of falls? Yes    Life alert? No    Use of a cane, walker or w/c? No    Grab bars in the bathroom? No    Shower chair or bench in shower? No    Elevated toilet seat or a handicapped toilet? Yes             TIMED UP AND GO:  Was the test performed?  No    Cognitive Function:        10/05/2022    3:48 PM 10/01/2021    3:36 PM 10/05/2016    8:48 AM  6CIT Screen  What Year? 0 points 0 points 0 points  What month? 0 points 0 points 0 points  What time? 0 points 0 points 0 points  Count back from 20 0 points 0 points 0 points  Months in reverse 0 points 0 points 0 points  Repeat phrase 2 points 0 points 0 points  Total Score 2 points 0 points 0 points    Immunizations Immunization History  Administered Date(s) Administered   Fluad Quad(high Dose 65+) 02/20/2021, 04/08/2022   Influenza-Unspecified 03/05/2019   Moderna Sars-Covid-2 Vaccination 05/23/2019, 06/20/2019   Pneumococcal Conjugate-13 08/24/2013   Pneumococcal Polysaccharide-23 03/25/2015    TDAP status: Up to date  Flu Vaccine status:  Up to date  Pneumococcal vaccine status: Up to date  Covid-19 vaccine status: Completed vaccines  Qualifies for Shingles Vaccine? Yes   Zostavax completed No   Shingrix Completed?: No.    Education has been provided regarding the importance of this vaccine. Patient has been advised to call insurance company to determine out of pocket expense if they have not yet received this vaccine. Advised may also receive vaccine at local pharmacy or Health Dept. Verbalized acceptance and understanding.  Screening Tests Health Maintenance  Topic Date Due   DTaP/Tdap/Td (1 - Tdap) Never done   Zoster Vaccines- Shingrix (1 of 2) Never done   COVID-19 Vaccine (3 - 2023-24  season) 12/18/2021   Colonoscopy  04/19/2026 (Originally 08/01/2016)   INFLUENZA VACCINE  11/18/2022   Medicare Annual Wellness (AWV)  10/05/2023   MAMMOGRAM  05/18/2024   DEXA SCAN  05/18/2024   Pneumonia Vaccine 60+ Years old  Completed   Hepatitis C Screening  Completed   HPV VACCINES  Aged Out    Health Maintenance  Health Maintenance Due  Topic Date Due   DTaP/Tdap/Td (1 - Tdap) Never done   Zoster Vaccines- Shingrix (1 of 2) Never done   COVID-19 Vaccine (3 - 2023-24 season) 12/18/2021    Colorectal cancer screening: Type of screening: Colonoscopy. Completed yes. Repeat every 5-10 years  Mammogram status: Completed yes. Repeat every year  Bone Density status: Completed yes. Results reflect: Bone density results: NORMAL. Repeat every 5 years.  Lung Cancer Screening: (Low Dose CT Chest recommended if Age 49-80 years, 20 pack-year currently smoking OR have quit w/in 15years.) does not qualify.   Lung Cancer Screening Referral: no  Additional Screening:  Hepatitis C Screening: does not qualify; Completed yes  Vision Screening: Recommended annual ophthalmology exams for early detection of glaucoma and other disorders of the eye. Is the patient up to date with their annual eye exam?  Yes  Who is the provider or what is the name of the office in which the patient attends annual eye exams? [facility] If pt is not established with a provider, would they like to be referred to a provider to establish care? No .   Dental Screening: Recommended annual dental exams for proper oral hygiene  Diabetic Foot Exam: n/a  Community Resource Referral / Chronic Care Management: CRR required this visit?  No   CCM required this visit?  No     Plan:     I have personally reviewed and noted the following in the patient's chart:   Medical and social history Use of alcohol, tobacco or illicit drugs  Current medications and supplements including opioid prescriptions. Patient is not  currently taking opioid prescriptions. Functional ability and status Nutritional status Physical activity Advanced directives List of other physicians Hospitalizations, surgeries, and ER visits in previous 12 months Vitals Screenings to include cognitive, depression, and falls Referrals and appointments  In addition, I have reviewed and discussed with patient certain preventive protocols, quality metrics, and best practice recommendations. A written personalized care plan for preventive services as well as general preventive health recommendations were provided to patient.     Sue Lush, LPN   1/61/0960   After Visit Summary: (MyChart) Due to this being a telephonic visit, the after visit summary with patients personalized plan was offered to patient via MyChart   Nurse Notes: The patient states she is doing well and has no concerns or questions at this time.

## 2022-11-11 DIAGNOSIS — H2513 Age-related nuclear cataract, bilateral: Secondary | ICD-10-CM | POA: Diagnosis not present

## 2022-11-11 DIAGNOSIS — H538 Other visual disturbances: Secondary | ICD-10-CM | POA: Diagnosis not present

## 2022-11-19 ENCOUNTER — Encounter: Payer: Self-pay | Admitting: Ophthalmology

## 2022-11-22 DIAGNOSIS — H2513 Age-related nuclear cataract, bilateral: Secondary | ICD-10-CM | POA: Diagnosis not present

## 2022-11-25 ENCOUNTER — Encounter: Payer: Self-pay | Admitting: Ophthalmology

## 2022-11-25 NOTE — Discharge Instructions (Signed)

## 2022-11-29 ENCOUNTER — Encounter
Admission: RE | Disposition: A | Discharge status: Home / Self Care | Payer: Self-pay | Source: Home / Self Care | Attending: Ophthalmology

## 2022-11-29 ENCOUNTER — Encounter: Payer: Self-pay | Admitting: Ophthalmology

## 2022-11-29 ENCOUNTER — Other Ambulatory Visit: Payer: Self-pay

## 2022-11-29 ENCOUNTER — Ambulatory Visit: Payer: No Typology Code available for payment source | Admitting: Anesthesiology

## 2022-11-29 ENCOUNTER — Ambulatory Visit
Admission: RE | Admit: 2022-11-29 | Discharge: 2022-11-29 | Disposition: A | Discharge status: Home / Self Care | Payer: No Typology Code available for payment source | Attending: Ophthalmology | Admitting: Ophthalmology

## 2022-11-29 DIAGNOSIS — H2511 Age-related nuclear cataract, right eye: Secondary | ICD-10-CM | POA: Diagnosis not present

## 2022-11-29 DIAGNOSIS — I739 Peripheral vascular disease, unspecified: Secondary | ICD-10-CM | POA: Diagnosis not present

## 2022-11-29 DIAGNOSIS — I10 Essential (primary) hypertension: Secondary | ICD-10-CM | POA: Insufficient documentation

## 2022-11-29 HISTORY — PX: CATARACT EXTRACTION W/PHACO: SHX586

## 2022-11-29 HISTORY — DX: Unspecified osteoarthritis, unspecified site: M19.90

## 2022-11-29 HISTORY — DX: Anxiety disorder, unspecified: F41.9

## 2022-11-29 HISTORY — DX: Atherosclerosis of renal artery: I70.1

## 2022-11-29 SURGERY — PHACOEMULSIFICATION, CATARACT, WITH IOL INSERTION
Anesthesia: Monitor Anesthesia Care | Site: Eye | Laterality: Right

## 2022-11-29 MED ORDER — FENTANYL CITRATE (PF) 100 MCG/2ML IJ SOLN
INTRAMUSCULAR | Status: DC | PRN
  Administered 2022-11-29: 50 ug via INTRAVENOUS

## 2022-11-29 MED ORDER — SIGHTPATH DOSE#1 NA HYALUR & NA CHOND-NA HYALUR IO KIT
PACK | INTRAOCULAR | Status: DC | PRN
  Administered 2022-11-29: 1 via OPHTHALMIC

## 2022-11-29 MED ORDER — LIDOCAINE HCL (PF) 2 % IJ SOLN
INTRAOCULAR | Status: DC | PRN
  Administered 2022-11-29: 1 mL via INTRAOCULAR

## 2022-11-29 MED ORDER — MOXIFLOXACIN HCL 0.5 % OP SOLN
OPHTHALMIC | Status: DC | PRN
  Administered 2022-11-29: .2 mL via OPHTHALMIC

## 2022-11-29 MED ORDER — ARMC OPHTHALMIC DILATING DROPS
1.0000 | OPHTHALMIC | Status: DC | PRN
  Administered 2022-11-29 (×3): 1 via OPHTHALMIC

## 2022-11-29 MED ORDER — SIGHTPATH DOSE#1 BSS IO SOLN
INTRAOCULAR | Status: DC | PRN
  Administered 2022-11-29: 121 mL via OPHTHALMIC

## 2022-11-29 MED ORDER — TETRACAINE HCL 0.5 % OP SOLN
1.0000 [drp] | OPHTHALMIC | Status: DC | PRN
  Administered 2022-11-29 (×3): 1 [drp] via OPHTHALMIC

## 2022-11-29 MED ORDER — SIGHTPATH DOSE#1 BSS IO SOLN
INTRAOCULAR | Status: DC | PRN
  Administered 2022-11-29: 15 mL

## 2022-11-29 MED ORDER — MIDAZOLAM HCL 2 MG/2ML IJ SOLN
INTRAMUSCULAR | Status: DC | PRN
  Administered 2022-11-29: 1 mg via INTRAVENOUS

## 2022-11-29 MED ORDER — LACTATED RINGERS IV SOLN: INTRAVENOUS | Status: DC

## 2022-11-29 SURGICAL SUPPLY — 11 items
CATARACT SUITE SIGHTPATH (MISCELLANEOUS) ×1
DISSECTOR HYDRO NUCLEUS 50X22 (MISCELLANEOUS) ×1 IMPLANT
FEE CATARACT SUITE SIGHTPATH (MISCELLANEOUS) ×1 IMPLANT
GLOVE SURG GAMMEX PI TX LF 7.5 (GLOVE) ×1 IMPLANT
GLOVE SURG SYN 8.5 E (GLOVE) ×1
GLOVE SURG SYN 8.5 PF PI (GLOVE) ×1 IMPLANT
LENS IOL TECNIS EYHANCE 21.5 (Intraocular Lens) IMPLANT
NDL FILTER BLUNT 18X1 1/2 (NEEDLE) ×1 IMPLANT
NEEDLE FILTER BLUNT 18X1 1/2 (NEEDLE) ×1
SYR 3ML LL SCALE MARK (SYRINGE) ×1 IMPLANT
SYR 5ML LL (SYRINGE) ×1 IMPLANT

## 2022-11-29 NOTE — Anesthesia Preprocedure Evaluation (Addendum)
Anesthesia Evaluation  Patient identified by MRN, date of birth, ID band Patient awake    Reviewed: Allergy & Precautions, H&P , NPO status , Patient's Chart, lab work & pertinent test results  Airway Mallampati: III  TM Distance: >3 FB Neck ROM: Full    Dental no notable dental hx.    Pulmonary neg pulmonary ROS   Pulmonary exam normal breath sounds clear to auscultation       Cardiovascular hypertension, + Peripheral Vascular Disease  Normal cardiovascular exam Rhythm:Regular Rate:Normal     Neuro/Psych negative neurological ROS  negative psych ROS   GI/Hepatic negative GI ROS, Neg liver ROS,,,  Endo/Other  negative endocrine ROS    Renal/GU negative Renal ROS  negative genitourinary   Musculoskeletal negative musculoskeletal ROS (+) Arthritis ,    Abdominal   Peds negative pediatric ROS (+)  Hematology negative hematology ROS (+)   Anesthesia Other Findings Hyperlipidemia  Hypertension Renal artery stenosis   Arthritis Anxiety   Reproductive/Obstetrics negative OB ROS                              Anesthesia Physical Anesthesia Plan  ASA: 2  Anesthesia Plan: MAC   Post-op Pain Management:    Induction: Intravenous  PONV Risk Score and Plan:   Airway Management Planned: Natural Airway and Nasal Cannula  Additional Equipment:   Intra-op Plan:   Post-operative Plan:   Informed Consent: I have reviewed the patients History and Physical, chart, labs and discussed the procedure including the risks, benefits and alternatives for the proposed anesthesia with the patient or authorized representative who has indicated his/her understanding and acceptance.     Dental Advisory Given  Plan Discussed with: Anesthesiologist, CRNA and Surgeon  Anesthesia Plan Comments: (Patient consented for risks of anesthesia including but not limited to:  - adverse reactions to  medications - damage to eyes, teeth, lips or other oral mucosa - nerve damage due to positioning  - sore throat or hoarseness - Damage to heart, brain, nerves, lungs, other parts of body or loss of life  Patient voiced understanding.)         Anesthesia Quick Evaluation

## 2022-11-29 NOTE — Transfer of Care (Signed)
Immediate Anesthesia Transfer of Care Note  [name]  Procedure(s) Performed: CATARACT EXTRACTION PHACO AND INTRAOCULAR LENS PLACEMENT (IOC) RIGHT  5.85  00:47.2 (Right: Eye)  Patient Location: PACU  Anesthesia Type: MAC  Level of Consciousness: awake, alert  and patient cooperative  Airway and Oxygen Therapy: Patient Spontanous Breathing and Patient connected to supplemental oxygen  Post-op Assessment: Post-op Vital signs reviewed, Patient's Cardiovascular Status Stable, Respiratory Function Stable, Patent Airway and No signs of Nausea or vomiting  Post-op Vital Signs: Reviewed and stable  Complications: No notable events documented.

## 2022-11-29 NOTE — Op Note (Signed)
OPERATIVE NOTE  DELPHINE CRYDER [id number] 11/29/2022   PREOPERATIVE DIAGNOSIS:  Nuclear sclerotic cataract right eye.  H25.11   POSTOPERATIVE DIAGNOSIS:    Nuclear sclerotic cataract right eye.     PROCEDURE:  Phacoemusification with posterior chamber intraocular lens placement of the right eye   LENS:   Implant Name Type Inv. Item Serial No. Manufacturer Lot No. LRB No. Used Action  LENS IOL TECNIS EYHANCE 21.5 - [id number] Intraocular Lens LENS IOL TECNIS EYHANCE 21.5 [id number] SIGHT[employer]  Right 1 Implanted       Procedure(s): CATARACT EXTRACTION PHACO AND INTRAOCULAR LENS PLACEMENT (IOC) RIGHT  5.85  00:47.2 (Right)  DIB00 +21.5   ULTRASOUND TIME: 0 minutes 47 seconds.  CDE 5.85   SURGEON:  Willey Blade, MD, MPH  ANESTHESIOLOGIST: Anesthesiologist: Marisue Humble, MD CRNA: Barbette Hair, CRNA   ANESTHESIA:  Topical with tetracaine drops augmented with 1% preservative-free intracameral lidocaine.  ESTIMATED BLOOD LOSS: less than 1 mL.   COMPLICATIONS:  None.   DESCRIPTION OF PROCEDURE:  The patient was identified in the holding room and transported to the operating room and placed in the supine position under the operating microscope.  The right eye was identified as the operative eye and it was prepped and draped in the usual sterile ophthalmic fashion.   A 1.0 millimeter clear-corneal paracentesis was made at the 10:30 position. 0.5 ml of preservative-free 1% lidocaine with epinephrine was injected into the anterior chamber.  The anterior chamber was filled with viscoelastic.  A 2.4 millimeter keratome was used to make a near-clear corneal incision at the 8:00 position.  A curvilinear capsulorrhexis was made with a cystotome and capsulorrhexis forceps.  Balanced salt solution was used to hydrodissect and hydrodelineate the nucleus.   Phacoemulsification was then used in stop and chop fashion to remove the lens nucleus and epinucleus.  The remaining cortex was then  removed using the irrigation and aspiration handpiece. Viscoelastic was then placed into the capsular bag to distend it for lens placement.  A lens was then injected into the capsular bag.  The remaining viscoelastic was aspirated.   Wounds were hydrated with balanced salt solution.  The anterior chamber was inflated to a physiologic pressure with balanced salt solution.   Intracameral vigamox 0.1 mL undiluted was injected into the eye and a drop placed onto the ocular surface.  No wound leaks were noted.  The patient was taken to the recovery room in stable condition without complications of anesthesia or surgery  Willey Blade 11/29/2022, 11:01 AM

## 2022-11-29 NOTE — Anesthesia Postprocedure Evaluation (Signed)
Anesthesia Post Note  Patient: Chartered certified accountant  Procedure(s) Performed: CATARACT EXTRACTION PHACO AND INTRAOCULAR LENS PLACEMENT (IOC) RIGHT  5.85  00:47.2 (Right: Eye)  Patient location during evaluation: PACU Anesthesia Type: MAC Level of consciousness: awake and alert Pain management: pain level controlled Vital Signs Assessment: post-procedure vital signs reviewed and stable Respiratory status: spontaneous breathing, nonlabored ventilation, respiratory function stable and patient connected to nasal cannula oxygen Cardiovascular status: stable and blood pressure returned to baseline Postop Assessment: no apparent nausea or vomiting Anesthetic complications: no   No notable events documented.   Last Vitals:  Vitals:   11/29/22 1100 11/29/22 1110  BP: 124/65 (!) 129/58  Pulse: (!) 57 (!) 58  Resp: 20 14  Temp: 36.4 C (!) 36.4 C  SpO2: 96% 95%    Last Pain:  Vitals:   11/29/22 1110  TempSrc:   PainSc: 0-No pain                 Darletta Noblett C Tykee Heideman

## 2022-11-29 NOTE — H&P (Signed)
[facility]   Primary Care Physician:  Jacky Kindle, FNP Ophthalmologist: Dr. Willey Blade  Pre-Procedure History & Physical: HPI:  Marissa Hahn is a 77 y.o. female here for cataract surgery.   Past Medical History:  Diagnosis Date   Anxiety    Arthritis    Hyperlipidemia    Hypertension    Renal artery stenosis [facility])     Past Surgical History:  Procedure Laterality Date   ABDOMINAL HYSTERECTOMY  1990   Abdominal   CARPAL TUNNEL RELEASE Left 01/2010    Prior to Admission medications   Medication Sig Start Date End Date Taking? Authorizing Provider  amLODipine (NORVASC) 10 MG tablet TAKE 1 TABLET DAILY 07/26/22  Yes Jacky Kindle, FNP  bisoprolol (ZEBETA) 10 MG tablet Take 2 tablets (20 mg total) by mouth daily. 12/18/21  Yes Hammock, Lavonna Rua, NP  doxazosin (CARDURA) 1 MG tablet Take 1 tablet (1 mg total) by mouth 2 (two) times daily. 06/02/22  Yes Gollan, Tollie Pizza, MD  fluticasone (FLONASE) 50 MCG/ACT nasal spray Place 2 sprays into both nostrils daily. 06/09/21  Yes Jacky Kindle, FNP  losartan (COZAAR) 100 MG tablet Take 1 tablet (100 mg total) by mouth daily. 12/18/21 12/13/22 Yes Hammock, Sheri, NP  Vitamin D, Ergocalciferol, (DRISDOL) 1.25 MG (50000 UNIT) CAPS capsule Take 1 capsule (50,000 Units total) by mouth every 7 (seven) days. Need Vit D lab draw for additional refills; this was filled as a courtesy. 06/09/21  Yes Jacky Kindle, FNP  zolpidem (AMBIEN) 5 MG tablet Take 1 tablet (5 mg total) by mouth at bedtime as needed for sleep. 09/22/22  Yes Jacky Kindle, FNP  rosuvastatin (CRESTOR) 20 MG tablet Take 1 tablet (20 mg total) by mouth daily. Patient not taking: Reported on 11/25/2022 04/09/22   Merita Norton T, FNP    Allergies as of 11/17/2022 - Review Complete 10/05/2022  Allergen Reaction Noted   Acetaminophen-codeine  12/31/2014    Family History  Problem Relation Age of Onset   Hypertension Mother    Lung cancer Father    Healthy Other     Hypertension Sister    Hypertension Sister    Hypertension Sister    Breast cancer Neg Hx     Social History   Socioeconomic History   Marital status: Divorced    Spouse name: Not on file   Number of children: 2   Years of education: H/S   Highest education level: 12th grade  Occupational History   Occupation: Retired  Tobacco Use   Smoking status: Never   Smokeless tobacco: Never  Vaping Use   Vaping status: Never Used  Substance and Sexual Activity   Alcohol use: Not Currently   Drug use: No   Sexual activity: Not on file  Other Topics Concern   Not on file  Social History Narrative   Not on file   Social Determinants of Health   Financial Resource Strain: Low Risk  (10/05/2022)   Overall Financial Resource Strain (CARDIA)    Difficulty of Paying Living Expenses: Not hard at all  Food Insecurity: No Food Insecurity (10/05/2022)   Hunger Vital Sign    Worried About Running Out of Food in the Last Year: Never true    Ran Out of Food in the Last Year: Never true  Transportation Needs: No Transportation Needs (10/05/2022)   PRAPARE - Administrator, Civil Service (Medical): No    Lack of Transportation (Non-Medical): No  Physical  Activity: Insufficiently Active (10/05/2022)   Exercise Vital Sign    Days of Exercise per Week: 3 days    Minutes of Exercise per Session: 30 min  Stress: No Stress Concern Present (10/05/2022)   [employer] of Occupational Health - Occupational Stress Questionnaire    Feeling of Stress : Not at all  Social Connections: Moderately Isolated (10/05/2022)   Social Connection and Isolation Panel [NHANES]    Frequency of Communication with Friends and Family: More than three times a week    Frequency of Social Gatherings with Friends and Family: Once a week    Attends Religious Services: More than 4 times per year    Active Member of [employer] or Organizations: No    Attends Banker Meetings: Never    Marital Status:  Divorced  Catering manager Violence: Not At Risk (10/05/2022)   Humiliation, Afraid, Rape, and Kick questionnaire    Fear of Current or Ex-Partner: No    Emotionally Abused: No    Physically Abused: No    Sexually Abused: No    Review of Systems: See HPI, otherwise negative ROS  Physical Exam: BP (!) 183/72   Pulse 70   Temp 98.8 F (37.1 C) (Temporal)   Resp 20   Ht 5\' 2"  (1.575 m)   Wt 88.9 kg   SpO2 96%   BMI 35.85 kg/m  General:   Alert, cooperative in NAD Head:  Normocephalic and atraumatic. Respiratory:  Normal work of breathing. Cardiovascular:  RRR  Impression/Plan: Marissa Hahn is here for cataract surgery.  Risks, benefits, limitations, and alternatives regarding cataract surgery have been reviewed with the patient.  Questions have been answered.  All parties agreeable.   Willey Blade, MD  11/29/2022, 10:34 AM

## 2022-11-30 ENCOUNTER — Encounter: Payer: Self-pay | Admitting: Ophthalmology

## 2022-11-30 DIAGNOSIS — H2512 Age-related nuclear cataract, left eye: Secondary | ICD-10-CM | POA: Diagnosis not present

## 2022-11-30 NOTE — Anesthesia Preprocedure Evaluation (Addendum)
Anesthesia Evaluation  Patient identified by MRN, date of birth, ID band Patient awake    Reviewed: Allergy & Precautions, H&P , NPO status , Patient's Chart, lab work & pertinent test results  Airway Mallampati: III  TM Distance: >3 FB Neck ROM: Full    Dental no notable dental hx.    Pulmonary neg pulmonary ROS   Pulmonary exam normal breath sounds clear to auscultation       Cardiovascular hypertension, + Peripheral Vascular Disease  negative cardio ROS Normal cardiovascular exam Rhythm:Regular Rate:Normal     Neuro/Psych   Anxiety     negative neurological ROS  negative psych ROS   GI/Hepatic negative GI ROS, Neg liver ROS,,,  Endo/Other  negative endocrine ROS    Renal/GU negative Renal ROS  negative genitourinary   Musculoskeletal negative musculoskeletal ROS (+) Arthritis ,    Abdominal   Peds negative pediatric ROS (+)  Hematology negative hematology ROS (+)   Anesthesia Other Findings Hyperlipidemia  Hypertension Renal artery stenosis (HCC)  Arthritis Anxiety   Previous cataract surgery 11-29-22 Dr. Juel Burrow   Reproductive/Obstetrics negative OB ROS                             Anesthesia Physical Anesthesia Plan  ASA: 2  Anesthesia Plan: MAC   Post-op Pain Management:    Induction: Intravenous  PONV Risk Score and Plan:   Airway Management Planned: Natural Airway and Nasal Cannula  Additional Equipment:   Intra-op Plan:   Post-operative Plan:   Informed Consent: I have reviewed the patients History and Physical, chart, labs and discussed the procedure including the risks, benefits and alternatives for the proposed anesthesia with the patient or authorized representative who has indicated his/her understanding and acceptance.     Dental Advisory Given  Plan Discussed with: Anesthesiologist, CRNA and Surgeon  Anesthesia Plan Comments: (Patient consented  for risks of anesthesia including but not limited to:  - adverse reactions to medications - damage to eyes, teeth, lips or other oral mucosa - nerve damage due to positioning  - sore throat or hoarseness - Damage to heart, brain, nerves, lungs, other parts of body or loss of life  Patient voiced understanding.)        Anesthesia Quick Evaluation

## 2022-12-03 ENCOUNTER — Encounter: Payer: Self-pay | Admitting: Ophthalmology

## 2022-12-03 ENCOUNTER — Other Ambulatory Visit: Payer: Self-pay

## 2022-12-10 NOTE — Discharge Instructions (Signed)

## 2022-12-13 ENCOUNTER — Other Ambulatory Visit: Payer: Self-pay

## 2022-12-13 ENCOUNTER — Encounter
Admission: RE | Disposition: A | Discharge status: Home / Self Care | Payer: Self-pay | Source: Home / Self Care | Attending: Ophthalmology

## 2022-12-13 ENCOUNTER — Ambulatory Visit: Payer: No Typology Code available for payment source | Admitting: Anesthesiology

## 2022-12-13 ENCOUNTER — Encounter: Payer: Self-pay | Admitting: Ophthalmology

## 2022-12-13 ENCOUNTER — Ambulatory Visit
Admission: RE | Admit: 2022-12-13 | Discharge: 2022-12-13 | Disposition: A | Discharge status: Home / Self Care | Payer: No Typology Code available for payment source | Attending: Ophthalmology | Admitting: Ophthalmology

## 2022-12-13 DIAGNOSIS — H2512 Age-related nuclear cataract, left eye: Secondary | ICD-10-CM | POA: Diagnosis not present

## 2022-12-13 DIAGNOSIS — I10 Essential (primary) hypertension: Secondary | ICD-10-CM | POA: Insufficient documentation

## 2022-12-13 DIAGNOSIS — M1990 Unspecified osteoarthritis, unspecified site: Secondary | ICD-10-CM | POA: Insufficient documentation

## 2022-12-13 DIAGNOSIS — I739 Peripheral vascular disease, unspecified: Secondary | ICD-10-CM | POA: Insufficient documentation

## 2022-12-13 HISTORY — PX: CATARACT EXTRACTION W/PHACO: SHX586

## 2022-12-13 SURGERY — PHACOEMULSIFICATION, CATARACT, WITH IOL INSERTION
Anesthesia: Monitor Anesthesia Care | Laterality: Left

## 2022-12-13 MED ORDER — LIDOCAINE HCL (PF) 2 % IJ SOLN
INTRAOCULAR | Status: DC | PRN
  Administered 2022-12-13: 4 mL via INTRAOCULAR

## 2022-12-13 MED ORDER — MOXIFLOXACIN HCL 0.5 % OP SOLN
OPHTHALMIC | Status: DC | PRN
  Administered 2022-12-13: .2 mL via OPHTHALMIC

## 2022-12-13 MED ORDER — LACTATED RINGERS IV SOLN: INTRAVENOUS | Status: DC

## 2022-12-13 MED ORDER — SIGHTPATH DOSE#1 BSS IO SOLN
INTRAOCULAR | Status: DC | PRN
  Administered 2022-12-13: 110 mL via OPHTHALMIC

## 2022-12-13 MED ORDER — SIGHTPATH DOSE#1 BSS IO SOLN
INTRAOCULAR | Status: DC | PRN
  Administered 2022-12-13: 15 mL via INTRAOCULAR

## 2022-12-13 MED ORDER — TETRACAINE HCL 0.5 % OP SOLN
1.0000 [drp] | OPHTHALMIC | Status: DC | PRN
  Administered 2022-12-13 (×3): 1 [drp] via OPHTHALMIC

## 2022-12-13 MED ORDER — MIDAZOLAM HCL 2 MG/2ML IJ SOLN
INTRAMUSCULAR | Status: DC | PRN
  Administered 2022-12-13: 2 mg via INTRAVENOUS

## 2022-12-13 MED ORDER — SIGHTPATH DOSE#1 NA HYALUR & NA CHOND-NA HYALUR IO KIT
PACK | INTRAOCULAR | Status: DC | PRN
  Administered 2022-12-13: 1 via OPHTHALMIC

## 2022-12-13 MED ORDER — ARMC OPHTHALMIC DILATING DROPS
1.0000 | OPHTHALMIC | Status: DC | PRN
  Administered 2022-12-13 (×3): 1 via OPHTHALMIC

## 2022-12-13 SURGICAL SUPPLY — 11 items
CATARACT SUITE SIGHTPATH (MISCELLANEOUS) ×1
DISSECTOR HYDRO NUCLEUS 50X22 (MISCELLANEOUS) ×1 IMPLANT
FEE CATARACT SUITE SIGHTPATH (MISCELLANEOUS) ×1 IMPLANT
GLOVE SURG GAMMEX PI TX LF 7.5 (GLOVE) ×1 IMPLANT
GLOVE SURG SYN 8.5 E (GLOVE) ×1
GLOVE SURG SYN 8.5 PF PI (GLOVE) ×1 IMPLANT
LENS IOL TECNIS EYHANCE 25.5 (Intraocular Lens) IMPLANT
NDL FILTER BLUNT 18X1 1/2 (NEEDLE) ×1 IMPLANT
NEEDLE FILTER BLUNT 18X1 1/2 (NEEDLE) ×1
SYR 3ML LL SCALE MARK (SYRINGE) ×1 IMPLANT
SYR 5ML LL (SYRINGE) ×1 IMPLANT

## 2022-12-13 NOTE — Op Note (Signed)
OPERATIVE NOTE  Marissa Hahn [id number] 12/13/2022   PREOPERATIVE DIAGNOSIS:  Nuclear sclerotic cataract left eye.  H25.12   POSTOPERATIVE DIAGNOSIS:    Nuclear sclerotic cataract left eye.     PROCEDURE:  Phacoemusification with posterior chamber intraocular lens placement of the left eye   LENS:   Implant Name Type Inv. Item Serial No. Manufacturer Lot No. LRB No. Used Action  LENS IOL TECNIS EYHANCE 25.5 - [id number] Intraocular Lens LENS IOL TECNIS EYHANCE 25.5 [id number] [employer]  Left 1 Implanted      Procedure(s): CATARACT EXTRACTION PHACO AND INTRAOCULAR LENS PLACEMENT (IOC) LEFT 4.64 00:38.7 (Left)  DIB00 +25.5   ULTRASOUND TIME: 0 minutes 38 seconds.  CDE 4.64   SURGEON:  Willey Blade, MD, MPH   ANESTHESIA:  Topical with tetracaine drops augmented with 1% preservative-free intracameral lidocaine.  ESTIMATED BLOOD LOSS: <1 mL   COMPLICATIONS:  None.   DESCRIPTION OF PROCEDURE:  The patient was identified in the holding room and transported to the operating room and placed in the supine position under the operating microscope.  The left eye was identified as the operative eye and it was prepped and draped in the usual sterile ophthalmic fashion.   A 1.0 millimeter clear-corneal paracentesis was made at the 5:00 position. 0.5 ml of preservative-free 1% lidocaine with epinephrine was injected into the anterior chamber.  The anterior chamber was filled with viscoelastic.  A 2.4 millimeter keratome was used to make a near-clear corneal incision at the 2:00 position.  A curvilinear capsulorrhexis was made with a cystotome and capsulorrhexis forceps.  Balanced salt solution was used to hydrodissect and hydrodelineate the nucleus.   Phacoemulsification was then used in stop and chop fashion to remove the lens nucleus and epinucleus.  The remaining cortex was then removed using the irrigation and aspiration handpiece. Viscoelastic was then placed into the capsular bag to  distend it for lens placement.  A lens was then injected into the capsular bag.  The remaining viscoelastic was aspirated.   Wounds were hydrated with balanced salt solution.  The anterior chamber was inflated to a physiologic pressure with balanced salt solution.  Intracameral vigamox 0.1 mL undiltued was injected into the eye and a drop placed onto the ocular surface.  No wound leaks were noted.  The patient was taken to the recovery room in stable condition without complications of anesthesia or surgery  Willey Blade 12/13/2022, 11:47 AM

## 2022-12-13 NOTE — Anesthesia Postprocedure Evaluation (Signed)
Anesthesia Post Note  Patient: Chartered certified accountant  Procedure(s) Performed: CATARACT EXTRACTION PHACO AND INTRAOCULAR LENS PLACEMENT (IOC) LEFT 4.64 00:38.7 (Left)  Patient location during evaluation: PACU Anesthesia Type: MAC Level of consciousness: awake and alert Pain management: pain level controlled Vital Signs Assessment: post-procedure vital signs reviewed and stable Respiratory status: spontaneous breathing, nonlabored ventilation, respiratory function stable and patient connected to nasal cannula oxygen Cardiovascular status: stable and blood pressure returned to baseline Postop Assessment: no apparent nausea or vomiting Anesthetic complications: no   No notable events documented.   Last Vitals:  Vitals:   12/13/22 1150 12/13/22 1155  BP: 129/67 138/61  Pulse: 60 60  Resp: 14 15  Temp: 36.6 C 36.6 C  SpO2: 96% 97%    Last Pain:  Vitals:   12/13/22 1155  TempSrc: Temporal  PainSc: 0-No pain                 Royale Lennartz C Dayjah Selman

## 2022-12-13 NOTE — Transfer of Care (Signed)
Immediate Anesthesia Transfer of Care Note  [name]  Procedure(s) Performed: CATARACT EXTRACTION PHACO AND INTRAOCULAR LENS PLACEMENT (IOC) LEFT 4.64 00:38.7 (Left)  Patient Location: PACU  Anesthesia Type: MAC  Level of Consciousness: awake, alert  and patient cooperative  Airway and Oxygen Therapy: Patient Spontanous Breathing and Patient connected to supplemental oxygen  Post-op Assessment: Post-op Vital signs reviewed, Patient's Cardiovascular Status Stable, Respiratory Function Stable, Patent Airway and No signs of Nausea or vomiting  Post-op Vital Signs: Reviewed and stable  Complications: No notable events documented.

## 2022-12-13 NOTE — H&P (Signed)
[facility]   Primary Care Physician:  Jacky Kindle, FNP Ophthalmologist: Dr. Willey Blade  Pre-Procedure History & Physical: HPI:  Marissa Hahn is a 77 y.o. female here for cataract surgery.   Past Medical History:  Diagnosis Date   Anxiety    Arthritis    Hyperlipidemia    Hypertension    Renal artery stenosis [facility])     Past Surgical History:  Procedure Laterality Date   ABDOMINAL HYSTERECTOMY  1990   Abdominal   CARPAL TUNNEL RELEASE Left 01/2010   CATARACT EXTRACTION W/PHACO Right 11/29/2022   Procedure: CATARACT EXTRACTION PHACO AND INTRAOCULAR LENS PLACEMENT (IOC) RIGHT  5.85  00:47.2;  Surgeon: Nevada Crane, MD;  [facility]: [facility];  Service: Ophthalmology;  Laterality: Right;    Prior to Admission medications   Medication Sig Start Date End Date Taking? Authorizing Provider  amLODipine (NORVASC) 10 MG tablet TAKE 1 TABLET DAILY 07/26/22  Yes Jacky Kindle, FNP  bisoprolol (ZEBETA) 10 MG tablet Take 2 tablets (20 mg total) by mouth daily. 12/18/21  Yes Hammock, Lavonna Rua, NP  doxazosin (CARDURA) 1 MG tablet Take 1 tablet (1 mg total) by mouth 2 (two) times daily. 06/02/22  Yes Gollan, Tollie Pizza, MD  fluticasone (FLONASE) 50 MCG/ACT nasal spray Place 2 sprays into both nostrils daily. 06/09/21  Yes Jacky Kindle, FNP  losartan (COZAAR) 100 MG tablet Take 1 tablet (100 mg total) by mouth daily. 12/18/21 12/13/22 Yes Hammock, Sheri, NP  Vitamin D, Ergocalciferol, (DRISDOL) 1.25 MG (50000 UNIT) CAPS capsule Take 1 capsule (50,000 Units total) by mouth every 7 (seven) days. Need Vit D lab draw for additional refills; this was filled as a courtesy. 06/09/21  Yes Jacky Kindle, FNP  zolpidem (AMBIEN) 5 MG tablet Take 1 tablet (5 mg total) by mouth at bedtime as needed for sleep. 09/22/22  Yes Jacky Kindle, FNP  rosuvastatin (CRESTOR) 20 MG tablet Take 1 tablet (20 mg total) by mouth daily. Patient not taking: Reported on 11/25/2022 04/09/22   Merita Norton T, FNP     Allergies as of 11/17/2022 - Review Complete 10/05/2022  Allergen Reaction Noted   Acetaminophen-codeine  12/31/2014    Family History  Problem Relation Age of Onset   Hypertension Mother    Lung cancer Father    Healthy Other    Hypertension Sister    Hypertension Sister    Hypertension Sister    Breast cancer Neg Hx     Social History   Socioeconomic History   Marital status: Divorced    Spouse name: Not on file   Number of children: 2   Years of education: H/S   Highest education level: 12th grade  Occupational History   Occupation: Retired  Tobacco Use   Smoking status: Never   Smokeless tobacco: Never  Vaping Use   Vaping status: Never Used  Substance and Sexual Activity   Alcohol use: Not Currently   Drug use: No   Sexual activity: Not on file  Other Topics Concern   Not on file  Social History Narrative   Not on file   Social Determinants of Health   Financial Resource Strain: Low Risk  (10/05/2022)   Overall Financial Resource Strain (CARDIA)    Difficulty of Paying Living Expenses: Not hard at all  Food Insecurity: No Food Insecurity (10/05/2022)   Hunger Vital Sign    Worried About Running Out of Food in the Last Year: Never true  Ran Out of Food in the Last Year: Never true  Transportation Needs: No Transportation Needs (10/05/2022)   PRAPARE - Administrator, Civil Service (Medical): No    Lack of Transportation (Non-Medical): No  Physical Activity: Insufficiently Active (10/05/2022)   Exercise Vital Sign    Days of Exercise per Week: 3 days    Minutes of Exercise per Session: 30 min  Stress: No Stress Concern Present (10/05/2022)   [employer] of Occupational Health - Occupational Stress Questionnaire    Feeling of Stress : Not at all  Social Connections: Moderately Isolated (10/05/2022)   Social Connection and Isolation Panel [NHANES]    Frequency of Communication with Friends and Family: More than three times a week     Frequency of Social Gatherings with Friends and Family: Once a week    Attends Religious Services: More than 4 times per year    Active Member of [employer] or Organizations: No    Attends Banker Meetings: Never    Marital Status: Divorced  Catering manager Violence: Not At Risk (10/05/2022)   Humiliation, Afraid, Rape, and Kick questionnaire    Fear of Current or Ex-Partner: No    Emotionally Abused: No    Physically Abused: No    Sexually Abused: No    Review of Systems: See HPI, otherwise negative ROS  Physical Exam: BP (!) 164/71   Pulse 60   Temp (!) 97.2 F (36.2 C) (Temporal)   Resp 18   Ht 5\' 2"  (1.575 m)   Wt 88 kg   SpO2 98%   BMI 35.48 kg/m  General:   Alert, cooperative in NAD Head:  Normocephalic and atraumatic. Respiratory:  Normal work of breathing. Cardiovascular:  RRR  Impression/Plan: Marissa Hahn is here for cataract surgery.  Risks, benefits, limitations, and alternatives regarding cataract surgery have been reviewed with the patient.  Questions have been answered.  All parties agreeable.   Willey Blade, MD  12/13/2022, 11:21 AM

## 2022-12-27 ENCOUNTER — Other Ambulatory Visit: Payer: Self-pay | Admitting: Family Medicine

## 2022-12-27 DIAGNOSIS — F5101 Primary insomnia: Secondary | ICD-10-CM

## 2022-12-29 NOTE — Telephone Encounter (Signed)
Requested medication (s) are due for refill today: yes  Requested medication (s) are on the active medication list: yes  Last refill:  09/22/22 #30 2 RF  Future visit scheduled: no  Notes to clinic:  med not delegated to NT to RF   Requested Prescriptions  Pending Prescriptions Disp Refills   zolpidem (AMBIEN) 5 MG tablet [Pharmacy Med Name: ZOLPIDEM 5 MG TAB] 30 tablet 2    Sig: TAKE ONE TABLET BY MOUTH AT BEDTIME AS NEEDED FOR SLEEP     Not Delegated - Psychiatry:  Anxiolytics/Hypnotics Failed - 12/27/2022  4:00 PM      Failed - This refill cannot be delegated      Failed - Urine Drug Screen completed in last 360 days      Passed - Valid encounter within last 6 months    Recent Outpatient Visits           8 months ago Annual physical exam   [facility] Jacky Kindle, FNP   1 year ago Uncontrolled hypertension   [place] [facility] Jacky Kindle, FNP   1 year ago Primary hypertension   [facility] Jacky Kindle, FNP   1 year ago Essential hypertension   [place] [facility] Merita Norton T, FNP   1 year ago Blood glucose elevated   [facility] Jacky Kindle, [place]

## 2022-12-31 ENCOUNTER — Other Ambulatory Visit: Payer: Self-pay | Admitting: Family Medicine

## 2022-12-31 NOTE — Telephone Encounter (Signed)
Medication Refill - Medication: losartan (COZAAR) 100 MG tablet   Has the patient contacted their pharmacy? Yes.    (Agent: If yes, when and what did the pharmacy advise?)  Preferred Pharmacy (with phone number or street name):  [facility] - [place], [place] - [address] [facility] AT Portal to Registered [facility] Sites  [address] [place] [address]  Phone: [phone] Fax: [phone]  Hours: Not open 24 hours   Has the patient been seen for an appointment in the last year OR does the patient have an upcoming appointment? Yes.    Agent: Please be advised that RX refills may take up to 3 business days. We ask that you follow-up with your pharmacy.

## 2023-01-03 ENCOUNTER — Other Ambulatory Visit: Payer: Self-pay | Admitting: Family Medicine

## 2023-01-03 MED ORDER — LOSARTAN POTASSIUM 100 MG PO TABS: 100.0000 mg | ORAL_TABLET | Freq: Every day | ORAL | 0 refills | Status: DC

## 2023-01-03 NOTE — Telephone Encounter (Signed)
Requested medications are due for refill today.  yes  Requested medications are on the active medications list.  yes  Last refill 12/13/2022 #90 3 rf  Future visit scheduled.   yes  Notes to clinic.  Rx signed by Charlsie Quest. Rx written to expire 12/13/2022 - rx is expired.    Requested Prescriptions  Pending Prescriptions Disp Refills   losartan (COZAAR) 100 MG tablet 90 tablet 3    Sig: Take 1 tablet (100 mg total) by mouth daily.     Cardiovascular:  Angiotensin Receptor Blockers Failed - 12/31/2022  2:48 PM      Failed - Cr in normal range and within 180 days    Creatinine, Ser  Date Value Ref Range Status  04/08/2022 0.75 0.57 - 1.00 mg/dL Final         Failed - K in normal range and within 180 days    Potassium  Date Value Ref Range Status  04/08/2022 3.8 3.5 - 5.2 mmol/L Final         Passed - Patient is not pregnant      Passed - Last BP in normal range    BP Readings from Last 1 Encounters:  12/13/22 138/61         Passed - Valid encounter within last 6 months    Recent Outpatient Visits           9 months ago Annual physical exam   [facility] Jacky Kindle, FNP   1 year ago Uncontrolled hypertension   [place] [facility] Jacky Kindle, FNP   1 year ago Primary hypertension   [place] [facility] Jacky Kindle, FNP   1 year ago Essential hypertension   [place] [facility] Merita Norton T, FNP   1 year ago Blood glucose elevated   [facility] Jacky Kindle, [place]

## 2023-01-29 ENCOUNTER — Other Ambulatory Visit: Payer: Self-pay | Admitting: Cardiology

## 2023-01-29 ENCOUNTER — Other Ambulatory Visit: Payer: Self-pay | Admitting: Family Medicine

## 2023-01-29 DIAGNOSIS — I10 Essential (primary) hypertension: Secondary | ICD-10-CM

## 2023-01-31 NOTE — Telephone Encounter (Signed)
Requested medications are due for refill today.  yes  Requested medications are on the active medications list.  yes  Last refill. 07/26/2022 #90 1 rf  Future visit scheduled.   no  Notes to clinic.  Pt last seen 04/08/2022. Pt is more than 3 months over due for OV. Called pt - left message to return call and schedule ov.    Requested Prescriptions  Pending Prescriptions Disp Refills   amLODipine (NORVASC) 10 MG tablet [Pharmacy Med Name: AMLODIPINE TAB 10MG ] 90 tablet 1    Sig: TAKE 1 TABLET DAILY     Cardiovascular: Calcium Channel Blockers 2 Passed - 01/29/2023  2:02 PM      Passed - Last BP in normal range    BP Readings from Last 1 Encounters:  12/13/22 138/61         Passed - Last Heart Rate in normal range    Pulse Readings from Last 1 Encounters:  12/13/22 60         Passed - Valid encounter within last 6 months    Recent Outpatient Visits           9 months ago Annual physical exam   [facility] Jacky Kindle, FNP   1 year ago Uncontrolled hypertension   [place] [facility] Jacky Kindle, FNP   1 year ago Primary hypertension   [facility] Jacky Kindle, FNP   1 year ago Essential hypertension   [place] [facility] Merita Norton T, FNP   2 years ago Blood glucose elevated   [facility] Jacky Kindle, [place]

## 2023-01-31 NOTE — Telephone Encounter (Signed)
Called pt - left message on machine to return call and schedule OV.

## 2023-02-10 ENCOUNTER — Other Ambulatory Visit: Payer: Self-pay

## 2023-02-11 NOTE — Patient Outreach (Signed)
  Care Management   Visit Note   Name: Marissa Hahn MRN: [id number] DOB: 07-30-1945  Subjective: Marissa Hahn is a 77 y.o. year old female who is a primary care patient of Jacky Kindle, FNP. The Care Management team was consulted for assistance.      Engaged with patient via telephone to discuss care management needs. [name] agrees to completing outreach on February 25, 2023. Agreed to contact clinic if she requires assistance prior to scheduled phone visit.  PLAN Will follow up on February 25, 2023   Juanell Fairly [facility]  [facility] RN Care Manager Direct Dial: [phone] Website: [email or web address]

## 2023-02-25 ENCOUNTER — Other Ambulatory Visit: Payer: Self-pay

## 2023-02-28 NOTE — Patient Outreach (Signed)
  Care Management   Visit Note   Name: Marissa Hahn MRN: [id number] DOB: 1945-06-22  Subjective: Marissa Hahn is a 77 y.o. year old female who is a primary care patient of Jacky Kindle, FNP. The Care Management team was consulted for assistance.      Engaged with patient via telephone.  Assessment:  Outpatient Encounter Medications as of 02/25/2023  Medication Sig Note   amLODipine (NORVASC) 10 MG tablet TAKE 1 TABLET DAILY    bisoprolol (ZEBETA) 10 MG tablet TAKE 2 TABLETS DAILY    doxazosin (CARDURA) 1 MG tablet Take 1 tablet (1 mg total) by mouth 2 (two) times daily. 02/25/2023: Reports being informed by Cardiologist that medication can be taken once or twice depending on her BP   losartan (COZAAR) 100 MG tablet Take 1 tablet (100 mg total) by mouth daily.    rosuvastatin (CRESTOR) 20 MG tablet Take 1 tablet (20 mg total) by mouth daily.    zolpidem (AMBIEN) 5 MG tablet TAKE ONE TABLET BY MOUTH AT BEDTIME AS NEEDED FOR SLEEP    fluticasone (FLONASE) 50 MCG/ACT nasal spray Place 2 sprays into both nostrils daily. 02/25/2023: Pending receipt from [facility]   Vitamin D, Ergocalciferol, (DRISDOL) 1.25 MG (50000 UNIT) CAPS capsule Take 1 capsule (50,000 Units total) by mouth every 7 (seven) days. Need Vit D lab draw for additional refills; this was filled as a courtesy. (Patient not taking: Reported on 02/25/2023)    No facility-administered encounter medications on file as of 02/25/2023.

## 2023-03-11 ENCOUNTER — Other Ambulatory Visit: Payer: Self-pay

## 2023-03-11 ENCOUNTER — Telehealth: Payer: Self-pay | Admitting: Family Medicine

## 2023-03-11 DIAGNOSIS — I10 Essential (primary) hypertension: Secondary | ICD-10-CM

## 2023-03-11 NOTE — Telephone Encounter (Signed)
Recevied [facility] asking for refills for Amlodipine Besylate 10 mg.

## 2023-03-14 ENCOUNTER — Telehealth: Payer: Self-pay | Admitting: Family Medicine

## 2023-03-14 DIAGNOSIS — I10 Essential (primary) hypertension: Secondary | ICD-10-CM

## 2023-03-14 MED ORDER — AMLODIPINE BESYLATE 10 MG PO TABS
10.0000 mg | ORAL_TABLET | Freq: Every day | ORAL | 0 refills | Status: DC
Start: 2023-03-14 — End: 2023-04-06

## 2023-03-14 NOTE — Telephone Encounter (Signed)
[facility] is asking for refills on Amlodipine Besylate 10 mg.

## 2023-03-14 NOTE — Telephone Encounter (Signed)
Medication Refill -  Most Recent Primary Care Visit:  Provider: Sue Lush  Department: [facility]  Visit Type: MEDICARE AWV, SEQUENTIAL  Date: 10/05/2022  Medication: amLODipine (NORVASC) 10 MG tablet   Has the patient contacted their pharmacy? yes (Agent: If yes, when and what did the pharmacy advise?)contact pcp  Is this the correct pharmacy for this prescription? yes If no, delete pharmacy and type the correct one.  This is the patient's preferred pharmacy:   [facility] - [place], [place] - [address] [facility] AT Portal to Registered [facility] Sites [address] [place] [address] Phone: [phone] Fax: [phone]    Has the prescription been filled recently? no  Is the patient out of the medication? yes  Has the patient been seen for an appointment in the last year OR does the patient have an upcoming appointment? yes  Can we respond through MyChart? Yes 04/08/22 but she now has appt scheduled for 12/11  Agent: Please be advised that Rx refills may take up to 3 business days. We ask that you follow-up with your pharmacy.

## 2023-03-30 ENCOUNTER — Encounter: Payer: Self-pay | Admitting: Family Medicine

## 2023-03-30 ENCOUNTER — Ambulatory Visit (INDEPENDENT_AMBULATORY_CARE_PROVIDER_SITE_OTHER): Payer: No Typology Code available for payment source | Admitting: Family Medicine

## 2023-03-30 VITALS — BP 151/66 | HR 56 | Ht 62.0 in | Wt 198.0 lb

## 2023-03-30 DIAGNOSIS — Z23 Encounter for immunization: Secondary | ICD-10-CM

## 2023-03-30 DIAGNOSIS — E7800 Pure hypercholesterolemia, unspecified: Secondary | ICD-10-CM

## 2023-03-30 DIAGNOSIS — F5101 Primary insomnia: Secondary | ICD-10-CM | POA: Diagnosis not present

## 2023-03-30 DIAGNOSIS — R7303 Prediabetes: Secondary | ICD-10-CM

## 2023-03-30 DIAGNOSIS — I701 Atherosclerosis of renal artery: Secondary | ICD-10-CM | POA: Diagnosis not present

## 2023-03-30 DIAGNOSIS — M810 Age-related osteoporosis without current pathological fracture: Secondary | ICD-10-CM | POA: Diagnosis not present

## 2023-03-30 DIAGNOSIS — I10 Essential (primary) hypertension: Secondary | ICD-10-CM | POA: Diagnosis not present

## 2023-03-30 DIAGNOSIS — E559 Vitamin D deficiency, unspecified: Secondary | ICD-10-CM | POA: Diagnosis not present

## 2023-03-30 MED ORDER — ZOLPIDEM TARTRATE 10 MG PO TABS
10.0000 mg | ORAL_TABLET | Freq: Every evening | ORAL | 3 refills | Status: DC | PRN
Start: 2023-03-30 — End: 2023-11-01

## 2023-03-30 NOTE — Assessment & Plan Note (Signed)
Chronic, uncontrolled Reports she has run out of ambien given taking 2-5 mg tablets nightly to assist [facility] given no insurance coverage d/t age Continue to monitor

## 2023-03-30 NOTE — Progress Notes (Signed)
Established patient visit   [name]   DOB: [birth year]   77 y.o. Female  MRN: [id number] Visit Date: 03/30/2023  Today's healthcare provider: Jacky Kindle, FNP  Introduced to nurse practitioner role and practice setting.  All questions answered.  Discussed provider/patient relationship and expectations.  Chief Complaint  Patient presents with   Follow-up    Medication refill Pt requesting Rx vitamin D   Subjective    HPI HPI     Follow-up    Additional comments: Medication refill Pt requesting Rx vitamin D      Last edited by Shelly Bombard, CMA on 03/30/2023  1:04 PM.      Medications: Outpatient Medications Prior to Visit  Medication Sig   amLODipine (NORVASC) 10 MG tablet Take 1 tablet (10 mg total) by mouth daily.   bisoprolol (ZEBETA) 10 MG tablet TAKE 2 TABLETS DAILY   doxazosin (CARDURA) 1 MG tablet Take 1 tablet (1 mg total) by mouth 2 (two) times daily.   losartan (COZAAR) 100 MG tablet Take 1 tablet (100 mg total) by mouth daily.   rosuvastatin (CRESTOR) 20 MG tablet Take 1 tablet (20 mg total) by mouth daily.   [DISCONTINUED] zolpidem (AMBIEN) 5 MG tablet TAKE ONE TABLET BY MOUTH AT BEDTIME AS NEEDED FOR SLEEP   fluticasone (FLONASE) 50 MCG/ACT nasal spray Place 2 sprays into both nostrils daily. (Patient not taking: Reported on 03/30/2023)   [DISCONTINUED] Vitamin D, Ergocalciferol, (DRISDOL) 1.25 MG (50000 UNIT) CAPS capsule Take 1 capsule (50,000 Units total) by mouth every 7 (seven) days. Need Vit D lab draw for additional refills; this was filled as a courtesy. (Patient not taking: Reported on 02/25/2023)   No facility-administered medications prior to visit.    Review of Systems Last CBC Lab Results  Component Value Date   WBC 10.5 04/08/2022   HGB 13.5 04/08/2022   HCT 41.6 04/08/2022   MCV 85 04/08/2022   MCH 27.6 04/08/2022   RDW 12.5 04/08/2022   PLT 367 04/08/2022   Last metabolic panel Lab Results  Component Value  Date   GLUCOSE 102 (H) 04/08/2022   NA 140 04/08/2022   K 3.8 04/08/2022   CL 102 04/08/2022   CO2 22 04/08/2022   BUN 6 (L) 04/08/2022   CREATININE 0.75 04/08/2022   EGFR 83 04/08/2022   CALCIUM 9.8 04/08/2022   PROT 7.7 04/08/2022   ALBUMIN 4.6 04/08/2022   LABGLOB 3.1 04/08/2022   AGRATIO 1.5 04/08/2022   BILITOT 0.5 04/08/2022   ALKPHOS 124 (H) 04/08/2022   AST 14 04/08/2022   ALT 9 04/08/2022   Last lipids Lab Results  Component Value Date   CHOL 187 04/08/2022   HDL 49 04/08/2022   LDLCALC 116 (H) 04/08/2022   TRIG 122 04/08/2022   CHOLHDL 3.8 04/08/2022   Last hemoglobin A1c Lab Results  Component Value Date   HGBA1C 6.1 (H) 04/08/2022   Last thyroid functions Lab Results  Component Value Date   TSH 1.820 10/30/2021   Last vitamin D Lab Results  Component Value Date   VD25OH 38.1 04/08/2022   Last vitamin B12 and Folate No results found for: "VITAMINB12", "FOLATE"    Objective    BP (!) 151/66   Pulse (!) 56   Ht 5\' 2"  (1.575 m)   Wt 198 lb (89.8 kg)   BMI 36.21 kg/m   BP Readings from Last 3 Encounters:  03/30/23 (!) 151/66  12/13/22 138/61  11/29/22 Marland Kitchen)  129/58   Wt Readings from Last 3 Encounters:  03/30/23 198 lb (89.8 kg)  12/13/22 194 lb 0.1 oz (88 kg)  11/29/22 196 lb (88.9 kg)   SpO2 Readings from Last 3 Encounters:  12/13/22 97%  11/29/22 95%  06/02/22 98%   Physical Exam Vitals and nursing note reviewed.  Constitutional:      General: She is not in acute distress.    Appearance: Normal appearance. She is obese. She is not ill-appearing, toxic-appearing or diaphoretic.  HENT:     Head: Normocephalic and atraumatic.  Cardiovascular:     Rate and Rhythm: Normal rate and regular rhythm.     Pulses: Normal pulses.     Heart sounds: Normal heart sounds. No murmur heard.    No friction rub. No gallop.  Pulmonary:     Effort: Pulmonary effort is normal. No respiratory distress.     Breath sounds: Normal breath sounds. No  stridor. No wheezing, rhonchi or rales.  Chest:     Chest wall: No tenderness.  Musculoskeletal:        General: No swelling, tenderness, deformity or signs of injury. Normal range of motion.     Right lower leg: No edema.     Left lower leg: No edema.  Skin:    General: Skin is warm and dry.     Capillary Refill: Capillary refill takes less than 2 seconds.     Coloration: Skin is not jaundiced or pale.     Findings: No bruising, erythema, lesion or rash.  Neurological:     General: No focal deficit present.     Mental Status: She is alert and oriented to person, place, and time. Mental status is at baseline.     Cranial Nerves: No cranial nerve deficit.     Sensory: No sensory deficit.     Motor: No weakness.     Coordination: Coordination normal.  Psychiatric:        Mood and Affect: Mood normal.        Behavior: Behavior normal.        Thought Content: Thought content normal.        Judgment: Judgment normal.     No results found for any visits on 03/30/23.  Assessment & Plan     Problem List Items Addressed This Visit       Cardiovascular and Mediastinum   Renal artery stenosis (HCC)    Chronic, interval elevation in BP Recommend 1 month f/u Continue to monitor at home and if remains elevated- bring home logs and home machine to OV      Relevant Orders   CBC with Differential/Platelet   Comprehensive Metabolic Panel (CMET)   TSH   Lipid panel   Urine Microalbumin w/creat. ratio   Uncontrolled hypertension - Primary    Chronic, remains elevated today x 2 attempts Pt notes separation in med dosing times to assist with BP Reports upcoming appt with cardiology Goal remains 129/89        Other   Avitaminosis D    Chronic, unknown Declines start of fosamax or similar for known OP Request for refills of Vit D      Relevant Orders   Vitamin D (25 hydroxy)   Cannot sleep    Chronic, uncontrolled Reports she has run out of [place] given taking 2-5 mg tablets  nightly to assist [facility] refills [facility] given no insurance coverage d/t age Continue to monitor       Relevant Medications  zolpidem (AMBIEN) 10 MG tablet   Elevated LDL cholesterol level    Chronic, unknown Repeat LP The 10-year ASCVD risk score (Arnett DK, et al., 2019) is: 17.4% recommend diet low in saturated fat and regular exercise - 30 min at least 5 times per week       Relevant Orders   Lipid panel   Prediabetes    Chronic, unknown Repeat A1c Continue to recommend balanced, lower carb meals. Smaller meal size, adding snacks. Choosing water as drink of choice and increasing purposeful exercise.       Relevant Orders   Hemoglobin A1c   Urine Microalbumin w/creat. ratio   Other Visit Diagnoses     Primary hypertension       Relevant Orders   CBC with Differential/Platelet   Comprehensive Metabolic Panel (CMET)   TSH   Urine Microalbumin w/creat. ratio   Age-related osteoporosis without current pathological fracture       Relevant Orders   Vitamin D (25 hydroxy)   B12 and Folate Panel      Return in about 4 weeks (around 04/27/2023) for annual examination.     Leilani Merl, FNP, have reviewed all documentation for this visit. The documentation on 03/30/23 for the exam, diagnosis, procedures, and orders are all accurate and complete.  Jacky Kindle, FNP  [facility] [phone] (phone) [phone] (fax)  [facility]

## 2023-03-30 NOTE — Assessment & Plan Note (Signed)
Chronic, remains elevated today x 2 attempts Pt notes separation in med dosing times to assist with BP Reports upcoming appt with cardiology Goal remains 129/89

## 2023-03-30 NOTE — Patient Instructions (Signed)
The CDC recommends two doses of Shingrix (the new shingles vaccine) separated by 2 to 6 months for adults age 75 years and older. I recommend checking with your insurance plan regarding coverage for this vaccine.    

## 2023-03-30 NOTE — Assessment & Plan Note (Signed)
Chronic, unknown Declines start of fosamax or similar for known OP Request for refills of Vit D

## 2023-03-30 NOTE — Assessment & Plan Note (Signed)
Chronic, unknown Repeat A1c Continue to recommend balanced, lower carb meals. Smaller meal size, adding snacks. Choosing water as drink of choice and increasing purposeful exercise.  

## 2023-03-30 NOTE — Assessment & Plan Note (Signed)
Chronic, interval elevation in BP Recommend 1 month f/u Continue to monitor at home and if remains elevated- bring home logs and home machine to OV

## 2023-03-30 NOTE — Assessment & Plan Note (Signed)
Chronic, unknown Repeat LP The 10-year ASCVD risk score (Arnett DK, et al., 2019) is: 17.4% recommend diet low in saturated fat and regular exercise - 30 min at least 5 times per week

## 2023-03-31 LAB — COMPREHENSIVE METABOLIC PANEL
ALT: 14 [IU]/L (ref 0–32)
AST: 16 [IU]/L (ref 0–40)
Albumin: 4.3 g/dL (ref 3.8–4.8)
Alkaline Phosphatase: 121 [IU]/L (ref 44–121)
BUN/Creatinine Ratio: 10 — ABNORMAL LOW (ref 12–28)
BUN: 8 mg/dL (ref 8–27)
Bilirubin Total: 0.5 mg/dL (ref 0.0–1.2)
CO2: 22 mmol/L (ref 20–29)
Calcium: 9.5 mg/dL (ref 8.7–10.3)
Chloride: 104 mmol/L (ref 96–106)
Creatinine, Ser: 0.77 mg/dL (ref 0.57–1.00)
Globulin, Total: 2.7 g/dL (ref 1.5–4.5)
Glucose: 109 mg/dL — ABNORMAL HIGH (ref 70–99)
Potassium: 4.1 mmol/L (ref 3.5–5.2)
Sodium: 140 mmol/L (ref 134–144)
Total Protein: 7 g/dL (ref 6.0–8.5)
eGFR: 80 mL/min/{1.73_m2} (ref >59)

## 2023-03-31 LAB — CBC WITH DIFFERENTIAL/PLATELET
Basophils Absolute: 0 10*3/uL (ref 0.0–0.2)
Basos: 0 %
EOS (ABSOLUTE): 0.2 10*3/uL (ref 0.0–0.4)
Eos: 2 %
Hematocrit: 39.9 % (ref 34.0–46.6)
Hemoglobin: 12.3 g/dL (ref 11.1–15.9)
Immature Grans (Abs): 0 10*3/uL (ref 0.0–0.1)
Immature Granulocytes: 0 %
Lymphocytes Absolute: 4.1 10*3/uL — ABNORMAL HIGH (ref 0.7–3.1)
Lymphs: 43 %
MCH: 27.6 pg (ref 26.6–33.0)
MCHC: 30.8 g/dL — ABNORMAL LOW (ref 31.5–35.7)
MCV: 90 fL (ref 79–97)
Monocytes Absolute: 0.6 10*3/uL (ref 0.1–0.9)
Monocytes: 7 %
Neutrophils Absolute: 4.6 10*3/uL (ref 1.4–7.0)
Neutrophils: 48 %
Platelets: 337 10*3/uL (ref 150–450)
RBC: 4.45 x10E6/uL (ref 3.77–5.28)
RDW: 12.4 % (ref 11.7–15.4)
WBC: 9.6 10*3/uL (ref 3.4–10.8)

## 2023-03-31 LAB — B12 AND FOLATE PANEL
Folate: 11.6 ng/mL (ref >3.0)
Vitamin B-12: 217 pg/mL — ABNORMAL LOW (ref 232–1245)

## 2023-03-31 LAB — LIPID PANEL
Chol/HDL Ratio: 2.8 {ratio} (ref 0.0–4.4)
Cholesterol, Total: 128 mg/dL (ref 100–199)
HDL: 45 mg/dL (ref >39)
LDL Chol Calc (NIH): 67 mg/dL (ref 0–99)
Triglycerides: 79 mg/dL (ref 0–149)
VLDL Cholesterol Cal: 16 mg/dL (ref 5–40)

## 2023-03-31 LAB — VITAMIN D 25 HYDROXY (VIT D DEFICIENCY, FRACTURES): Vit D, 25-Hydroxy: 40.1 ng/mL (ref 30.0–100.0)

## 2023-03-31 LAB — HEMOGLOBIN A1C
Est. average glucose Bld gHb Est-mCnc: 131 mg/dL
Hgb A1c MFr Bld: 6.2 % — ABNORMAL HIGH (ref 4.8–5.6)

## 2023-03-31 LAB — MICROALBUMIN / CREATININE URINE RATIO
Creatinine, Urine: 47.5 mg/dL
Microalb/Creat Ratio: 19 mg/g{creat} (ref 0–29)
Microalbumin, Urine: 8.8 ug/mL

## 2023-03-31 LAB — TSH: TSH: 1.93 u[IU]/mL (ref 0.450–4.500)

## 2023-03-31 NOTE — Progress Notes (Signed)
Recommend consult with hematology to assist with B12 replacement. Prediabetes remains stable. Continue to recommend balanced, lower carb meals. Smaller meal size, adding snacks. Choosing water as drink of choice and increasing purposeful exercise. Cholesterol is much improved; I continue to recommend diet low in saturated fat and regular exercise - 30 min at least 5 times per week  Continue to work on blood pressure mgmt.

## 2023-04-06 ENCOUNTER — Other Ambulatory Visit: Payer: Self-pay | Admitting: Family Medicine

## 2023-04-06 DIAGNOSIS — I10 Essential (primary) hypertension: Secondary | ICD-10-CM

## 2023-04-15 ENCOUNTER — Other Ambulatory Visit: Payer: Self-pay | Admitting: Family Medicine

## 2023-04-29 ENCOUNTER — Telehealth: Payer: Self-pay

## 2023-04-29 ENCOUNTER — Other Ambulatory Visit: Payer: Self-pay

## 2023-04-29 NOTE — Patient Outreach (Signed)
  Care Management   Outreach Note  04/29/2023 Name: Marissa Hahn MRN: [id number] DOB: Oct 06, 1945  An unsuccessful outreach attempt was made today for a scheduled Care Management visit.   Follow Up Plan:  A HIPAA compliant phone message was left for the patient providing contact information and requesting a return call.   [facility] RN Care Manager Direct Dial: [phone] Website: [email or web address]

## 2023-05-12 ENCOUNTER — Telehealth: Payer: Self-pay | Admitting: Family Medicine

## 2023-05-12 MED ORDER — ROSUVASTATIN CALCIUM 20 MG PO TABS: 20.0000 mg | ORAL_TABLET | Freq: Every day | ORAL | 3 refills | Status: DC

## 2023-05-12 NOTE — Telephone Encounter (Signed)
[facility] faxed refill request for the following medications:   rosuvastatin (CRESTOR) 20 MG tablet    Please advise.

## 2023-05-13 NOTE — Telephone Encounter (Signed)
Sent to [facility] yesterday via separate encounter.

## 2023-07-28 ENCOUNTER — Encounter: Payer: Self-pay | Admitting: Family Medicine

## 2023-07-28 ENCOUNTER — Ambulatory Visit (INDEPENDENT_AMBULATORY_CARE_PROVIDER_SITE_OTHER): Payer: Self-pay | Admitting: Family Medicine

## 2023-07-28 VITALS — BP 151/63 | HR 67 | Temp 98.6°F | Resp 16 | Ht 62.0 in | Wt 196.0 lb

## 2023-07-28 DIAGNOSIS — E7800 Pure hypercholesterolemia, unspecified: Secondary | ICD-10-CM | POA: Diagnosis not present

## 2023-07-28 DIAGNOSIS — N182 Chronic kidney disease, stage 2 (mild): Secondary | ICD-10-CM

## 2023-07-28 DIAGNOSIS — E559 Vitamin D deficiency, unspecified: Secondary | ICD-10-CM

## 2023-07-28 DIAGNOSIS — Z1231 Encounter for screening mammogram for malignant neoplasm of breast: Secondary | ICD-10-CM | POA: Diagnosis not present

## 2023-07-28 DIAGNOSIS — Z0000 Encounter for general adult medical examination without abnormal findings: Secondary | ICD-10-CM

## 2023-07-28 DIAGNOSIS — J302 Other seasonal allergic rhinitis: Secondary | ICD-10-CM

## 2023-07-28 DIAGNOSIS — Z0001 Encounter for general adult medical examination with abnormal findings: Secondary | ICD-10-CM

## 2023-07-28 DIAGNOSIS — E538 Deficiency of other specified B group vitamins: Secondary | ICD-10-CM

## 2023-07-28 DIAGNOSIS — I10 Essential (primary) hypertension: Secondary | ICD-10-CM

## 2023-07-28 DIAGNOSIS — R7303 Prediabetes: Secondary | ICD-10-CM

## 2023-07-28 DIAGNOSIS — M810 Age-related osteoporosis without current pathological fracture: Secondary | ICD-10-CM | POA: Diagnosis not present

## 2023-07-28 DIAGNOSIS — G4700 Insomnia, unspecified: Secondary | ICD-10-CM

## 2023-07-28 MED ORDER — AZELASTINE HCL 0.1 % NA SOLN
1.0000 | Freq: Every evening | NASAL | 1 refills | Status: DC | PRN
Start: 2023-07-28 — End: 2023-08-22

## 2023-07-28 NOTE — Progress Notes (Addendum)
 Complete physical exam   [name]   DOB: 11-26-1945   78 y.o. Female  MRN: [id number] Visit Date: 07/28/2023  Today's healthcare provider: Sherlyn Hay, DO   Chief Complaint  Patient presents with   Annual Exam    Patient had AWV done 10/05/2022. She is here for he Annual Exam. Overall reports feeling well. She is exercising some.   Subjective    Marissa Hahn is a 78 y.o. female who presents today for a complete physical exam.  She reports consuming a low sodium diet.  She recently started walking about a mile a day a couple weeks  She generally feels well. She reports sleeping fairly well/normal for her. She does not have additional problems to discuss today.  HPI HPI     Annual Exam    Additional comments: Patient had AWV done 10/05/2022. She is here for he Annual Exam. Overall reports feeling well. She is exercising some.      Last edited by Marjie Skiff, CMA on 07/28/2023  9:26 AM.      Marissa Hahn is a 78 year old female who presents for an annual physical exam.  She generally feels well and engages in regular physical activity, such as walking a mile a day.  Her blood pressure has been consistently high, though it is generally stable. She takes losartan, bisoprolol, amlodipine, and doxazosin for blood pressure and rosuvastatin for cholesterol management. There is a family history of high blood pressure, and she was previously evaluated by cardiology, who found her to be doing well. There was a past concern for renal artery stenosis, but no definitive diagnosis was made.  She has not received her COVID booster or shingles vaccine. She plans to get her tetanus vaccine at the pharmacy. She had chickenpox in the past.  She experiences some restless nights due to her part-time work schedule and takes Ambien 5 mg for sleep. A previous prescription for 10 mg tablets had been sent previously, which she had been splitting.  She reports she  currently has Ambien 5 mg tablets at home, but the dispense history states 10 mg tablets had been dispensed last month.  She will be checking at the pharmacy to clarify this, as she states she is certain she has 5 mg tablets.  She finds that exercise helps her sleep better.  She has year-round allergies and sinus issues, which cause headaches, especially with pollen exposure. She takes over-the-counter Claritin (restarted in the last couple of days) and has used fluticasone nasal spray in the past, which she found somewhat helpful. She recently restarted Claritin and is considering switching to Allegra, which she has used before.  She experienced diarrhea in the past with hydrochlorothiazide, which was discontinued.  She also appears to have experienced soft stools with 320 mg valsartan, for which she was switched back to 100 mg losartan.    Past Medical History:  Diagnosis Date   Anxiety    Arthritis    Hyperlipidemia    Hypertension    Renal artery stenosis [facility])    Past Surgical History:  Procedure Laterality Date   ABDOMINAL HYSTERECTOMY  1990   Abdominal   CARPAL TUNNEL RELEASE Left 01/2010   CATARACT EXTRACTION W/PHACO Right 11/29/2022   Procedure: CATARACT EXTRACTION PHACO AND INTRAOCULAR LENS PLACEMENT (IOC) RIGHT  5.85  00:47.2;  Surgeon: Nevada Crane, MD;  Location: [facility];  Service: Ophthalmology;  Laterality: Right;   CATARACT EXTRACTION  W/PHACO Left 12/13/2022   Procedure: CATARACT EXTRACTION PHACO AND INTRAOCULAR LENS PLACEMENT (IOC) LEFT 4.64 00:38.7;  Surgeon: Nevada Crane, MD;  Location: [facility];  Service: Ophthalmology;  Laterality: Left;   Social History   Socioeconomic History   Marital status: Divorced    Spouse name: Not on file   Number of children: 2   Years of education: H/S   Highest education level: 12th grade  Occupational History   Occupation: Retired  Tobacco Use   Smoking status: Never   Smokeless tobacco: Never   Vaping Use   Vaping status: Never Used  Substance and Sexual Activity   Alcohol use: Not Currently   Drug use: No   Sexual activity: Not on file  Other Topics Concern   Not on file  Social History Narrative   Not on file   Social Drivers of Health   Financial Resource Strain: Low Risk  (02/25/2023)   Overall Financial Resource Strain (CARDIA)    Difficulty of Paying Living Expenses: Not very hard  Food Insecurity: No Food Insecurity (02/25/2023)   Hunger Vital Sign    Worried About Running Out of Food in the Last Year: Never true    Ran Out of Food in the Last Year: Never true  Transportation Needs: No Transportation Needs (02/25/2023)   PRAPARE - Administrator, Civil Service (Medical): No    Lack of Transportation (Non-Medical): No  Physical Activity: Insufficiently Active (10/05/2022)   Exercise Vital Sign    Days of Exercise per Week: 3 days    Minutes of Exercise per Session: 30 min  Stress: No Stress Concern Present (02/25/2023)   [employer] of Occupational Health - Occupational Stress Questionnaire    Feeling of Stress : Not at all  Social Connections: Moderately Integrated (02/25/2023)   Social Connection and Isolation Panel [NHANES]    Frequency of Communication with Friends and Family: More than three times a week    Frequency of Social Gatherings with Friends and Family: More than three times a week    Attends Religious Services: More than 4 times per year    Active Member of [employer] or Organizations: Yes    Attends Banker Meetings: Never    Marital Status: Divorced  Catering manager Violence: Not At Risk (10/05/2022)   Humiliation, Afraid, Rape, and Kick questionnaire    Fear of Current or Ex-Partner: No    Emotionally Abused: No    Physically Abused: No    Sexually Abused: No   Family Status  Relation Name Status   Mother  Deceased   Father  Deceased at age Mid 83's       Lung Cancer   Other 6 Siblings Alive   Sister  Armed forces training and education officer  Deceased   Daughter  Alive   Sister  Alive   Sister  Alive   Brother  Alive   Brother  Alive   Daughter  Alive   Neg Hx  (Not Specified)  No partnership data on file   Family History  Problem Relation Age of Onset   Hypertension Mother    Lung cancer Father    Healthy Other    Hypertension Sister    Hypertension Sister    Hypertension Sister    Breast cancer Neg Hx    Allergies  Allergen Reactions   Acetaminophen-Codeine Itching    Patient Care Team: Cage Gupton, Monico Blitz, DO as PCP - General (Family Medicine) Mariah Milling, Tollie Pizza, MD  as PCP - Cardiology (Cardiology) Pa, [facility]   Medications: Outpatient Medications Prior to Visit  Medication Sig   amLODipine (NORVASC) 10 MG tablet TAKE 1 TABLET BY MOUTH EVERY DAY   bisoprolol (ZEBETA) 10 MG tablet TAKE 2 TABLETS DAILY   doxazosin (CARDURA) 1 MG tablet Take 1 tablet (1 mg total) by mouth 2 (two) times daily.   loratadine (CLARITIN) 10 MG tablet Take 10 mg by mouth daily.   losartan (COZAAR) 100 MG tablet TAKE 1 TABLET BY MOUTH EVERY DAY   rosuvastatin (CRESTOR) 20 MG tablet Take 1 tablet (20 mg total) by mouth daily.   zolpidem (AMBIEN) 10 MG tablet Take 1 tablet (10 mg total) by mouth at bedtime as needed. for sleep (Patient taking differently: Take 5 mg by mouth at bedtime as needed for sleep. for sleep)   [DISCONTINUED] azelastine (ASTELIN) 0.1 % nasal spray Place 1 spray into both nostrils at bedtime as needed for rhinitis or allergies. Use in each nostril as directed   [DISCONTINUED] fluticasone (FLONASE) 50 MCG/ACT nasal spray Place 2 sprays into both nostrils daily.   No facility-administered medications prior to visit.    Review of Systems  Constitutional:  Negative for chills, fatigue and fever.  HENT:  Positive for rhinorrhea (with allergies). Negative for congestion, ear pain, sneezing and sore throat.   Eyes: Negative.  Negative for pain and redness.  Respiratory:  Negative for cough,  shortness of breath and wheezing.   Cardiovascular:  Negative for chest pain and leg swelling.  Gastrointestinal:  Negative for abdominal pain, blood in stool, constipation, diarrhea and nausea.  Endocrine: Negative for polydipsia and polyphagia.  Genitourinary: Negative.  Negative for dysuria, flank pain, hematuria, pelvic pain, vaginal bleeding and vaginal discharge.  Musculoskeletal:  Negative for arthralgias, back pain, gait problem and joint swelling.  Skin:  Negative for rash.  Neurological:  Positive for headaches (occasional). Negative for dizziness, tremors, seizures, weakness, light-headedness and numbness.  Hematological:  Negative for adenopathy.  Psychiatric/Behavioral: Negative.  Negative for behavioral problems, confusion and dysphoric mood. The patient is not nervous/anxious and is not hyperactive.       Objective    BP (!) 151/63 (BP Location: Right Arm, Patient Position: Sitting, Cuff Size: Large)   Pulse 67   Temp 98.6 F (37 C) (Oral)   Resp 16   Ht 5\' 2"  (1.575 m)   Wt 196 lb (88.9 kg)   SpO2 99%   BMI 35.85 kg/m    Physical Exam Vitals and nursing note reviewed.  Constitutional:      General: She is awake.     Appearance: Normal appearance.  HENT:     Head: Normocephalic and atraumatic.     Right Ear: Tympanic membrane, ear canal and external ear normal.     Left Ear: Tympanic membrane, ear canal and external ear normal.     Nose: Nose normal.     Mouth/Throat:     Mouth: Mucous membranes are moist.     Pharynx: Oropharynx is clear. No oropharyngeal exudate or posterior oropharyngeal erythema.  Eyes:     General: No scleral icterus.    Extraocular Movements: Extraocular movements intact.     Conjunctiva/sclera: Conjunctivae normal.     Pupils: Pupils are equal, round, and reactive to light.  Neck:     Thyroid: No thyromegaly or thyroid tenderness.  Cardiovascular:     Rate and Rhythm: Normal rate and regular rhythm.     Pulses: Normal pulses.  Heart sounds: Normal heart sounds.  Pulmonary:     Effort: Pulmonary effort is normal. No tachypnea, bradypnea or respiratory distress.     Breath sounds: Normal breath sounds. No stridor. No wheezing, rhonchi or rales.  Abdominal:     General: Bowel sounds are normal. There is no distension.     Palpations: Abdomen is soft. There is no mass.     Tenderness: There is no abdominal tenderness. There is no guarding.     Hernia: No hernia is present.  Musculoskeletal:     Cervical back: Normal range of motion and neck supple.     Right lower leg: No edema.     Left lower leg: No edema.  Lymphadenopathy:     Cervical: No cervical adenopathy.  Skin:    General: Skin is warm and dry.  Neurological:     Mental Status: She is alert and oriented to person, place, and time. Mental status is at baseline.  Psychiatric:        Mood and Affect: Mood normal.        Behavior: Behavior normal.      Last depression screening scores    02/25/2023    9:30 AM 10/05/2022    3:45 PM 04/08/2022    4:12 PM  PHQ 2/9 Scores  PHQ - 2 Score 0 0 1  PHQ- 9 Score   8   Last fall risk screening    10/05/2022    3:40 PM  Fall Risk   Falls in the past year? 0  Number falls in past yr: 0  Injury with Fall? 0  Risk for fall due to : No Fall Risks  Follow up Falls prevention discussed;Education provided   Last Audit-C alcohol use screening    02/25/2023    9:30 AM  Alcohol Use Disorder Test (AUDIT)  1. How often do you have a drink containing alcohol? 0  3. How often do you have six or more drinks on one occasion? 0   A score of 3 or more in women, and 4 or more in men indicates increased risk for alcohol abuse, EXCEPT if all of the points are from question 1   No results found for any visits on 07/28/23.  Assessment & Plan    Routine Health Maintenance and Physical Exam  Exercise Activities and Dietary recommendations  Goals      Appointments     Care Coordination Interventions: Patient  requested assistance with confirming all upcoming appointments.  Denies urgent concerns. She would like to discuss her current medication regimen for hypertension. Reports history of BP being well maintained with ziac and amlodipine. Reports tolerating the regimen well. She would like to discuss this with her Cardiology provider.  All pending appointments reviewed. Informed of pending appointment with the Cardiology team on 12/18/2021. Declined need for urgent outreach or messaging. She will follow up as scheduled. Requested assistance with rescheduling her mammogram and Dexscan. Will contact Outpatient Imaging and follow up this week.      Care Management     Current Barriers:  Care Management support and education needs related to   Planned Interventions:       Lab Results  Component Value Date   CHOL 187 04/08/2022   HDL 49 04/08/2022   LDLCALC 116 (H) 04/08/2022   TRIG 122 04/08/2022   CHOLHDL 3.8 04/08/2022     BP Readings from Last 3 Encounters:  12/13/22 138/61  11/29/22 (!) 129/58  06/02/22 (!) 158/64  DIET - EAT MORE FRUITS AND VEGETABLES     Exercise 3x per week (30 min per time)     Recommend to start walking 3 days a week for 30 minutes.         Immunization History  Administered Date(s) Administered   Fluad Quad(high Dose 65+) 02/20/2021, 04/08/2022   Fluad Trivalent(High Dose 65+) 03/30/2023   Influenza-Unspecified 03/05/2019   Moderna Sars-Covid-2 Vaccination 05/23/2019, 06/20/2019   Pneumococcal Conjugate-13 08/24/2013   Pneumococcal Polysaccharide-23 03/25/2015    Health Maintenance  Topic Date Due   Zoster Vaccines- Shingrix (1 of 2) 10/27/2023 (Originally 02/24/1998)   COVID-19 Vaccine (3 - 2024-25 season) 01/18/2024 (Originally 12/19/2022)   DTaP/Tdap/Td (1 - Tdap) 07/27/2024 (Originally 02/25/1967)   Colonoscopy  04/19/2026 (Originally 08/01/2016)   Medicare Annual Wellness (AWV)  10/05/2023   INFLUENZA VACCINE  11/18/2023   DEXA SCAN   05/18/2024   Pneumonia Vaccine 57+ Years old  Completed   Hepatitis C Screening  Completed   HPV VACCINES  Aged Out   Meningococcal B Vaccine  Aged Out    Discussed health benefits of physical activity, and encouraged her to engage in regular exercise appropriate for her age and condition.   Annual physical exam  Vitamin D deficiency -     VITAMIN D 25 Hydroxy (Vit-D Deficiency, Fractures)  Uncontrolled hypertension -     Comprehensive metabolic panel with GFR -     Ambulatory referral to Vascular Surgery  Elevated LDL cholesterol level -     Lipid panel  Prediabetes -     Hemoglobin A1c  Chronic kidney disease, stage 2, mildly decreased GFR -     Microalbumin / creatinine urine ratio -     Ambulatory referral to Vascular Surgery  Vitamin B12 deficiency -     Vitamin B12  Age-related osteoporosis without current pathological fracture Assessment & Plan: DEXA 04/2022: AP Spine L1-L2 12/08/2016 68.7 Osteoporosis -2.6 0.856 g/cm2 -2.4%   Plan to optimize calcium and vitamin D levels   Seasonal allergies -     Azelastine HCl; Place 1 spray into both nostrils at bedtime as needed for rhinitis or allergies. Use in each nostril as directed  Dispense: 30 mL; Refill: 1  Encounter for screening mammogram for breast cancer -     3D Screening Mammogram, Left and Right; Future  Insomnia, unspecified type     Annual physical exam Physical exam overall unremarkable except as noted above. Routine lab work ordered as noted.  COVID booster and shingles vaccine not received. No tetanus vaccine in the last ten years. Engages in regular physical activity and dietary mindfulness. Sleep disturbances due to work schedule. - Advise tetanus vaccine at pharmacy in May (due to patient preference). - Discuss COVID booster and shingles vaccine availability at pharmacy. - Encourage continued physical activity and dietary mindfulness. - Discuss regular sleep patterns and Ambien  use.  Uncontrolled hypertension Longstanding uncontrolled hypertension.  Managed with losartan, bisoprolol, amlodipine, and doxazosin. Concern for resistant hypertension and potential renal artery stenosis. - Refer to vascular for evaluation of renal artery stenosis. - Continue current antihypertensive regimen.  Insomnia Sleep disturbances managed with zolpidem 5 mg. Prescription discrepancy noted.  Sleep improving somewhat with recent increase in physical activity. - Verify zolpidem prescription with pharmacy. - Continue zolpidem 5 mg regimen.  Allergic Rhinitis Year-round allergies with congestion and headaches. Claritin provides some improvement. Open to azelastine nasal spray. - Prescribe azelastine nasal spray. - Advise taking Claritin at night. - Patient will be switching  to Allegra after today's visit.  Vitamin D Deficiency Low vitamin D levels managed with supplements. Vitamin D levels to be checked to determine need for high-dose prescription. - Check vitamin D levels with blood work. - Consider high-dose vitamin D prescription based on results, particularly in setting of osteoporosis.  Morbid obesity Counseled on lifestyle modifications.  Follow-up Advised to follow up with cardiology and vascular specialists for hypertension and potential renal artery stenosis evaluation. - Follow-up with cardiology in June. - Ensure vascular referral is completed.    Return in about 6 months (around 01/27/2024) for chronic f/u.     I discussed the assessment and treatment plan with the patient  The patient was provided an opportunity to ask questions and all were answered. The patient agreed with the plan and demonstrated an understanding of the instructions.   The patient was advised to call back or seek an in-person evaluation if the symptoms worsen or if the condition fails to improve as anticipated.    Sherlyn Hay, DO  [facility] [phone]  (phone) [phone] (fax)  [facility]

## 2023-07-28 NOTE — Assessment & Plan Note (Signed)
 DEXA 04/2022: AP Spine L1-L2 12/08/2016 68.7 Osteoporosis -2.6 0.856 g/cm2 -2.4%   Plan to optimize calcium and vitamin D levels

## 2023-07-29 LAB — COMPREHENSIVE METABOLIC PANEL WITH GFR
ALT: 11 IU/L (ref 0–32)
AST: 16 IU/L (ref 0–40)
Albumin: 4.2 g/dL (ref 3.8–4.8)
Alkaline Phosphatase: 117 IU/L (ref 44–121)
BUN/Creatinine Ratio: 9 — ABNORMAL LOW (ref 12–28)
BUN: 8 mg/dL (ref 8–27)
Bilirubin Total: 0.4 mg/dL (ref 0.0–1.2)
CO2: 21 mmol/L (ref 20–29)
Calcium: 9.4 mg/dL (ref 8.7–10.3)
Chloride: 104 mmol/L (ref 96–106)
Creatinine, Ser: 0.86 mg/dL (ref 0.57–1.00)
Globulin, Total: 2.7 g/dL (ref 1.5–4.5)
Glucose: 115 mg/dL — ABNORMAL HIGH (ref 70–99)
Potassium: 4.1 mmol/L (ref 3.5–5.2)
Sodium: 140 mmol/L (ref 134–144)
Total Protein: 6.9 g/dL (ref 6.0–8.5)
eGFR: 70 mL/min/{1.73_m2} (ref >59)

## 2023-07-29 LAB — LIPID PANEL
Chol/HDL Ratio: 3 ratio (ref 0.0–4.4)
Cholesterol, Total: 125 mg/dL (ref 100–199)
HDL: 41 mg/dL (ref >39)
LDL Chol Calc (NIH): 71 mg/dL (ref 0–99)
Triglycerides: 61 mg/dL (ref 0–149)
VLDL Cholesterol Cal: 13 mg/dL (ref 5–40)

## 2023-07-29 LAB — MICROALBUMIN / CREATININE URINE RATIO
Creatinine, Urine: 106.6 mg/dL
Microalb/Creat Ratio: 8 mg/g{creat} (ref 0–29)
Microalbumin, Urine: 8.7 ug/mL

## 2023-07-29 LAB — HEMOGLOBIN A1C
Est. average glucose Bld gHb Est-mCnc: 128 mg/dL
Hgb A1c MFr Bld: 6.1 % — ABNORMAL HIGH (ref 4.8–5.6)

## 2023-07-29 LAB — VITAMIN D 25 HYDROXY (VIT D DEFICIENCY, FRACTURES): Vit D, 25-Hydroxy: 44.9 ng/mL (ref 30.0–100.0)

## 2023-07-29 LAB — VITAMIN B12: Vitamin B-12: 223 pg/mL — ABNORMAL LOW (ref 232–1245)

## 2023-08-04 ENCOUNTER — Other Ambulatory Visit: Payer: Self-pay | Admitting: Cardiovascular Disease

## 2023-08-04 ENCOUNTER — Other Ambulatory Visit: Payer: Self-pay | Admitting: Cardiology

## 2023-08-07 ENCOUNTER — Encounter: Payer: Self-pay | Admitting: Family Medicine

## 2023-08-08 ENCOUNTER — Telehealth: Payer: Self-pay

## 2023-08-08 DIAGNOSIS — E538 Deficiency of other specified B group vitamins: Secondary | ICD-10-CM

## 2023-08-08 NOTE — Addendum Note (Signed)
 Addended by: Judyann Number on: 08/08/2023 09:17 PM   Modules accepted: Orders

## 2023-08-08 NOTE — Telephone Encounter (Signed)
 Copied from CRM [id number]. Topic: Clinical - Lab/Test Results >> Aug 08, 2023  4:35 PM Crispin Dolphin wrote: Reason for CRM: Patient called. Would like to know recent lab results. Read note as written by provider. Patient understood. Will get b12 otc because she does not like needles. She wants to make sure the specimen for the labs belonged to her and that there was not a mix up. Also had questions about why referral is needed for vein and vascular. Thank You

## 2023-08-10 NOTE — Telephone Encounter (Signed)
 LMTCB-ok for E2C2 to give patient provider's message.

## 2023-08-11 NOTE — Telephone Encounter (Signed)
 LMTCB Ok for E2C2 to give provider's message to patient

## 2023-08-16 NOTE — Telephone Encounter (Signed)
 LMTCB-We have called patient several times.

## 2023-08-19 NOTE — Telephone Encounter (Signed)
 Copied from CRM [id number]. Topic: Clinical - Lab/Test Results >> Aug 08, 2023  4:35 PM Crispin Dolphin wrote: Reason for CRM: Patient called. Would like to know recent lab results. Read note as written by provider. Patient understood. Will get b12 otc because she does not like needles. She wants to make sure the specimen for the labs belonged to her and that there was not a mix up. Also had questions about why referral is needed for vein and vascular. Thank You >> Aug 18, 2023 12:34 PM Lizabeth Riggs wrote: Melea called back and she is taking over the counter B 12. She does not have any questions about labs or referral to [facility]. She will call [facility] to make an appointment. No call back needed. Thanks

## 2023-08-20 ENCOUNTER — Other Ambulatory Visit: Payer: Self-pay | Admitting: Family Medicine

## 2023-08-20 DIAGNOSIS — J302 Other seasonal allergic rhinitis: Secondary | ICD-10-CM

## 2023-09-05 ENCOUNTER — Encounter (INDEPENDENT_AMBULATORY_CARE_PROVIDER_SITE_OTHER): Payer: Self-pay | Admitting: Nurse Practitioner

## 2023-09-06 ENCOUNTER — Encounter (INDEPENDENT_AMBULATORY_CARE_PROVIDER_SITE_OTHER): Payer: Self-pay

## 2023-09-23 ENCOUNTER — Encounter: Payer: Self-pay | Admitting: Cardiovascular Disease

## 2023-09-28 ENCOUNTER — Telehealth: Payer: Self-pay | Admitting: Family Medicine

## 2023-09-28 MED ORDER — LOSARTAN POTASSIUM 100 MG PO TABS: 100.0000 mg | ORAL_TABLET | Freq: Every day | ORAL | 1 refills | Status: DC

## 2023-09-28 NOTE — Addendum Note (Signed)
 Addended by: Darrow End on: 09/28/2023 10:55 AM   Modules accepted: Orders

## 2023-09-28 NOTE — Telephone Encounter (Signed)
[facility] faxed refill request for the following medications:   losartan (COZAAR) 100 MG tablet     Please advise

## 2023-10-01 ENCOUNTER — Other Ambulatory Visit: Payer: Self-pay | Admitting: Family Medicine

## 2023-10-01 DIAGNOSIS — I10 Essential (primary) hypertension: Secondary | ICD-10-CM

## 2023-10-10 ENCOUNTER — Ambulatory Visit (INDEPENDENT_AMBULATORY_CARE_PROVIDER_SITE_OTHER): Payer: No Typology Code available for payment source

## 2023-10-10 VITALS — Ht 62.0 in | Wt 196.0 lb

## 2023-10-10 DIAGNOSIS — Z0000 Encounter for general adult medical examination without abnormal findings: Secondary | ICD-10-CM | POA: Diagnosis not present

## 2023-10-10 NOTE — Progress Notes (Signed)
 Subjective:   Marissa Hahn is a 78 y.o. who presents for a Medicare Wellness preventive visit.  As a reminder, Annual Wellness Visits don't include a physical exam, and some assessments may be limited, especially if this visit is performed virtually. We may recommend an in-person follow-up visit with your provider if needed.  Visit Complete: Virtual I connected with  [name] on 10/10/23 by a audio enabled telemedicine application and verified that I am speaking with the correct person using two identifiers.  Patient Location: Home  Provider Location: Home Office  I discussed the limitations of evaluation and management by telemedicine. The patient expressed understanding and agreed to proceed.  Vital Signs: Because this visit was a virtual/telehealth visit, some criteria may be missing or patient reported. Any vitals not documented were not able to be obtained and vitals that have been documented are patient reported.  VideoDeclined- This patient declined Librarian, academic. Therefore the visit was completed with audio only.  Persons Participating in Visit: Patient.  AWV Questionnaire: No: Patient Medicare AWV questionnaire was not completed prior to this visit.  Cardiac Risk Factors include: advanced age (>31men, >40 women);dyslipidemia;hypertension     Objective:    Today's Vitals   10/10/23 1539  Weight: 196 lb (88.9 kg)  Height: 5' 2 (1.575 m)   Body mass index is 35.85 kg/m.     10/10/2023    3:44 PM 12/13/2022   10:36 AM 10/05/2022    3:47 PM 10/01/2021    3:33 PM 10/18/2019   10:46 AM 10/17/2018    2:31 PM 10/04/2017   10:40 AM  Advanced Directives  Does Patient Have a Medical Advance Directive? No Yes Yes No Yes Yes No   Type of Special educational needs teacher of [place];Living will Healthcare Power of [place];Living will  Healthcare Power of [employer] of [place];Living will   Does patient want to  make changes to medical advance directive?  No - Patient declined     No - Patient declined   Copy of Healthcare Power of Attorney in Chart?  Yes - validated most recent copy scanned in chart (See row information)   No - copy requested No - copy requested    Would patient like information on creating a medical advance directive? Yes (MAU/Ambulatory/Procedural Areas - Information given)   No - Patient declined   No - Patient declined      Data saved with a previous flowsheet row definition    Current Medications (verified) Outpatient Encounter Medications as of 10/10/2023  Medication Sig   amLODipine  (NORVASC ) 10 MG tablet TAKE 1 TABLET BY MOUTH EVERY DAY   Azelastine  HCl 137 MCG/SPRAY SOLN PLACE 1 SPRAY INTO BOTH NOSTRILS AT BEDTIME AS NEEDED FOR RHINITIS OR ALLERGIES. USE IN EACH NOSTRIL AS DIRECTED   bisoprolol  (ZEBETA ) 10 MG tablet TAKE 2 TABLETS DAILY   doxazosin  (CARDURA ) 1 MG tablet TAKE 1 TABLET TWICE A DAY   loratadine (CLARITIN) 10 MG tablet Take 10 mg by mouth daily.   losartan  (COZAAR ) 100 MG tablet Take 1 tablet (100 mg total) by mouth daily.   rosuvastatin  (CRESTOR ) 20 MG tablet Take 1 tablet (20 mg total) by mouth daily.   zolpidem  (AMBIEN ) 10 MG tablet Take 1 tablet (10 mg total) by mouth at bedtime as needed. for sleep (Patient taking differently: Take 5 mg by mouth at bedtime as needed for sleep. for sleep)   No facility-administered encounter medications on file as of 10/10/2023.  Allergies (verified) Acetaminophen-codeine   History: Past Medical History:  Diagnosis Date   Anxiety    Arthritis    Hyperlipidemia    Hypertension    Renal artery stenosis [facility])    Past Surgical History:  Procedure Laterality Date   ABDOMINAL HYSTERECTOMY  1990   Abdominal   CARPAL TUNNEL RELEASE Left 01/2010   CATARACT EXTRACTION W/PHACO Right 11/29/2022   Procedure: CATARACT EXTRACTION PHACO AND INTRAOCULAR LENS PLACEMENT (IOC) RIGHT  5.85  00:47.2;  Surgeon: Myrna Adine Anes,  MD;  [facility];  Service: Ophthalmology;  Laterality: Right;   CATARACT EXTRACTION W/PHACO Left 12/13/2022   Procedure: CATARACT EXTRACTION PHACO AND INTRAOCULAR LENS PLACEMENT (IOC) LEFT 4.64 00:38.7;  Surgeon: Myrna Adine Anes, MD;  Location: [facility];  Service: Ophthalmology;  Laterality: Left;   Family History  Problem Relation Age of Onset   Hypertension Mother    Lung cancer Father    Healthy Other    Hypertension Sister    Hypertension Sister    Hypertension Sister    Breast cancer Neg Hx    Social History   Socioeconomic History   Marital status: Divorced    Spouse name: Not on file   Number of children: 2   Years of education: H/S   Highest education level: 12th grade  Occupational History   Occupation: Retired  Tobacco Use   Smoking status: Never   Smokeless tobacco: Never  Vaping Use   Vaping status: Never Used  Substance and Sexual Activity   Alcohol use: Not Currently   Drug use: No   Sexual activity: Not on file  Other Topics Concern   Not on file  Social History Narrative   Not on file   Social Drivers of Health   Financial Resource Strain: Low Risk  (10/10/2023)   Overall Financial Resource Strain (CARDIA)    Difficulty of Paying Living Expenses: Not hard at all  Food Insecurity: No Food Insecurity (10/10/2023)   Hunger Vital Sign    Worried About Running Out of Food in the Last Year: Never true    Ran Out of Food in the Last Year: Never true  Transportation Needs: No Transportation Needs (10/10/2023)   PRAPARE - Administrator, Civil Service (Medical): No    Lack of Transportation (Non-Medical): No  Physical Activity: Insufficiently Active (10/10/2023)   Exercise Vital Sign    Days of Exercise per Week: 3 days    Minutes of Exercise per Session: 30 min  Stress: No Stress Concern Present (10/10/2023)   [employer] of Occupational Health - Occupational Stress Questionnaire    Feeling of Stress: Not at  all  Social Connections: Moderately Isolated (10/10/2023)   Social Connection and Isolation Panel    Frequency of Communication with Friends and Family: More than three times a week    Frequency of Social Gatherings with Friends and Family: Three times a week    Attends Religious Services: More than 4 times per year    Active Member of Clubs or Organizations: No    Attends Banker Meetings: Never    Marital Status: Divorced    Tobacco Counseling Counseling given: Not Answered    Clinical Intake:  Pre-visit preparation completed: Yes  Pain : No/denies pain     Diabetes: No  Lab Results  Component Value Date   HGBA1C 6.1 (H) 07/28/2023   HGBA1C 6.2 (H) 03/30/2023   HGBA1C 6.1 (H) 04/08/2022     How often  do you need to have someone help you when you read instructions, pamphlets, or other written materials from your doctor or pharmacy?: 1 - Never  Interpreter Needed?: No  Information entered by :: Charmaine Bloodgood LPN   Activities of Daily Living     10/10/2023    3:44 PM  In your present state of health, do you have any difficulty performing the following activities:  Hearing? 0  Vision? 0  Difficulty concentrating or making decisions? 0  Walking or climbing stairs? 0  Dressing or bathing? 0  Doing errands, shopping? 0  Preparing Food and eating ? N  Using the Toilet? N  In the past six months, have you accidently leaked urine? N  Do you have problems with loss of bowel control? N  Managing your Medications? N  Managing your Finances? N  Housekeeping or managing your Housekeeping? N    Patient Care Team: Pardue, Lauraine SAILOR, DO as PCP - General (Family Medicine) Perla Evalene PARAS, MD as PCP - Cardiology (Cardiology) Pa, [facility]  I have updated your Care Teams any recent Medical Services you may have received from other providers in the past year.     Assessment:   This is a routine wellness examination for Baby.  Hearing/Vision  screen Hearing Screening - Comments:: Denies hearing difficulties   Vision Screening - Comments:: Wears rx glasses - up to date with routine eye exams with [facility]    Goals Addressed             This Visit's Progress    DIET - EAT MORE FRUITS AND VEGETABLES   On track      Depression Screen     10/10/2023    3:43 PM 02/25/2023    9:30 AM 10/05/2022    3:45 PM 04/08/2022    4:12 PM 10/01/2021    3:30 PM 09/11/2021    1:52 PM 03/06/2021    9:39 PM  PHQ 2/9 Scores  PHQ - 2 Score 0 0 0 1 0 0 0  PHQ- 9 Score    8 1 1      Fall Risk     10/10/2023    3:44 PM 10/05/2022    3:40 PM 10/01/2021    3:34 PM 09/11/2021    1:51 PM 01/23/2021    1:17 PM  Fall Risk   Falls in the past year? 0 0 0 0 0  Number falls in past yr: 0 0 0 0 0  Injury with Fall? 0 0 0 0 0  Risk for fall due to : No Fall Risks No Fall Risks No Fall Risks No Fall Risks   Follow up Falls prevention discussed;Education provided;Falls evaluation completed Falls prevention discussed;Education provided Falls evaluation completed  Falls evaluation completed       Data saved with a previous flowsheet row definition    MEDICARE RISK AT HOME:  Medicare Risk at Home Any stairs in or around the home?: No If so, are there any without handrails?: No Home free of loose throw rugs in walkways, pet beds, electrical cords, etc?: Yes Adequate lighting in your home to reduce risk of falls?: Yes Life alert?: No Use of a cane, walker or w/c?: No Grab bars in the bathroom?: Yes Shower chair or bench in shower?: No Elevated toilet seat or a handicapped toilet?: Yes  TIMED UP AND GO:  Was the test performed?  No  Cognitive Function: 6CIT completed        10/10/2023  3:44 PM 10/05/2022    3:48 PM 10/01/2021    3:36 PM 10/05/2016    8:48 AM  6CIT Screen  What Year? 0 points 0 points 0 points 0 points  What month? 0 points 0 points 0 points 0 points  What time? 0 points 0 points 0 points 0 points  Count back from 20  0 points 0 points 0 points 0 points  Months in reverse 0 points 0 points 0 points 0 points  Repeat phrase 0 points 2 points 0 points 0 points  Total Score 0 points 2 points 0 points 0 points    Immunizations Immunization History  Administered Date(s) Administered   Fluad Quad(high Dose 65+) 02/20/2021, 04/08/2022   Fluad Trivalent(High Dose 65+) 03/30/2023   Influenza-Unspecified 03/05/2019   Moderna Sars-Covid-2 Vaccination 05/23/2019, 06/20/2019   Pneumococcal Conjugate-13 08/24/2013   Pneumococcal Polysaccharide-23 03/25/2015    Screening Tests Health Maintenance  Topic Date Due   Zoster Vaccines- Shingrix (1 of 2) 10/27/2023 (Originally 02/24/1998)   COVID-19 Vaccine (3 - 2024-25 season) 01/18/2024 (Originally 12/19/2022)   DTaP/Tdap/Td (1 - Tdap) 07/27/2024 (Originally 02/25/1967)   Colonoscopy  04/19/2026 (Originally 08/01/2016)   INFLUENZA VACCINE  11/18/2023   DEXA SCAN  05/18/2024   Medicare Annual Wellness (AWV)  10/09/2024   Pneumococcal Vaccine: 50+ Years  Completed   Hepatitis C Screening  Completed   HPV VACCINES  Aged Out   Meningococcal B Vaccine  Aged Out    Health Maintenance  There are no preventive care reminders to display for this patient.   Additional Screening:  Vision Screening: Recommended annual ophthalmology exams for early detection of glaucoma and other disorders of the eye. Would you like a referral to an eye doctor? No    Dental Screening: Recommended annual dental exams for proper oral hygiene  Community Resource Referral / Chronic Care Management: CRR required this visit?  No   CCM required this visit?  No   Plan:    I have personally reviewed and noted the following in the patient's chart:   Medical and social history Use of alcohol, tobacco or illicit drugs  Current medications and supplements including opioid prescriptions. Patient is not currently taking opioid prescriptions. Functional ability and status Nutritional  status Physical activity Advanced directives List of other physicians Hospitalizations, surgeries, and ER visits in previous 12 months Vitals Screenings to include cognitive, depression, and falls Referrals and appointments  In addition, I have reviewed and discussed with patient certain preventive protocols, quality metrics, and best practice recommendations. A written personalized care plan for preventive services as well as general preventive health recommendations were provided to patient.   Lavelle Pfeiffer [place], [place]   [id number]   After Visit Summary: (MyChart) Due to this being a telephonic visit, the after visit summary with patients personalized plan was offered to patient via MyChart   Notes: Nothing significant to report at this time.

## 2023-10-10 NOTE — Patient Instructions (Signed)
 Ms. Zender , Thank you for taking time out of your busy schedule to complete your Annual Wellness Visit with me. I enjoyed our conversation and look forward to speaking with you again next year. I, as well as your care team,  appreciate your ongoing commitment to your health goals. Please review the following plan we discussed and let me know if I can assist you in the future. Your Game plan/ To Do List     Follow up Visits: Next Medicare AWV with our clinical staff: In 1 year    Have you seen your provider in the last 6 months (3 months if uncontrolled diabetes)? Yes Next Office Visit with your provider: To be scheduled   Clinician Recommendations:  Aim for 30 minutes of exercise or brisk walking, 6-8 glasses of water, and 5 servings of fruits and vegetables each day.       This is a list of the screening recommended for you and due dates:  Health Maintenance  Topic Date Due   Zoster (Shingles) Vaccine (1 of 2) 10/27/2023*   COVID-19 Vaccine (3 - 2024-25 season) 01/18/2024*   DTaP/Tdap/Td vaccine (1 - Tdap) 07/27/2024*   Colon Cancer Screening  04/19/2026*   Flu Shot  11/18/2023   DEXA scan (bone density measurement)  05/18/2024   Medicare Annual Wellness Visit  10/09/2024   Pneumococcal Vaccine for age over 32  Completed   Hepatitis C Screening  Completed   HPV Vaccine  Aged Out   Meningitis B Vaccine  Aged Out  *Topic was postponed. The date shown is not the original due date.    Advanced directives: (ACP Link)Information on Advanced Care Planning can be found at [place]  Secretary of [facility] Advance Health Care Directives Advance Health Care Directives. [email or web address]   Advance Care Planning is important because it:  [x]  Makes sure you receive the medical care that is consistent with your values, goals, and preferences  [x]  It provides guidance to your family and loved ones and reduces their decisional burden about whether or not they are making the right decisions based on  your wishes.  Follow the link provided in your after visit summary or read over the paperwork we have mailed to you to help you started getting your Advance Directives in place. If you need assistance in completing these, please reach out to us  so that we can help you!  See attachments for Preventive Care and Fall Prevention Tips.

## 2023-10-14 ENCOUNTER — Telehealth: Payer: Self-pay | Admitting: Family Medicine

## 2023-10-14 ENCOUNTER — Other Ambulatory Visit: Payer: Self-pay

## 2023-10-14 DIAGNOSIS — F5101 Primary insomnia: Secondary | ICD-10-CM

## 2023-10-14 NOTE — Telephone Encounter (Signed)
Converted to refill request 

## 2023-10-14 NOTE — Telephone Encounter (Signed)
 [facility] faxed refill request for the following medications:   zolpidem  (AMBIEN ) 10 MG tablet    Please advise.

## 2023-10-24 ENCOUNTER — Telehealth: Payer: Self-pay | Admitting: Family Medicine

## 2023-10-24 DIAGNOSIS — F5101 Primary insomnia: Secondary | ICD-10-CM

## 2023-10-24 NOTE — Telephone Encounter (Signed)
 [facility] is requesting refill zolpidem (AMBIEN) 10 MG tablet  Please advise

## 2023-11-01 MED ORDER — ZOLPIDEM TARTRATE 10 MG PO TABS: 5.0000 mg | ORAL_TABLET | Freq: Every evening | ORAL | 1 refills | Status: DC | PRN

## 2024-01-12 DIAGNOSIS — Z961 Presence of intraocular lens: Secondary | ICD-10-CM | POA: Diagnosis not present

## 2024-01-29 ENCOUNTER — Other Ambulatory Visit: Payer: Self-pay | Admitting: Family Medicine

## 2024-02-03 ENCOUNTER — Encounter: Payer: Self-pay | Admitting: Family Medicine

## 2024-02-03 ENCOUNTER — Ambulatory Visit (INDEPENDENT_AMBULATORY_CARE_PROVIDER_SITE_OTHER): Admitting: Family Medicine

## 2024-02-03 VITALS — BP 181/67 | HR 62 | Temp 98.4°F | Ht 62.0 in | Wt 200.1 lb

## 2024-02-03 DIAGNOSIS — R7303 Prediabetes: Secondary | ICD-10-CM | POA: Diagnosis not present

## 2024-02-03 DIAGNOSIS — E7800 Pure hypercholesterolemia, unspecified: Secondary | ICD-10-CM

## 2024-02-03 DIAGNOSIS — H9191 Unspecified hearing loss, right ear: Secondary | ICD-10-CM

## 2024-02-03 DIAGNOSIS — E538 Deficiency of other specified B group vitamins: Secondary | ICD-10-CM | POA: Diagnosis not present

## 2024-02-03 DIAGNOSIS — J302 Other seasonal allergic rhinitis: Secondary | ICD-10-CM | POA: Diagnosis not present

## 2024-02-03 DIAGNOSIS — F5101 Primary insomnia: Secondary | ICD-10-CM

## 2024-02-03 DIAGNOSIS — E559 Vitamin D deficiency, unspecified: Secondary | ICD-10-CM | POA: Diagnosis not present

## 2024-02-03 DIAGNOSIS — I10 Essential (primary) hypertension: Secondary | ICD-10-CM | POA: Diagnosis not present

## 2024-02-03 DIAGNOSIS — Z23 Encounter for immunization: Secondary | ICD-10-CM | POA: Diagnosis not present

## 2024-02-03 MED ORDER — ZOLPIDEM TARTRATE 10 MG PO TABS: 5.0000 mg | ORAL_TABLET | Freq: Every evening | ORAL | 1 refills | Status: DC | PRN

## 2024-02-03 MED ORDER — FLUTICASONE PROPIONATE 50 MCG/ACT NA SUSP: 2.0000 | Freq: Every day | NASAL | 12 refills | Status: AC

## 2024-02-03 NOTE — Patient Instructions (Addendum)
 Recommended vaccine to get at pharmacy: Shingrix (shingles).  May consider switching to (cetirizine) zyrtec or xyzal (levocetirizine).

## 2024-02-03 NOTE — Progress Notes (Signed)
 Established patient visit   [name]   DOB: 09-12-1945   78 y.o. Female  MRN: [id number] Visit Date: 02/03/2024  Today's healthcare provider: LAURAINE LOISE BUOY, DO   Chief Complaint  Patient presents with   Medication Refill    Patient is just here for medication refills, wants to discuss treatment regarding her ears.  Stated when she went to ENT in [place] she didn't find anything out.  Concerned that it may need to be cleaned out.  Flu Vaccine-yes Shingles- declined   Subjective    Medication Refill  Marissa Hahn is a 78 year old female who presents with elevated blood pressure and hearing issues.  She experienced an episode of elevated blood pressure today, which she attributes to not having taken her medications after work and not having eaten. She monitors her blood pressure at home.  Denies chest pain, shortness of breath, headache, dizziness and lightheadedness.  She is experiencing hearing difficulties, particularly in one ear, which she believes may be related to allergies. She describes the sensation as her left ear feeling 'stopped up' and compares it to the feeling of water in the ear after swimming. She has been evaluated by an ENT who performed a hearing test and noted that right ear was worse than the other. She has been using over-the-counter allergy medications, including Claritin and Allegra, and finds Allegra to be more effective.   She has been taking vitamin B12 since April and continues to do so, as it seems to help with her energy levels. She also takes vitamin D  and has been doing so for several years. No dizziness is reported. She experiences occasional shoulder pain.      Medications: Outpatient Medications Prior to Visit  Medication Sig   amLODipine  (NORVASC ) 10 MG tablet TAKE 1 TABLET BY MOUTH EVERY DAY   bisoprolol  (ZEBETA ) 10 MG tablet TAKE 2 TABLETS DAILY   cholecalciferol (VITAMIN D3) 25 MCG (1000 UNIT) tablet Take  1,000 Units by mouth daily.   cyanocobalamin  (VITAMIN B12) 1000 MCG tablet Take 1,000 mcg by mouth daily.   doxazosin  (CARDURA ) 1 MG tablet TAKE 1 TABLET TWICE A DAY   loratadine (CLARITIN) 10 MG tablet Take 10 mg by mouth daily.   losartan  (COZAAR ) 100 MG tablet Take 1 tablet (100 mg total) by mouth daily.   rosuvastatin  (CRESTOR ) 20 MG tablet Take 1 tablet (20 mg total) by mouth daily.   [DISCONTINUED] zolpidem  (AMBIEN ) 10 MG tablet Take 0.5 tablets (5 mg total) by mouth at bedtime as needed for sleep.   [DISCONTINUED] Azelastine  HCl 137 MCG/SPRAY SOLN PLACE 1 SPRAY INTO BOTH NOSTRILS AT BEDTIME AS NEEDED FOR RHINITIS OR ALLERGIES. USE IN EACH NOSTRIL AS DIRECTED   No facility-administered medications prior to visit.        Objective    BP (!) 181/67 (BP Location: Right Arm, Patient Position: Sitting, Cuff Size: Large)   Pulse 62   Temp 98.4 F (36.9 C) (Oral)   Ht 5' 2 (1.575 m)   Wt 200 lb 1.6 oz (90.8 kg)   SpO2 97%   BMI 36.60 kg/m     Physical Exam Vitals and nursing note reviewed.  Constitutional:      General: She is not in acute distress.    Appearance: Normal appearance.  HENT:     Head: Normocephalic and atraumatic.     Right Ear: Ear canal and external ear normal. Decreased hearing noted. A middle ear effusion (clear)  is present. Tympanic membrane is not bulging.     Left Ear: Hearing, tympanic membrane, ear canal and external ear normal.  Eyes:     General: No scleral icterus.    Conjunctiva/sclera: Conjunctivae normal.  Cardiovascular:     Rate and Rhythm: Normal rate.  Pulmonary:     Effort: Pulmonary effort is normal.  Neurological:     Mental Status: She is alert and oriented to person, place, and time. Mental status is at baseline.  Psychiatric:        Mood and Affect: Mood normal.        Behavior: Behavior normal.      Results for orders placed or performed in visit on 02/03/24  Comprehensive metabolic panel with GFR  Result Value Ref Range    Glucose 113 (H) 70 - 99 mg/dL   BUN 8 8 - 27 mg/dL   Creatinine, Ser 9.19 0.57 - 1.00 mg/dL   eGFR 77 >40 fO/fpw/8.26   BUN/Creatinine Ratio 10 (L) 12 - 28   Sodium 138 134 - 144 mmol/L   Potassium 4.4 3.5 - 5.2 mmol/L   Chloride 101 96 - 106 mmol/L   CO2 22 20 - 29 mmol/L   Calcium  9.5 8.7 - 10.3 mg/dL   Total Protein 7.5 6.0 - 8.5 g/dL   Albumin 4.1 3.8 - 4.8 g/dL   Globulin, Total 3.4 1.5 - 4.5 g/dL   Bilirubin Total 0.6 0.0 - 1.2 mg/dL   Alkaline Phosphatase 116 49 - 135 IU/L   AST 19 0 - 40 IU/L   ALT 14 0 - 32 IU/L  Hemoglobin A1c  Result Value Ref Range   Hgb A1c MFr Bld 6.1 (H) 4.8 - 5.6 %   Est. average glucose Bld gHb Est-mCnc 128 mg/dL  Lipid panel  Result Value Ref Range   Cholesterol, Total 136 100 - 199 mg/dL   Triglycerides 83 0 - 149 mg/dL   HDL 47 >60 mg/dL   VLDL Cholesterol Cal 16 5 - 40 mg/dL   LDL Chol Calc (NIH) 73 0 - 99 mg/dL   Chol/HDL Ratio 2.9 0.0 - 4.4 ratio  VITAMIN D  25 Hydroxy (Vit-D Deficiency, Fractures)  Result Value Ref Range   Vit D, 25-Hydroxy 34.2 30.0 - 100.0 ng/mL  Vitamin B12  Result Value Ref Range   Vitamin B-12 919 232 - 1,245 pg/mL    Assessment & Plan    Uncontrolled hypertension -     Comprehensive metabolic panel with GFR  Hearing loss of right ear, unspecified hearing loss type  Seasonal allergies -     Fluticasone  Propionate; Place 2 sprays into both nostrils daily.  Dispense: 16 g; Refill: 12  Vitamin D  deficiency -     VITAMIN D  25 Hydroxy (Vit-D Deficiency, Fractures)  Primary insomnia -     Zolpidem  Tartrate; Take 0.5 tablets (5 mg total) by mouth at bedtime as needed for sleep.  Dispense: 45 tablet; Refill: 1  Elevated LDL cholesterol level -     Lipid panel  Prediabetes -     Hemoglobin A1c  Vitamin B12 deficiency -     Vitamin B12  Need for influenza vaccination -     Flu vaccine HIGH DOSE PF(Fluzone Trivalent)      Uncontrolled hypertension Chronic, worsened.  Elevated blood pressure  possibly due to missed medication doses. Monitors blood pressure at home. - Counseled patient on medication adherence. - Continue amlodipine  10 mg daily, losartan  100 mg daily, and bisoprolol  20 mg daily. -  Patient to follow-up in 4 weeks for recheck; consider adding additional medication for better blood pressure control.  Seasonal allergies Chronic allergic rhinitis with ear fullness and hearing difficulties, likely due to fluid behind the eardrum. Allegra provides better relief than Claritin. Has not tried Zyrtec or Xyzal. Nasal spray previously helped with allergy-related headaches. - Recommend trying Zyrtec or Xyzal as alternatives to Claritin. - Send prescription for Fluticasone  (Flonase ) nasal spray.  Hearing loss of right ear, unspecified hearing loss type Hearing loss noted after audiology evaluation to be present in the right ear, the patient's experience is that the left ear is worse, possibly related to allergies causing fluid behind the eardrum. - Consider over-the-counter hearing aids for assistance or discuss prescription hearing aids with ENT.  Primary insomnia Requires a refill of Ambien . There was a mix-up with the pharmacy regarding the refill process. - Send Ambien  prescription to [facility].  General Health Maintenance Due for a flu shot. - Administer flu shot. - Order routine blood work.    Return in about 4 weeks (around 03/02/2024) for HTN.      I discussed the assessment and treatment plan with the patient  The patient was provided an opportunity to ask questions and all were answered. The patient agreed with the plan and demonstrated an understanding of the instructions.   The patient was advised to call back or seek an in-person evaluation if the symptoms worsen or if the condition fails to improve as anticipated.    LAURAINE LOISE BUOY, DO  [facility] [phone] (phone) [phone] (fax)  [facility]

## 2024-02-04 LAB — COMPREHENSIVE METABOLIC PANEL WITH GFR
ALT: 14 IU/L (ref 0–32)
AST: 19 IU/L (ref 0–40)
Albumin: 4.1 g/dL (ref 3.8–4.8)
Alkaline Phosphatase: 116 IU/L (ref 49–135)
BUN/Creatinine Ratio: 10 — ABNORMAL LOW (ref 12–28)
BUN: 8 mg/dL (ref 8–27)
Bilirubin Total: 0.6 mg/dL (ref 0.0–1.2)
CO2: 22 mmol/L (ref 20–29)
Calcium: 9.5 mg/dL (ref 8.7–10.3)
Chloride: 101 mmol/L (ref 96–106)
Creatinine, Ser: 0.8 mg/dL (ref 0.57–1.00)
Globulin, Total: 3.4 g/dL (ref 1.5–4.5)
Glucose: 113 mg/dL — ABNORMAL HIGH (ref 70–99)
Potassium: 4.4 mmol/L (ref 3.5–5.2)
Sodium: 138 mmol/L (ref 134–144)
Total Protein: 7.5 g/dL (ref 6.0–8.5)
eGFR: 77 mL/min/1.73 (ref >59)

## 2024-02-04 LAB — LIPID PANEL
Chol/HDL Ratio: 2.9 ratio (ref 0.0–4.4)
Cholesterol, Total: 136 mg/dL (ref 100–199)
HDL: 47 mg/dL (ref >39)
LDL Chol Calc (NIH): 73 mg/dL (ref 0–99)
Triglycerides: 83 mg/dL (ref 0–149)
VLDL Cholesterol Cal: 16 mg/dL (ref 5–40)

## 2024-02-04 LAB — HEMOGLOBIN A1C
Est. average glucose Bld gHb Est-mCnc: 128 mg/dL
Hgb A1c MFr Bld: 6.1 % — ABNORMAL HIGH (ref 4.8–5.6)

## 2024-02-04 LAB — VITAMIN D 25 HYDROXY (VIT D DEFICIENCY, FRACTURES): Vit D, 25-Hydroxy: 34.2 ng/mL (ref 30.0–100.0)

## 2024-02-04 LAB — VITAMIN B12: Vitamin B-12: 919 pg/mL (ref 232–1245)

## 2024-02-06 ENCOUNTER — Encounter: Payer: Self-pay | Admitting: Family Medicine

## 2024-02-06 ENCOUNTER — Ambulatory Visit: Payer: Self-pay | Admitting: Family Medicine

## 2024-02-07 ENCOUNTER — Other Ambulatory Visit (HOSPITAL_COMMUNITY): Payer: Self-pay

## 2024-02-08 ENCOUNTER — Other Ambulatory Visit (HOSPITAL_COMMUNITY): Payer: Self-pay

## 2024-02-08 ENCOUNTER — Telehealth: Payer: Self-pay | Admitting: Pharmacy Technician

## 2024-02-08 NOTE — Telephone Encounter (Signed)
 Pharmacy Patient Advocate Encounter  Received notification from [facility] MEDICARE that Prior Authorization for Zolpidem  10mg  tablets has been DENIED.  Full denial letter will be uploaded to the media tab. See denial reason below.   PA #/Case ID/Reference #: [id number]

## 2024-02-08 NOTE — Telephone Encounter (Signed)
 Pharmacy Patient Advocate Encounter   Received notification from Onbase that prior authorization for Zolpidem  10mg  tablets is required/requested.   Insurance verification completed.   The patient is insured through [facility] MEDICARE   Per test claim: PA required; PA submitted to above mentioned insurance via Latent Key/confirmation #/EOC [id number] Status is pending

## 2024-02-12 ENCOUNTER — Other Ambulatory Visit: Payer: Self-pay | Admitting: Cardiovascular Disease

## 2024-02-14 MED ORDER — DOXAZOSIN MESYLATE 1 MG PO TABS: 1.0000 mg | ORAL_TABLET | Freq: Two times a day (BID) | ORAL | 0 refills | Status: DC

## 2024-02-15 ENCOUNTER — Telehealth: Payer: Self-pay

## 2024-02-15 NOTE — Telephone Encounter (Unsigned)
 Copied from CRM #[id number]. Topic: General - Other >> Feb 15, 2024  3:26 PM Charlet HERO wrote: Reason for CRM: Patient calling to speak to [facility], per the notes she told her to call back and that she will try back later. Informed the patient

## 2024-02-15 NOTE — Telephone Encounter (Signed)
 LVM to call office back, will try again later and also send a MyChart message to the patient.

## 2024-02-16 NOTE — Telephone Encounter (Signed)
 Sent patient a MyChart message regarding provider's advice.

## 2024-02-16 NOTE — Telephone Encounter (Signed)
 Spoke with pt regarding test results. Pt verbalized understanding.

## 2024-02-16 NOTE — Progress Notes (Signed)
 ANNEKE CUNDY                                          MRN: [id number]   02/16/2024   The [facility] Quality Team Specialist reviewed this patient medical record for the purposes of chart review for care gap closure. The following were reviewed: chart review for care gap closure-controlling blood pressure.    [facility]BCI Quality Team

## 2024-02-29 ENCOUNTER — Telehealth: Payer: Self-pay | Admitting: Family Medicine

## 2024-02-29 DIAGNOSIS — I10 Essential (primary) hypertension: Secondary | ICD-10-CM

## 2024-02-29 NOTE — Telephone Encounter (Signed)
 Copied from CRM [id number]. Topic: Clinical - Prescription Issue >> Feb 29, 2024 10:29 AM Rosaria BRAVO wrote: Reason for CRM: Pt called requesting that her PCP releases her bisoprolol  (ZEBETA ) 10 MG tablet, they're waiting on her authorization   [id number] [place], [place] - [address] [place] [address] Phone: [phone] Fax: [phone]  Says she will be out before the weekend.

## 2024-03-03 MED ORDER — BISOPROLOL FUMARATE 10 MG PO TABS: 20.0000 mg | ORAL_TABLET | Freq: Every day | ORAL | 0 refills | Status: DC

## 2024-03-03 NOTE — Addendum Note (Signed)
 Addended by: DONZELLA DOMINO on: 03/03/2024 12:38 PM   Modules accepted: Orders

## 2024-03-07 NOTE — Telephone Encounter (Signed)
 Noted

## 2024-03-07 NOTE — Telephone Encounter (Signed)
 Called pt with no answer, lvm to return call so we can advised her on provider note. Also lvm regarding a courtesy refill was done but she needs to contact our office.

## 2024-03-07 NOTE — Telephone Encounter (Signed)
 Pt returned call and was provided with Dr. Tresia contact information [phone]

## 2024-03-11 ENCOUNTER — Other Ambulatory Visit: Payer: Self-pay | Admitting: Family Medicine

## 2024-03-11 DIAGNOSIS — I10 Essential (primary) hypertension: Secondary | ICD-10-CM

## 2024-03-19 NOTE — Progress Notes (Unsigned)
 Cardiology Office Note  Date:  03/20/2024   ID:  Marissa Hahn, DOB [birth year], MRN [id number]  PCP:  Marissa Lauraine SAILOR, DO   Chief Complaint  Patient presents with   Follow up     Patient c/o fluctuating blood pressure.     HPI:  [name] is a 78 year old woman with past medical history of Hypertension Elevated glucose A1c 6.1  hyperlipidemia Who presents for follow-up of her hypertension  Last seen by myself in clinic 2/24 In follow-up today reports doing well On discussion of her blood pressures, reports they typically run 130 up to 140 systolic at home, rare numbers on higher Reports that she writes down her numbers at home but did not bring them in today  Denies shortness of breath or chest pain on exertion  Continues to work daily at [employer] several hours a day Tries to stay active Trace lower extremity swelling when on her feet, typically resolves without intervention  Renal artery ultrasound previously but has not been completed  Lab work reviewed Total chol 136, LDL 73 A1C 6.1 Low Vit D  EKG personally reviewed by myself on todays visit EKG Interpretation Date/Time:  Tuesday March 20 2024 08:42:53 EST Ventricular Rate:  62 PR Interval:  174 QRS Duration:  74 QT Interval:  412 QTC Calculation: 418 R Axis:   2  Text Interpretation: Normal sinus rhythm Normal ECG When compared with ECG of 20-Mar-2024 08:42, No significant change was found Confirmed by Perla Lye [phone]) on 03/20/2024 8:46:45 AM    Tolerating amlodipine  10 daily, bisoprolol  10 twice daily, losartan  100 daily Cardura  1 twice daily Tolerating Crestor  20 daily   PMH:   has a past medical history of Anxiety, Arthritis, Hyperlipidemia, Hypertension, and Renal artery stenosis.  PSH:    Past Surgical History:  Procedure Laterality Date   ABDOMINAL HYSTERECTOMY  1990   Abdominal   CARPAL TUNNEL RELEASE Left 01/2010   CATARACT EXTRACTION W/PHACO Right 11/29/2022    Procedure: CATARACT EXTRACTION PHACO AND INTRAOCULAR LENS PLACEMENT (IOC) RIGHT  5.85  00:47.2;  Surgeon: Myrna Adine Anes, MD;  Location: [facility];  Service: Ophthalmology;  Laterality: Right;   CATARACT EXTRACTION W/PHACO Left 12/13/2022   Procedure: CATARACT EXTRACTION PHACO AND INTRAOCULAR LENS PLACEMENT (IOC) LEFT 4.64 00:38.7;  Surgeon: Myrna Adine Anes, MD;  Location: [facility];  Service: Ophthalmology;  Laterality: Left;    Current Outpatient Medications  Medication Sig Dispense Refill   amLODipine  (NORVASC ) 10 MG tablet TAKE 1 TABLET BY MOUTH EVERY DAY 90 tablet 1   bisoprolol  (ZEBETA ) 10 MG tablet TAKE 2 TABLETS (20 MG TOTAL) BY MOUTH DAILY. SHORT TERM REFILL. NEED APPOINTMENT W/DR. Nayali Talerico 180 tablet 1   cholecalciferol (VITAMIN D3) 25 MCG (1000 UNIT) tablet Take 1,000 Units by mouth daily.     cyanocobalamin  (VITAMIN B12) 1000 MCG tablet Take 1,000 mcg by mouth daily.     doxazosin  (CARDURA ) 1 MG tablet Take 1 tablet (1 mg total) by mouth 2 (two) times daily. 30 tablet 0   fluticasone  (FLONASE ) 50 MCG/ACT nasal spray Place 2 sprays into both nostrils daily. 16 g 12   loratadine (CLARITIN) 10 MG tablet Take 10 mg by mouth daily.     losartan  (COZAAR ) 100 MG tablet Take 1 tablet (100 mg total) by mouth daily. 90 tablet 1   rosuvastatin  (CRESTOR ) 20 MG tablet Take 1 tablet (20 mg total) by mouth daily. 90 tablet 3   zolpidem  (AMBIEN ) 10 MG tablet Take  0.5 tablets (5 mg total) by mouth at bedtime as needed for sleep. 45 tablet 1   No current facility-administered medications for this visit.    Allergies:   Acetaminophen-codeine   Social History:  The patient  reports that she has never smoked. She has never used smokeless tobacco. She reports that she does not currently use alcohol. She reports that she does not use drugs.   Family History:   family history includes Healthy in an other family member; Hypertension in her mother, sister, sister, and sister; Lung  cancer in her father.    Review of Systems: Review of Systems  Constitutional: Negative.   HENT: Negative.    Respiratory: Negative.    Cardiovascular: Negative.   Gastrointestinal: Negative.   Musculoskeletal: Negative.   Neurological: Negative.   Psychiatric/Behavioral: Negative.    All other systems reviewed and are negative.   PHYSICAL EXAM: VS:  BP (!) 142/70 (BP Location: Right Arm, Patient Position: Sitting, Cuff Size: Large)   Pulse 62   Ht 5' 2 (1.575 m)   Wt 199 lb 6 oz (90.4 kg)   SpO2 97%   BMI 36.47 kg/m  , BMI Body mass index is 36.47 kg/m. Constitutional:  oriented to person, place, and time. No distress.  HENT:  Head: Normocephalic and atraumatic.  Eyes:  no discharge. No scleral icterus.  Neck: Normal range of motion. Neck supple. No JVD present.  Cardiovascular: Normal rate, regular rhythm, normal heart sounds and intact distal pulses. Exam reveals no gallop and no friction rub. No edema No murmur heard. Pulmonary/Chest: Effort normal and breath sounds normal. No stridor. No respiratory distress.  no wheezes.  no rales.  no tenderness.  Abdominal: Soft.  no distension.  no tenderness.  Musculoskeletal: Normal range of motion.  no  tenderness or deformity.  Neurological:  normal muscle tone. Coordination normal. No atrophy Skin: Skin is warm and dry. No rash noted. not diaphoretic.  Psychiatric:  normal mood and affect. behavior is normal. Thought content normal.     Recent Labs: 03/30/2023: Hemoglobin 12.3; Platelets 337; TSH 1.930 02/03/2024: ALT 14; BUN 8; Creatinine, Ser 0.80; Potassium 4.4; Sodium 138    Lipid Panel Lab Results  Component Value Date   CHOL 136 02/03/2024   HDL 47 02/03/2024   LDLCALC 73 02/03/2024   TRIG 83 02/03/2024    Wt Readings from Last 3 Encounters:  03/20/24 199 lb 6 oz (90.4 kg)  02/03/24 200 lb 1.6 oz (90.8 kg)  10/10/23 196 lb (88.9 kg)     ASSESSMENT AND PLAN:  Problem List Items Addressed This Visit        Cardiology Problems   Uncontrolled hypertension - Primary   Relevant Orders   EKG 12-Lead (Completed)     Other   Obesity, morbid (HCC)   Elevated LDL cholesterol level   Prediabetes   Essential hypertension Reports systolic pressures 130s up to 859, recommend she continue to write down her blood pressure numbers and bring them in to her doctor visits If blood pressure runs high, Cardura  dosing could be increased up to 2 mg twice daily  Elevated glucose September 2021 A1c 6.1, stable Recommend she continue walking program  Hyperlipidemia Tolerating Crestor  20 daily, cholesterol at goal  Preventive care Denies chest pain Non-smoker, A1c relatively well-controlled, cholesterol controlled Could consider CT coronary calcium  scoring if desired though no strong indication for testing     Signed, Velinda Lunger, M.D., Ph.D. [facility] [place], [place] [phone]

## 2024-03-20 ENCOUNTER — Ambulatory Visit: Attending: Cardiovascular Disease | Admitting: Cardiovascular Disease

## 2024-03-20 ENCOUNTER — Encounter: Payer: Self-pay | Admitting: Cardiovascular Disease

## 2024-03-20 VITALS — BP 142/70 | HR 62 | Ht 62.0 in | Wt 199.4 lb

## 2024-03-20 DIAGNOSIS — I10 Essential (primary) hypertension: Secondary | ICD-10-CM

## 2024-03-20 DIAGNOSIS — E7800 Pure hypercholesterolemia, unspecified: Secondary | ICD-10-CM

## 2024-03-20 DIAGNOSIS — R7303 Prediabetes: Secondary | ICD-10-CM

## 2024-03-20 MED ORDER — BISOPROLOL FUMARATE 10 MG PO TABS: 20.0000 mg | ORAL_TABLET | Freq: Every day | ORAL | 3 refills | Status: AC

## 2024-03-20 MED ORDER — DOXAZOSIN MESYLATE 1 MG PO TABS: 1.0000 mg | ORAL_TABLET | Freq: Two times a day (BID) | ORAL | 3 refills | Status: AC

## 2024-03-20 NOTE — Patient Instructions (Addendum)

## 2024-04-01 ENCOUNTER — Other Ambulatory Visit: Payer: Self-pay | Admitting: Family Medicine

## 2024-04-01 DIAGNOSIS — I10 Essential (primary) hypertension: Secondary | ICD-10-CM

## 2024-04-04 ENCOUNTER — Other Ambulatory Visit: Payer: Self-pay

## 2024-04-04 ENCOUNTER — Telehealth: Payer: Self-pay | Admitting: Family Medicine

## 2024-04-04 DIAGNOSIS — I10 Essential (primary) hypertension: Secondary | ICD-10-CM

## 2024-04-04 MED ORDER — LOSARTAN POTASSIUM 100 MG PO TABS: 100.0000 mg | ORAL_TABLET | Freq: Every day | ORAL | 0 refills | Status: AC

## 2024-04-04 NOTE — Telephone Encounter (Signed)
 Refill Request   [facility]  Medication: losartan  (COZAAR ) 100 MG tablet [22588]  Please send in refill request.

## 2024-04-04 NOTE — Telephone Encounter (Signed)
 Converted to rx request

## 2024-05-06 ENCOUNTER — Other Ambulatory Visit: Payer: Self-pay | Admitting: Family Medicine

## 2024-05-08 ENCOUNTER — Other Ambulatory Visit: Payer: Self-pay | Admitting: Family Medicine

## 2024-05-08 DIAGNOSIS — F5101 Primary insomnia: Secondary | ICD-10-CM

## 2024-05-10 MED ORDER — ZOLPIDEM TARTRATE 10 MG PO TABS: 5.0000 mg | ORAL_TABLET | Freq: Every evening | ORAL | 1 refills | Status: AC | PRN

## 2024-05-10 NOTE — Addendum Note (Signed)
 Addended byBETHA DONZELLA DOMINO on: 05/10/2024 04:00 PM   Modules accepted: Orders

## 2024-05-10 NOTE — Telephone Encounter (Signed)
 Patient still had a prescription on file which was valid, for which reason I declined the original request for refill.  I contacted [facility] mail service, to which the valid prescription (originally sent 02/03/2024) was sent.  There was some uncertainty with being able to fill it despite the prescription being able to be located, so I requested the prescription be discontinued, which the representative, Katheren, indicated they were able to do.  I will go ahead and send a new prescription as noted.

## 2024-05-23 NOTE — Progress Notes (Signed)
 Marissa Hahn                                          MRN: [id number]   05/23/2024   The [facility]BCI Quality Team Specialist reviewed this patient medical record for the purposes of chart review for care gap closure. The following were reviewed: chart review for care gap closure-controlling blood pressure.    VBCI Quality Team

## 2024-10-16 ENCOUNTER — Ambulatory Visit
